# Patient Record
Sex: Female | Born: 1980
Health system: Southern US, Community
[De-identification: ages and names within clinical notes are randomized; demographics above are authoritative.]

## PROBLEM LIST (undated history)

## (undated) DIAGNOSIS — F329 Major depressive disorder, single episode, unspecified: Secondary | ICD-10-CM

## (undated) DIAGNOSIS — K319 Disease of stomach and duodenum, unspecified: Secondary | ICD-10-CM

## (undated) DIAGNOSIS — F32A Depression, unspecified: Secondary | ICD-10-CM

## (undated) DIAGNOSIS — F419 Anxiety disorder, unspecified: Secondary | ICD-10-CM

## (undated) HISTORY — PX: ABDOMINAL HYSTERECTOMY: SHX81

## (undated) HISTORY — DX: Anxiety disorder, unspecified: F41.9

---
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
## 2001-05-13 ENCOUNTER — Other Ambulatory Visit: Admission: RE | Admit: 2001-05-13 | Discharge: 2001-05-13 | Payer: Self-pay | Admitting: Obstetrics and Gynecology

## 2002-05-14 ENCOUNTER — Emergency Department (HOSPITAL_COMMUNITY): Admission: EM | Admit: 2002-05-14 | Discharge: 2002-05-14 | Payer: Self-pay | Admitting: Emergency Medicine

## 2003-07-08 ENCOUNTER — Emergency Department (HOSPITAL_COMMUNITY): Admission: EM | Admit: 2003-07-08 | Discharge: 2003-07-09 | Payer: Self-pay | Admitting: Emergency Medicine

## 2003-07-09 ENCOUNTER — Encounter: Payer: Self-pay | Admitting: Emergency Medicine

## 2005-03-23 ENCOUNTER — Ambulatory Visit: Payer: Self-pay | Admitting: Family Medicine

## 2005-04-09 ENCOUNTER — Ambulatory Visit: Payer: Self-pay | Admitting: Family Medicine

## 2005-04-23 ENCOUNTER — Encounter: Admission: RE | Admit: 2005-04-23 | Discharge: 2005-04-23 | Payer: Self-pay | Admitting: Family Medicine

## 2006-08-27 ENCOUNTER — Ambulatory Visit: Payer: Self-pay | Admitting: Family Medicine

## 2006-09-06 ENCOUNTER — Ambulatory Visit: Payer: Self-pay | Admitting: Family Medicine

## 2007-11-22 DIAGNOSIS — H538 Other visual disturbances: Secondary | ICD-10-CM | POA: Insufficient documentation

## 2007-12-01 ENCOUNTER — Ambulatory Visit: Payer: Self-pay | Admitting: Family Medicine

## 2007-12-04 ENCOUNTER — Encounter: Admission: RE | Admit: 2007-12-04 | Discharge: 2007-12-04 | Payer: Self-pay | Admitting: Family Medicine

## 2007-12-05 ENCOUNTER — Telehealth: Payer: Self-pay | Admitting: Family Medicine

## 2008-06-26 ENCOUNTER — Emergency Department (HOSPITAL_COMMUNITY): Admission: EM | Admit: 2008-06-26 | Discharge: 2008-06-27 | Payer: Self-pay | Admitting: Emergency Medicine

## 2008-07-05 ENCOUNTER — Encounter: Admission: RE | Admit: 2008-07-05 | Discharge: 2008-07-05 | Payer: Self-pay | Admitting: Neurology

## 2008-07-08 ENCOUNTER — Encounter: Admission: RE | Admit: 2008-07-08 | Discharge: 2008-07-08 | Payer: Self-pay | Admitting: Internal Medicine

## 2009-08-12 ENCOUNTER — Inpatient Hospital Stay (HOSPITAL_COMMUNITY): Admission: AD | Admit: 2009-08-12 | Discharge: 2009-08-12 | Payer: Self-pay | Admitting: Obstetrics and Gynecology

## 2009-08-12 ENCOUNTER — Encounter (INDEPENDENT_AMBULATORY_CARE_PROVIDER_SITE_OTHER): Payer: Self-pay | Admitting: Obstetrics and Gynecology

## 2010-06-21 ENCOUNTER — Encounter: Admission: RE | Admit: 2010-06-21 | Discharge: 2010-06-22 | Payer: Self-pay | Admitting: Obstetrics and Gynecology

## 2010-09-07 ENCOUNTER — Inpatient Hospital Stay (HOSPITAL_COMMUNITY): Admission: AD | Admit: 2010-09-07 | Discharge: 2010-09-11 | Payer: Self-pay | Admitting: Obstetrics and Gynecology

## 2011-03-15 LAB — RPR: RPR Ser Ql: NONREACTIVE

## 2011-03-15 LAB — GLUCOSE, CAPILLARY
Glucose-Capillary: 109 mg/dL — ABNORMAL HIGH (ref 70–99)
Glucose-Capillary: 121 mg/dL — ABNORMAL HIGH (ref 70–99)
Glucose-Capillary: 176 mg/dL — ABNORMAL HIGH (ref 70–99)
Glucose-Capillary: 99 mg/dL (ref 70–99)

## 2011-03-15 LAB — CBC
MCV: 95.8 fL (ref 78.0–100.0)
Platelets: 146 10*3/uL — ABNORMAL LOW (ref 150–400)
RBC: 2.85 MIL/uL — ABNORMAL LOW (ref 3.87–5.11)
RBC: 4 MIL/uL (ref 3.87–5.11)
RDW: 13.3 % (ref 11.5–15.5)
RDW: 13.7 % (ref 11.5–15.5)
WBC: 12.7 10*3/uL — ABNORMAL HIGH (ref 4.0–10.5)
WBC: 9.3 10*3/uL (ref 4.0–10.5)

## 2011-03-15 LAB — RH IMMUNE GLOB WKUP(>/=20WKS)(NOT WOMEN'S HOSP): Antibody Screen: NEGATIVE

## 2011-04-08 LAB — RH IMMUNE GLOBULIN WORKUP (NOT WOMEN'S HOSP)
ABO/RH(D): O NEG
Antibody Screen: NEGATIVE

## 2011-04-08 LAB — POCT PREGNANCY, URINE: Preg Test, Ur: NEGATIVE

## 2011-08-22 ENCOUNTER — Ambulatory Visit (INDEPENDENT_AMBULATORY_CARE_PROVIDER_SITE_OTHER): Payer: Self-pay | Admitting: Family Medicine

## 2011-08-22 ENCOUNTER — Other Ambulatory Visit: Payer: Self-pay | Admitting: Family Medicine

## 2011-08-22 ENCOUNTER — Encounter: Payer: Self-pay | Admitting: Family Medicine

## 2011-08-22 ENCOUNTER — Ambulatory Visit
Admission: RE | Admit: 2011-08-22 | Discharge: 2011-08-22 | Disposition: A | Discharge status: Home / Self Care | Payer: PRIVATE HEALTH INSURANCE | Source: Ambulatory Visit | Attending: Family Medicine | Admitting: Family Medicine

## 2011-08-22 DIAGNOSIS — G8929 Other chronic pain: Secondary | ICD-10-CM | POA: Insufficient documentation

## 2011-08-22 DIAGNOSIS — R1011 Right upper quadrant pain: Secondary | ICD-10-CM

## 2011-08-22 LAB — BASIC METABOLIC PANEL
BUN: 18 mg/dL (ref 6–23)
CO2: 28 mEq/L (ref 19–32)
Calcium: 9.6 mg/dL (ref 8.4–10.5)
Creatinine, Ser: 1 mg/dL (ref 0.4–1.2)
GFR: 65.48 mL/min (ref >60.00)
Glucose, Bld: 98 mg/dL (ref 70–99)

## 2011-08-22 LAB — CBC WITH DIFFERENTIAL/PLATELET
Basophils Relative: 0.5 % (ref 0.0–3.0)
Eosinophils Absolute: 0.3 10*3/uL (ref 0.0–0.7)
Eosinophils Relative: 4 % (ref 0.0–5.0)
Hemoglobin: 14.1 g/dL (ref 12.0–15.0)
Lymphocytes Relative: 20.1 % (ref 12.0–46.0)
MCHC: 33.9 g/dL (ref 30.0–36.0)
Neutro Abs: 5 10*3/uL (ref 1.4–7.7)
Neutrophils Relative %: 69.9 % (ref 43.0–77.0)
RBC: 4.49 Mil/uL (ref 3.87–5.11)
WBC: 7.1 10*3/uL (ref 4.5–10.5)

## 2011-08-22 LAB — LIPASE: Lipase: 38 U/L (ref 11.0–59.0)

## 2011-08-22 LAB — HEPATIC FUNCTION PANEL
ALT: 13 U/L (ref 0–35)
AST: 15 U/L (ref 0–37)
Alkaline Phosphatase: 55 U/L (ref 39–117)
Bilirubin, Direct: 0.1 mg/dL (ref 0.0–0.3)
Total Bilirubin: 0.6 mg/dL (ref 0.3–1.2)

## 2011-08-22 NOTE — Progress Notes (Signed)
  Subjective:    Patient ID: Joanne Molina, female    DOB: 02/11/1980, 31 y.o.   MRN: [id number]  Jasmer is a delightful 31 year old, married female, nonsmoker......... Baby is 66 months of age......... Who comes in with a 22-month history of abdominal pain.  Initially, she would get just some bloating, PC over the past couple weeks.  She developed abdominal pain.  She describes the pain is of sudden onset sharp and occurs in the right upper quadrant.  Does not radiate.  She gets nausea and sometimes vomits.  She started a healthy diet postpartum and has lost 50 some, pounds she's down to 143.  She does have white skin light.  Eyes and a positive family history of gallbladder disease.  She can take an OTC Prilosec, with no relief.  No urinary tract symptoms, birth control.  She uses a diaphragm.  Menses normal    Review of Systems    General and GI review of systems otherwise negative Objective:   Physical Exam Well-developed well-nourished, female in no acute distress.  Examination the abdomen is evident.  A flat.  The bowel sounds are normal.  There are scars in the lower abdomen.  Postpartum stretch marks.  There is tenderness right upper quadrant.  No rebound.  No palpable masses       Assessment & Plan:  Right upper quadrant abdominal pain, PCA, most likely, gallbladder disease.  Plan fat-free diet,,,,,,,,,,Evaluation  ASAP

## 2011-08-22 NOTE — Patient Instructions (Signed)
Stay on a complete fat free diet.  Labs today.  We will set you up for an ultrasound of her gallbladder ASAP.  In the meantime, if he develops severe abdominal pain, fever, or anything unusual, come directly to the emergency room

## 2011-08-22 NOTE — Progress Notes (Signed)
Addended by: Bonnye Fava on: 08/22/2011 10:55 AM   Modules accepted: Orders

## 2011-08-23 ENCOUNTER — Telehealth: Payer: Self-pay | Admitting: Family Medicine

## 2011-08-23 NOTE — Telephone Encounter (Signed)
Pt called to inquire about her lab work and ultra sound that was completed yesterday. Please contact when results are available.

## 2011-08-23 NOTE — Telephone Encounter (Signed)
Pt called 8/23. She had labs and abd U/S done yesterday. She would like to know if all the results are back. Please call her.

## 2011-08-24 ENCOUNTER — Encounter (HOSPITAL_COMMUNITY)
Admission: RE | Admit: 2011-08-24 | Discharge: 2011-08-24 | Disposition: A | Discharge status: Home / Self Care | Payer: PRIVATE HEALTH INSURANCE | Source: Ambulatory Visit | Attending: Family Medicine | Admitting: Family Medicine

## 2011-08-24 DIAGNOSIS — R110 Nausea: Secondary | ICD-10-CM | POA: Insufficient documentation

## 2011-08-24 DIAGNOSIS — R1013 Epigastric pain: Secondary | ICD-10-CM | POA: Insufficient documentation

## 2011-08-24 MED ORDER — TECHNETIUM TC 99M MEBROFENIN IV KIT
5.0000 | PACK | Freq: Once | INTRAVENOUS | Status: AC | PRN
  Administered 2011-08-24: 5 via INTRAVENOUS

## 2011-08-27 ENCOUNTER — Other Ambulatory Visit: Payer: Self-pay | Admitting: Family Medicine

## 2011-08-29 ENCOUNTER — Encounter (INDEPENDENT_AMBULATORY_CARE_PROVIDER_SITE_OTHER): Payer: Self-pay | Admitting: General Surgery

## 2011-08-30 ENCOUNTER — Encounter (INDEPENDENT_AMBULATORY_CARE_PROVIDER_SITE_OTHER): Payer: Self-pay | Admitting: General Surgery

## 2011-08-30 ENCOUNTER — Ambulatory Visit (INDEPENDENT_AMBULATORY_CARE_PROVIDER_SITE_OTHER): Payer: PRIVATE HEALTH INSURANCE | Admitting: General Surgery

## 2011-08-30 VITALS — BP 132/88 | HR 76 | Temp 98.0°F | Ht 66.0 in | Wt 138.6 lb

## 2011-08-30 DIAGNOSIS — K8020 Calculus of gallbladder without cholecystitis without obstruction: Secondary | ICD-10-CM

## 2011-08-30 DIAGNOSIS — K8050 Calculus of bile duct without cholangitis or cholecystitis without obstruction: Secondary | ICD-10-CM | POA: Insufficient documentation

## 2011-08-30 NOTE — Progress Notes (Signed)
Subjective:     Patient ID: Joanne Molina, female   DOB: [birth year], 31 y.o.   MRN: [id number]  HPI We are asked to see the patient in consultation by Dr. Alonza Smoker to evaluate her for biliary colic. The patient is a 31 year old white female who has been having progressive epigastric pain for the last 3 months. All foods seem to bring on the pain. She mostly feels that night with worsening of her abdominal pain bloating and lots of belching. She's had a couple episodes of nausea and vomiting. She's had a few episodes of diarrhea as well. As part of her workup she underwent an ultrasound which was negative. She also underwent a HIDA scan which showed a low gallbladder ejection fraction at 16%.Her liver functions amylase and lipase were normal.  Review of Systems  Constitutional: Negative.   HENT: Negative.   Eyes: Negative.   Respiratory: Negative.   Cardiovascular: Negative.   Gastrointestinal: Negative.   Genitourinary: Negative.   Musculoskeletal: Negative.   Skin: Negative.   Neurological: Negative.   Hematological: Negative.   Psychiatric/Behavioral: Negative.    Past Medical History  Diagnosis Date  . Anxiety     panic attack  . Migraine    History reviewed. No pertinent past surgical history.  Current outpatient prescriptions:FLUoxetine (PROZAC) 20 MG tablet, Take 20 mg by mouth daily.  , Disp: , Rfl: ;  loratadine (CLARITIN) 10 MG tablet, Take 10 mg by mouth daily.  , Disp: , Rfl:   Allergies  Allergen Reactions  . Erythromycin     REACTION: Upset GI        Objective:   Physical Exam  Constitutional: [name] oriented to person, place, and time. She appears well-developed and well-nourished.  HENT:  Head: Normocephalic and atraumatic.  Eyes: Conjunctivae and EOM are normal. Pupils are equal, round, and reactive to light.  Neck: Normal range of motion. Neck supple.  Cardiovascular: Normal rate, regular rhythm and normal heart sounds.   Pulmonary/Chest: Effort normal  and breath sounds normal.  Abdominal: Soft. Bowel sounds are normal.  Musculoskeletal: Normal range of motion.  Neurological: [name] alert and oriented to person, place, and time.  Skin: Skin is warm and dry.  Psychiatric: She has a normal mood and affect. Her behavior is normal.       Assessment:     The patient has what sounds like biliary colic because of a low gallbladder ejection fraction. Because of this I think that she may improve with removal of her gallbladder although I cannot guarantee it. I have discussed this with her and her husband in detail. We have talked about the risks and benefits of the operation as well as some of the technical aspects and she understands and would like to proceed.    Plan:     Plan for laparoscopic cholecystectomy with intraoperative cholangiogram

## 2011-08-30 NOTE — Patient Instructions (Signed)
Plan for laparoscopic cholecystectomy with intraoperative cholangiogram 

## 2011-09-17 ENCOUNTER — Telehealth: Payer: Self-pay | Admitting: Family Medicine

## 2011-09-17 MED ORDER — FLUOXETINE HCL 20 MG PO TABS: 20.0000 mg | ORAL_TABLET | Freq: Every day | ORAL | Status: DC

## 2011-09-17 NOTE — Telephone Encounter (Signed)
Refill Fluoxetine 20 mg ----[facility] [place], [place]. Thanks.

## 2011-09-26 ENCOUNTER — Encounter (HOSPITAL_COMMUNITY): Payer: PRIVATE HEALTH INSURANCE

## 2011-09-26 ENCOUNTER — Other Ambulatory Visit (INDEPENDENT_AMBULATORY_CARE_PROVIDER_SITE_OTHER): Payer: Self-pay | Admitting: General Surgery

## 2011-09-26 LAB — CBC
MCH: 30.7 pg (ref 26.0–34.0)
MCV: 89.4 fL (ref 78.0–100.0)
Platelets: 224 10*3/uL (ref 150–400)
RBC: 4.36 MIL/uL (ref 3.87–5.11)

## 2011-10-02 ENCOUNTER — Other Ambulatory Visit (INDEPENDENT_AMBULATORY_CARE_PROVIDER_SITE_OTHER): Payer: Self-pay | Admitting: General Surgery

## 2011-10-02 ENCOUNTER — Ambulatory Visit (HOSPITAL_COMMUNITY)
Admission: RE | Admit: 2011-10-02 | Discharge: 2011-10-02 | Disposition: A | Discharge status: Home / Self Care | Payer: PRIVATE HEALTH INSURANCE | Source: Ambulatory Visit | Attending: General Surgery | Admitting: General Surgery

## 2011-10-02 DIAGNOSIS — K811 Chronic cholecystitis: Secondary | ICD-10-CM | POA: Insufficient documentation

## 2011-10-02 DIAGNOSIS — Z01812 Encounter for preprocedural laboratory examination: Secondary | ICD-10-CM | POA: Insufficient documentation

## 2011-10-02 DIAGNOSIS — Z79899 Other long term (current) drug therapy: Secondary | ICD-10-CM | POA: Insufficient documentation

## 2011-10-02 DIAGNOSIS — K828 Other specified diseases of gallbladder: Secondary | ICD-10-CM

## 2011-10-02 HISTORY — PX: CHOLECYSTECTOMY: SHX55

## 2011-10-03 NOTE — Op Note (Signed)
NAMEVOLANDA, Joanne Molina                 ACCOUNT NO.:  [id number]  MEDICAL RECORD NO.:  [id number]  LOCATION:  DAYL                         FACILITY:  [facility]  PHYSICIAN:  Ollen Gross. Vernell Morgans, M.D. DATE OF BIRTH:  [birth year]  DATE OF PROCEDURE:  10/02/2011 DATE OF DISCHARGE:  10/02/2011                              OPERATIVE REPORT   PREOPERATIVE DIAGNOSIS:  Biliary dyskinesia.  POSTOPERATIVE DIAGNOSIS:  Biliary dyskinesia.  PROCEDURE:  Laparoscopic cholecystectomy.  SURGEON:  Ollen Gross. Vernell Morgans, M.D.  ASSISTANT:  Anselm Pancoast. Zachery Dakins, M.D.  ANESTHESIA:  General endotracheal.  PROCEDURE IN DETAIL:  After informed consent was obtained, the patient was brought to the operating room, placed in the supine position on the operating table.  After adequate induction of general anesthesia, the patient's abdomen was prepped with ChloraPrep, allowed to dry, and draped in the usual sterile manner.  The area below the umbilicus was infiltrated with 0.25% Marcaine.  A small incision was made with 15 blade knife.  This incision was carried down through the subcutaneous tissue bluntly the hemostat and [employer] retractors until linea alba was identified.  Linea alba was incised with a 15-blade knife and each side was grasped with Kocher clamps and elevated anteriorly.  The preperitoneal space was then probed bluntly, hemostat to the peritoneum was opened, access was gained to the abdominal cavity.  A 0 Vicryl purse- string stitch was placed in the fascia around the opening.  A Hasson cannula was placed through the opening, anchored in place the precise 5- 0 Vicryl purse-string stitch.  The abdomen was insufflated with carbon dioxide without difficulty.  The patient was placed in reverse Trendelenburg position and rotated with the right side up.  The laparoscope was inserted through the Hasson cannula and the right upper quadrant was inspected.  Next, the epigastric region was infiltrated with 0.25%  Marcaine.  A small incision was made with a 15 blade knife.  A 10 mm port was then placed bluntly through this incision into the abdominal cavity under direct vision.  Sites were then chosen laterally on the right side of the abdomen for placement of two 5 mm ports.  Each of these areas infiltrated with 0.25% Marcaine.  Small stab incisions were made with a 15 blade knife.  5 mm ports were placed bluntly through these incisions into the abdominal cavity under direct vision.  Blunt grasper was placed the lateral most 5 mm port, used to grasp the dome of gallbladder, and elevated anteriorly and superiorly.  Another blunt grasper was placed through the other 5 mm port and used to retract on the body and neck of the gallbladder.  There were some omental adhesions to the body of the gallbladder, taken down with laparoscopic scissors.  Next, a dissector was placed through the epigastric port and using the electrocautery.  The peritoneal reflection at the gallbladder neck was opened.  Blunt dissection was then carried out in this area until the gallbladder neck cystic duct junction was readily identified and a good window was created.  A single clip was placed on the gallbladder neck. A small ductotomy was made just below the clip with a laparoscopic scissors and  14 gauge Angiocath was placed percutaneously through the anterior abdominal wall under direct vision.  A Reddick cholangiogram catheter was placed through the Angiocath and flushed.  An attempt was made to insert the cholangiogram into the cystic duct, but the duct was very small and would not accept the catheter.  We could see the anatomy very well.  We were well away from the common duct.  We, therefore, decided to abort the cholangiogram since the patient did not have stones.  We placed 3 clips proximally on the cystic duct and the duct was divided between the 2 sets of clips.  Posteriorly, the  cystic artery was identified and  again dissected bluntly in circumferential manner until a good window was created.  Two clips placed proximally and distally on the artery and the artery was divided between the two.  Next, a laparoscopic hook cautery device was used to separate the gallbladder from liver bed prior to completely detaching the gallbladder from liver bed.  The liver bed was inspected and several small bleeding points were coagulated with electrocautery until the area was completely hemostatic.  The gallbladder was then detached rest away from liver bed without difficulty.  A laparoscopic bag was inserted through the epigastric port.  The gallbladder was placed in the bag and bag was sealed.  The abdomen was then irrigated copious amounts of saline to the effluent was clear.  The laparoscope was then moved to the epigastric port.  A gallbladder grasper was placed through the Hasson cannula and used to grasp the open the bag.  The bag with the gallbladder was removed with the Hasson cannula through the infraumbilical port without difficulty.  The fascial defect was closed with the previous placed Vicryl purse-string stitch as well as with another figure-of-eight 0 Vicryl stitch.  The rest of ports removed under direct vision were found to be hemostatic.  The gas was allowed to escape.  The skin incisions were all closed with interrupted 4-0 Monocryl subcuticular stitches. Dermabond dressings were applied.  The patient tolerated the procedure well.  At the end of the case, all needle, sponge, and instrument counts were correct.  The patient was then awakened and taken to the Recovery in stable condition sanitation.     Ollen Gross. Vernell Morgans, M.D.     PST/MEDQ  D:  10/02/2011  T:  10/03/2011  Job:  [id number]  Electronically Signed by Chevis Pretty III M.D. on 10/03/2011 01:00:14 PM

## 2011-10-29 ENCOUNTER — Ambulatory Visit (INDEPENDENT_AMBULATORY_CARE_PROVIDER_SITE_OTHER): Payer: PRIVATE HEALTH INSURANCE | Admitting: General Surgery

## 2011-10-29 ENCOUNTER — Encounter (INDEPENDENT_AMBULATORY_CARE_PROVIDER_SITE_OTHER): Payer: Self-pay | Admitting: General Surgery

## 2011-10-29 VITALS — BP 114/78 | HR 66 | Temp 97.2°F | Resp 14 | Ht 66.0 in | Wt 133.8 lb

## 2011-10-29 DIAGNOSIS — K8020 Calculus of gallbladder without cholecystitis without obstruction: Secondary | ICD-10-CM

## 2011-10-29 DIAGNOSIS — K8050 Calculus of bile duct without cholangitis or cholecystitis without obstruction: Secondary | ICD-10-CM

## 2011-10-29 NOTE — Patient Instructions (Signed)
May return to all normal activities Avoid heavy lifting for a couple more weeks

## 2011-10-29 NOTE — Progress Notes (Signed)
Subjective:     Patient ID: Joanne Molina, female   DOB: 1980/03/16, 31 y.o.   MRN: [id number]  HPI The patient is a 31 year old white female who is now couple weeks out from a laparoscopic cholecystectomy for cholecystitis. She feels much better now than she did before surgery. Her appetite is good her bowels are working normally.  Review of Systems     Objective:   Physical Exam On exam her abdomen is soft and nontender. Her incisions are healing nicely.    Assessment:     2 weeks status post laparoscopic cholecystectomy    Plan:     At this point I believe she can begin returning to her normal activities without any restrictions. I would like her not to lift anything heavy or do sit-ups for at least 2 or 3 more weeks. Otherwise will plan to see her back on a p.r.n. basis

## 2012-06-27 ENCOUNTER — Emergency Department (HOSPITAL_COMMUNITY)
Admission: EM | Admit: 2012-06-27 | Discharge: 2012-06-27 | Disposition: A | Discharge status: Home / Self Care | Payer: PRIVATE HEALTH INSURANCE | Source: Home / Self Care | Attending: Emergency Medicine | Admitting: Emergency Medicine

## 2012-06-27 ENCOUNTER — Emergency Department (INDEPENDENT_AMBULATORY_CARE_PROVIDER_SITE_OTHER): Payer: PRIVATE HEALTH INSURANCE

## 2012-06-27 ENCOUNTER — Encounter (HOSPITAL_COMMUNITY): Payer: Self-pay

## 2012-06-27 DIAGNOSIS — K269 Duodenal ulcer, unspecified as acute or chronic, without hemorrhage or perforation: Secondary | ICD-10-CM

## 2012-06-27 LAB — CBC WITH DIFFERENTIAL/PLATELET
Basophils Absolute: 0 10*3/uL (ref 0.0–0.1)
Eosinophils Relative: 2 % (ref 0–5)
HCT: 42.6 % (ref 36.0–46.0)
Hemoglobin: 15.4 g/dL — ABNORMAL HIGH (ref 12.0–15.0)
Lymphocytes Relative: 18 % (ref 12–46)
Lymphs Abs: 1.7 10*3/uL (ref 0.7–4.0)
MCV: 87.5 fL (ref 78.0–100.0)
Monocytes Absolute: 0.5 10*3/uL (ref 0.1–1.0)
Monocytes Relative: 5 % (ref 3–12)
RDW: 12.1 % (ref 11.5–15.5)
WBC: 9.2 10*3/uL (ref 4.0–10.5)

## 2012-06-27 LAB — COMPREHENSIVE METABOLIC PANEL
ALT: 15 U/L (ref 0–35)
Alkaline Phosphatase: 49 U/L (ref 39–117)
BUN: 15 mg/dL (ref 6–23)
CO2: 26 mEq/L (ref 19–32)
Chloride: 101 mEq/L (ref 96–112)
GFR calc Af Amer: >90 mL/min (ref >90)
GFR calc non Af Amer: 80 mL/min — ABNORMAL LOW (ref >90)
Glucose, Bld: 81 mg/dL (ref 70–99)
Potassium: 4.4 mEq/L (ref 3.5–5.1)
Total Bilirubin: 0.7 mg/dL (ref 0.3–1.2)

## 2012-06-27 MED ORDER — PANTOPRAZOLE SODIUM 40 MG PO TBEC: 40.0000 mg | DELAYED_RELEASE_TABLET | Freq: Two times a day (BID) | ORAL | Status: DC

## 2012-06-27 MED ORDER — SUCRALFATE 1 GM/10ML PO SUSP: 1.0000 g | Freq: Four times a day (QID) | ORAL | Status: DC

## 2012-06-27 NOTE — ED Notes (Addendum)
GB removed 10-2011, and since then, she had that , she states she has lost ~50 LBS; "this is the thinnest I have been since high school" c/o she feels bloated all the time, and has eaten 3 meals in 3 days; pain is epigastric to low back; looks sick; states  She has an appt to see Dr Tawanna Cooler on Monday, but did not want to wait until then; no relief w prilosec or gas-x

## 2012-06-27 NOTE — ED Provider Notes (Signed)
Chief Complaint  Patient presents with  . Abdominal Pain    History of Present Illness:    The patient is a 32 year old female who has had a one-month history of recurring epigastric pain. This initially was intermittent but has become more constant. She describes it as a dull ache it is located in the epigastric pain and left upper quadrant with some radiation to the back. Her abdomen feels bloated. She doesn't have much of an appetite and she has lost 50 pounds since her cholecystectomy last October. The pain is worse at nighttime, when eating any foods, and even with water. It's better for just a few minutes after antacids. She's had nausea but no vomiting. She denies blood in stools, or black stools. She's had no urinary symptoms or GYN complaints. She denies any history of ulcer disease. She had a cholecystectomy last October has had alternating diarrhea and constipation since then. She has tried Prilosec without relief.  Review of Systems:  Other than noted above, the patient denies any of the following symptoms: Constitutional:  No fever, chills, fatigue, weight loss or anorexia. Lungs:  No cough or shortness of breath. Heart:  No chest pain, palpitations, syncope or edema.  No cardiac history. Abdomen:  No nausea, vomiting, hematememesis, melena, diarrhea, or hematochezia. GU:  No dysuria, frequency, urgency, or hematuria. Gyn:  No vaginal discharge, itching, abnormal bleeding, dyspareunia, or pelvic pain. Skin:  No rash or itching.  PMFSH:  Past medical history, family history, social history, meds, and allergies were reviewed along with nurse's notes.  No prior abdominal surgeries, past history of GI problems, STDs or GYN problems.  No history of aspirin or NSAID use.  No excessive alcohol intake.  Physical Exam:   Vital signs:  BP 128/89  Pulse 87  Temp 98.3 F (36.8 C) (Oral)  Resp 12  SpO2 98%  LMP 06/12/2012 Gen:  Alert, oriented, in no distress. Lungs:  Breath sounds clear  and equal bilaterally.  No wheezes, rales or rhonchi. Heart:  Regular rhythm.  No gallops or murmers.   Abdomen:  Abdomen was soft, flat, nondistended. She has mild epigastric tenderness to palpation without guarding or rebound. No organomegaly or mass. Bowel sounds are normally active. Skin:  Clear, warm and dry.  No rash.  Labs:   Results for orders placed during the hospital encounter of 06/27/12  CBC WITH DIFFERENTIAL      Component Value Range   WBC 9.2  4.0 - 10.5 K/uL   RBC 4.87  3.87 - 5.11 MIL/uL   Hemoglobin 15.4 (*) 12.0 - 15.0 g/dL   HCT 16.1  09.6 - 04.5 %   MCV 87.5  78.0 - 100.0 fL   MCH 31.6  26.0 - 34.0 pg   MCHC 36.2 (*) 30.0 - 36.0 g/dL   RDW 40.9  81.1 - 91.4 %   Platelets 210  150 - 400 K/uL   Neutrophils Relative 75  43 - 77 %   Neutro Abs 6.9  1.7 - 7.7 K/uL   Lymphocytes Relative 18  12 - 46 %   Lymphs Abs 1.7  0.7 - 4.0 K/uL   Monocytes Relative 5  3 - 12 %   Monocytes Absolute 0.5  0.1 - 1.0 K/uL   Eosinophils Relative 2  0 - 5 %   Eosinophils Absolute 0.2  0.0 - 0.7 K/uL   Basophils Relative 0  0 - 1 %   Basophils Absolute 0.0  0.0 - 0.1 K/uL  LIPASE,  BLOOD      Component Value Range   Lipase 32  11 - 59 U/L  POCT H PYLORI SCREEN      Component Value Range   H. PYLORI SCREEN, POC NEGATIVE  NEGATIVE  COMPREHENSIVE METABOLIC PANEL      Component Value Range   Sodium 139  135 - 145 mEq/L   Potassium 4.4  3.5 - 5.1 mEq/L   Chloride 101  96 - 112 mEq/L   CO2 26  19 - 32 mEq/L   Glucose, Bld 81  70 - 99 mg/dL   BUN 15  6 - 23 mg/dL   Creatinine, Ser 1.61  0.50 - 1.10 mg/dL   Calcium 09.6  8.4 - 04.5 mg/dL   Total Protein 8.1  6.0 - 8.3 g/dL   Albumin 4.9  3.5 - 5.2 g/dL   AST 14  0 - 37 U/L   ALT 15  0 - 35 U/L   Alkaline Phosphatase 49  39 - 117 U/L   Total Bilirubin 0.7  0.3 - 1.2 mg/dL   GFR calc non Af Amer 80 (*) >90 mL/min   GFR calc Af Amer >90  >90 mL/min     Radiology:  Dg Abd Acute W/chest  06/27/2012  *RADIOLOGY REPORT*  Clinical  Data: Epigastric pain.  Weight loss  ACUTE ABDOMEN SERIES (ABDOMEN 2 VIEW & CHEST 1 VIEW)  Comparison: None.  Findings: Chest:  Heart size and vascularity are normal.  Lungs are clear without infiltrate or effusion.  Abdomen:  Surgical clips in the gallbladder region.  Negative for bowel obstruction.  Negative for free air.  Ingested capsule are present in the bowel.  No renal calculi.  IMPRESSION: No active disease in the chest or abdomen.  Original Report Authenticated By: Camelia Phenes, M.D.    Assessment:  The encounter diagnosis was Duodenal ulcer.  Plan:   1.  The following meds were prescribed:   New Prescriptions   PANTOPRAZOLE (PROTONIX) 40 MG TABLET    Take 1 tablet (40 mg total) by mouth 2 (two) times daily before a meal.   SUCRALFATE (CARAFATE) 1 GM/10ML SUSPENSION    Take 10 mLs (1 g total) by mouth 4 (four) times daily.   2.  The patient was instructed in symptomatic care and handouts were given. 3.  The patient was told to return if becoming worse in any way, if no better in 3 or 4 days, and given some red flag symptoms that would indicate earlier   Follow up:  The patient was told to follow up with Dr. Wandalee Ferdinand in 2 weeks.   Reuben Likes, MD 06/27/12 [phone]

## 2012-06-27 NOTE — Discharge Instructions (Signed)
Ulcer Disease You have an ulcer. This may be in your stomach (gastric ulcer) or in the first part of your small bowel, the duodenum (duodenal ulcer). An ulcer is a break in the lining of the stomach or duodenum. The ulcer causes erosion into the deeper tissue. CAUSES  The stomach has a lining to protect itself from the acid that digests food. The lining can be damaged in two main ways:  The Helico Pylori bacteria (H. Pyolori) can infect the lining of the stomach and cause ulcers.   Nonsteroidal, anti-inflammatory medications (NSAIDS) can cause gastric ulcerations.   Smoking tobacco can increase the acid in the stomach. This can lead to ulcers, and will impair healing of ulcers.  Other factors, such as alcohol use and stress may contribute to ulcer formation. Rarely, a tumor or cancer can cause an ulcer.  SYMPTOMS  The problems (symptoms) of ulcer disease are usually a burning or gnawing of the mid-upper belly (abdomen). This is often worse on an empty stomach and may get better with food. This may be associated with feeling sick to your stomach (nausea), bloating, and vomiting. If the ulcer results in bleeding, it can cause:  Black, tarry stools.   Vomiting of bright red blood.   Vomiting coffee-ground-looking materials.  With severe bleeding, there may be loss of consciousness and shock.  DIAGNOSIS  Learning what is wrong (diagnosis) is usually made based upon your history and an exam. Medications are so effective that further tests may not be necessary. If needed, other tests may include:  Blood tests, x-rays, or heart tests. These are done to be sure that no other conditions are causing your symptoms.   X-rays (Barium studies) such as an upper GI series.   Most commonly, an upper GI endoscopy can confirm your diagnosis. This is when a flexible tube is passed through the mouth (using sedative medication). The tube is used to look at the inside of esophagus, stomach, and small bowel.  Abnormal pieces of tissue may be removed to examine under the microscope (biopsy).  TREATMENT  Bleeding from ulcers can usually be treated via endoscopy. Rarely, surgery is needed for ulcers when bleeding cannot be stopped. Surgery is needed if the ulcer goes through the wall of the stomach or duodenum.  After any bleeding is stopped, medications are the main treatment:  If your ulcer was caused by the bacteria H. Pylori, then you will need antibiotics to kill the infection. Usually, a program involving more than one antibiotic, and a medicine for stomach acid, is prescribed.   Medicine to decrease acid production is used for almost all ulcers. Your caregiver will make a recommendation. They will also tell you how long you must use the medication.   Stop using any medications or substances that may have contributed to your ulcer (alcohol, tobacco, caffeine, for example).   Medications are available that protect the lining of the bowel.  HOME CARE INSTRUCTIONS   Continue regular work and usual activities unless advised otherwise by your caregiver.   Avoid tobacco, alcohol, and caffeine. Tobacco use will decrease and slow the rates of healing.   Avoid foods that seem to aggravate or cause discomfort.   There are many over-the-counter products available to control stomach acid and other symptoms. Discuss these with your caregiver before using them. DO NOT substitute over-the-counter medications for prescription medications without discussing with your caregiver.   Special diets are not usually needed.   Keep any follow-up appointments and blood tests, as   directed.  SEEK MEDICAL CARE IF:   Your pain or other ulcer symptoms do not improve within a few days of starting treatment.   You develop diarrhea. This can be a complication of certain treatments.   You have ongoing indigestion or heartburn, even if your main ulcer symptoms are improved.  SEEK IMMEDIATE MEDICAL CARE IF:   You develop  bright red, rectal bleeding; dark black, tarry stools; or vomit blood.   You become light-headed, weak, have fainting episodes, or become sweaty, cold and clammy.   You experience severe abdominal pain not controlled by medications. DO NOT take pain medications unless ordered by your caregiver.  Document Released: 09/26/2005 Document Revised: 12/06/2011 Document Reviewed: 08/14/2007 [facility] Patient Information 2012 [place], [place].   Diet for GERD or PUD Nutrition therapy can help ease the discomfort of gastroesophageal reflux disease (GERD) and peptic ulcer disease (PUD).  HOME CARE INSTRUCTIONS   Eat your meals slowly, in a relaxed setting.   Eat 5 to 6 small meals per day.   If a food causes distress, stop eating it for a period of time.  FOODS TO AVOID  Coffee, regular or decaffeinated.   Cola beverages, regular or low calorie.   Tea, regular or decaffeinated.   Pepper.   Cocoa.   High fat foods, including meats.   Butter, margarine, hydrogenated oil (trans fats).   Peppermint or spearmint (if you have GERD).   Fruits and vegetables if not tolerated.   Alcohol.   Nicotine (smoking or chewing). This is one of the most potent stimulants to acid production in the gastrointestinal tract.   Any food that seems to aggravate your condition.  If you have questions regarding your diet, ask your caregiver or a registered dietitian. TIPS  Lying flat may make symptoms worse. Keep the head of your bed raised 6 to 9 inches (15 to 23 cm) by using a foam wedge or blocks under the legs of the bed.   Do not lay down until 3 hours after eating a meal.   Daily physical activity may help reduce symptoms.  MAKE SURE YOU:   Understand these instructions.   Will watch your condition.   Will get help right away if you are not doing well or get worse.  Document Released: 12/17/2005 Document Revised: 12/06/2011 Document Reviewed: 11/02/2011 [facility] Patient Information 2012  [place], [place].

## 2012-06-30 ENCOUNTER — Ambulatory Visit: Payer: PRIVATE HEALTH INSURANCE | Admitting: Family

## 2012-06-30 DIAGNOSIS — Z0289 Encounter for other administrative examinations: Secondary | ICD-10-CM

## 2012-09-09 ENCOUNTER — Encounter: Payer: Self-pay | Admitting: Family Medicine

## 2012-09-09 ENCOUNTER — Ambulatory Visit (INDEPENDENT_AMBULATORY_CARE_PROVIDER_SITE_OTHER): Payer: BC Managed Care – PPO | Admitting: Family Medicine

## 2012-09-09 VITALS — BP 120/80 | Temp 98.0°F | Wt 122.0 lb

## 2012-09-09 DIAGNOSIS — R350 Frequency of micturition: Secondary | ICD-10-CM

## 2012-09-09 DIAGNOSIS — R300 Dysuria: Secondary | ICD-10-CM

## 2012-09-09 DIAGNOSIS — Z23 Encounter for immunization: Secondary | ICD-10-CM

## 2012-09-09 LAB — POCT URINALYSIS DIPSTICK
Blood, UA: NEGATIVE
Leukocytes, UA: NEGATIVE
Nitrite, UA: NEGATIVE
Protein, UA: NEGATIVE
pH, UA: 6

## 2012-09-09 NOTE — Progress Notes (Signed)
  Subjective:    Patient ID: Joanne Molina, female    DOB: May 02, 1980, 32 y.o.   MRN: [id number]  HPI Joanne Molina is a 32 year old married female G1 P62 with a 16-year-old daughter who is going to school at [employer]. who comes in today for evaluation of urinary tract symptoms  Ten-day she did ago she developed urinary tract symptoms with pressure mid pubic pain and frequency of urination. She had no fever chills or back pain. She went to the student clinic urinalysis was normal however she was given Cipro 500 twice a day for one week she took the medication her symptoms not improve. LMP 2 half weeks ago normal she typically uses a diaphragm for birth control however at she's not used her diaphragm for over 6 weeks.   Review of Systems General and GI and neurologic review of systems otherwise negative    Objective:   Physical Exam Well-developed well-nourished female no acute distress examination the abdomen was normal except for diffuse tenderness over the bladder  Pelvic examination external genitalia within normal limits vaginal vault was normal urethra appears normal speculum exam normal cervix and vagina bimanual exam normal uterus and ovaries  Skin exam normal  Urinalysis normal       Assessment & Plan:  Urinary tract symptoms without infection question interstitial cystitis plan neurologic consult ASAP

## 2012-09-09 NOTE — Patient Instructions (Signed)
Call the urology Center and asked to see Dr. Jeannett Senior dalsteadt ASAP

## 2012-11-02 ENCOUNTER — Emergency Department (HOSPITAL_COMMUNITY)
Admission: EM | Admit: 2012-11-02 | Discharge: 2012-11-02 | Disposition: A | Discharge status: Home / Self Care | Payer: BC Managed Care – PPO | Attending: Emergency Medicine | Admitting: Emergency Medicine

## 2012-11-02 ENCOUNTER — Emergency Department (HOSPITAL_COMMUNITY): Payer: BC Managed Care – PPO

## 2012-11-02 DIAGNOSIS — S40022A Contusion of left upper arm, initial encounter: Secondary | ICD-10-CM

## 2012-11-02 DIAGNOSIS — S40029A Contusion of unspecified upper arm, initial encounter: Secondary | ICD-10-CM | POA: Diagnosis not present

## 2012-11-02 DIAGNOSIS — Y939 Activity, unspecified: Secondary | ICD-10-CM | POA: Diagnosis not present

## 2012-11-02 DIAGNOSIS — F411 Generalized anxiety disorder: Secondary | ICD-10-CM | POA: Insufficient documentation

## 2012-11-02 DIAGNOSIS — S139XXA Sprain of joints and ligaments of unspecified parts of neck, initial encounter: Secondary | ICD-10-CM | POA: Insufficient documentation

## 2012-11-02 DIAGNOSIS — S161XXA Strain of muscle, fascia and tendon at neck level, initial encounter: Secondary | ICD-10-CM

## 2012-11-02 DIAGNOSIS — Z79899 Other long term (current) drug therapy: Secondary | ICD-10-CM | POA: Insufficient documentation

## 2012-11-02 DIAGNOSIS — S4980XA Other specified injuries of shoulder and upper arm, unspecified arm, initial encounter: Secondary | ICD-10-CM | POA: Diagnosis present

## 2012-11-02 MED ORDER — METHOCARBAMOL 500 MG PO TABS: 500.0000 mg | ORAL_TABLET | Freq: Two times a day (BID) | ORAL | Status: DC

## 2012-11-02 MED ORDER — KETOROLAC TROMETHAMINE 60 MG/2ML IM SOLN
60.0000 mg | Freq: Once | INTRAMUSCULAR | Status: DC
  Filled 2012-11-02: qty 2

## 2012-11-02 MED ORDER — NAPROXEN 500 MG PO TABS: 500.0000 mg | ORAL_TABLET | Freq: Two times a day (BID) | ORAL | Status: DC
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
## 2013-03-17 ENCOUNTER — Encounter: Payer: BC Managed Care – PPO | Admitting: Family Medicine

## 2013-03-17 NOTE — Progress Notes (Signed)
No show or late cancel  This encounter was created in error - please disregard. 

## 2013-03-20 ENCOUNTER — Encounter: Payer: BC Managed Care – PPO | Admitting: Family Medicine

## 2013-03-20 DIAGNOSIS — Z0289 Encounter for other administrative examinations: Secondary | ICD-10-CM

## 2013-03-20 NOTE — Progress Notes (Signed)
No show. Late cancel.  This encounter was created in error - please disregard.

## 2013-06-12 ENCOUNTER — Ambulatory Visit (INDEPENDENT_AMBULATORY_CARE_PROVIDER_SITE_OTHER): Payer: BC Managed Care – PPO | Admitting: Internal Medicine

## 2013-06-12 ENCOUNTER — Encounter: Payer: Self-pay | Admitting: Internal Medicine

## 2013-06-12 VITALS — BP 112/70 | HR 80 | Temp 98.4°F | Resp 18 | Wt 121.0 lb

## 2013-06-12 DIAGNOSIS — R197 Diarrhea, unspecified: Secondary | ICD-10-CM

## 2013-06-12 NOTE — Patient Instructions (Signed)
Mylanta 2 or Maalox plus 2 tablespoons 4 times daily  High fiber diet  Avoids foods high in acid such as tomatoes citrus juices, and spicy foods.  Avoid eating within two hours of lying down or before exercising.  Do not overheat.  Try smaller more frequent meals.    Call or return to clinic prn if these symptoms worsen or fail to improve as anticipated.  Restora  One daily

## 2013-06-12 NOTE — Progress Notes (Signed)
Subjective:    Patient ID: Joanne Molina, female    DOB: 1980-09-26, 33 y.o.   MRN: [id number]  HPI  33 year old patient who presents with a one-month history of abdominal bloating gaseousness mild nausea and loose stools. No increased stool frequency but still has a soft liquidy consistency. No vomiting. Mild abdominal discomfort but no significant pain. [name] status post cholecystectomy about 2 years ago. No recent antibiotic use but was a given a prescription for mention it is all I dermatology yesterday. She has taken only a single dose. She has a history of allergic rhinitis. No weight loss.  She states her symptoms are best when she first awakes in the morning but do to bloating and associated abdominal discomfort has had some sleep disturbance.  Past Medical History  Diagnosis Date  . Anxiety     panic attack  . Migraine     History   Social History  . Marital Status: Married    Spouse Name: N/A    Number of Children: N/A  . Years of Education: N/A   Occupational History  . Not on file.   Social History Main Topics  . Smoking status: Never Smoker   . Smokeless tobacco: Never Used  . Alcohol Use: No  . Drug Use: No  . Sexually Active: Not on file   Other Topics Concern  . Not on file   Social History Narrative  . No narrative on file    Past Surgical History  Procedure Laterality Date  . Cholecystectomy  10/02/11    Family History  Problem Relation Age of Onset  . Diabetes Mother   . Hyperlipidemia Mother   . Heart disease Mother   . Diabetes Father   . Hyperlipidemia Father   . Heart disease Father   . Cancer Maternal Grandmother     colon and breast  . Cancer Paternal Grandmother     colon    Allergies  Allergen Reactions  . Erythromycin     REACTION: Upset GI    Current Outpatient Prescriptions on File Prior to Visit  Medication Sig Dispense Refill  . FLUoxetine (PROZAC) 20 MG tablet Take 20 mg by mouth daily.      . fluticasone (FLONASE) 50  MCG/ACT nasal spray Place 2 sprays into the nose daily.      Marland Kitchen loratadine (CLARITIN) 10 MG tablet Take 10 mg by mouth daily.         No current facility-administered medications on file prior to visit.    BP 112/70  Pulse 80  Temp(Src) 98.4 F (36.9 C) (Oral)  Resp 18  Wt 121 lb (54.885 kg)  BMI 19.54 kg/m2  LMP 06/05/2013       Review of Systems  Constitutional: Negative.   HENT: Negative for hearing loss, congestion, sore throat, rhinorrhea, dental problem, sinus pressure and tinnitus.   Eyes: Negative for pain, discharge and visual disturbance.  Respiratory: Negative for cough and shortness of breath.   Cardiovascular: Negative for chest pain, palpitations and leg swelling.  Gastrointestinal: Positive for nausea, abdominal pain, diarrhea and abdominal distention. Negative for vomiting, constipation and blood in stool.  Genitourinary: Negative for dysuria, urgency, frequency, hematuria, flank pain, vaginal bleeding, vaginal discharge, difficulty urinating, vaginal pain and pelvic pain.  Musculoskeletal: Negative for joint swelling, arthralgias and gait problem.  Skin: Negative for rash.  Neurological: Negative for dizziness, syncope, speech difficulty, weakness, numbness and headaches.  Hematological: Negative for adenopathy.  Psychiatric/Behavioral: Negative for behavioral problems, dysphoric mood  and agitation. The patient is not nervous/anxious.        Objective:   Physical Exam  Constitutional: [name] oriented to person, place, and time. She appears well-developed and well-nourished.  HENT:  Head: Normocephalic.  Right Ear: External ear normal.  Left Ear: External ear normal.  Mouth/Throat: Oropharynx is clear and moist.  Eyes: Conjunctivae and EOM are normal. Pupils are equal, round, and reactive to light.  Neck: Normal range of motion. Neck supple. No thyromegaly present.  Cardiovascular: Normal rate, regular rhythm, normal heart sounds and intact distal pulses.    Pulmonary/Chest: Effort normal and breath sounds normal.  Abdominal: Soft. Bowel sounds are normal. She exhibits no distension and no mass. There is no tenderness. There is no rebound and no guarding.  Musculoskeletal: Normal range of motion.  Lymphadenopathy:    She has no cervical adenopathy.  Neurological: [name] alert and oriented to person, place, and time.  Skin: Skin is warm and dry. No rash noted.  Psychiatric: She has a normal mood and affect. Her behavior is normal.          Assessment & Plan:   Abdominal bloating with excessive gaseousness/diarrhea. Symptoms of one month's duration.  We'll treat with antacids with simethicone high fiber diet and also with a probiotic. Will call if unimproved

## 2013-06-19 ENCOUNTER — Encounter (HOSPITAL_COMMUNITY): Payer: Self-pay | Admitting: *Deleted

## 2013-06-19 ENCOUNTER — Emergency Department (HOSPITAL_COMMUNITY)
Admission: EM | Admit: 2013-06-19 | Discharge: 2013-06-20 | Disposition: A | Discharge status: Home / Self Care | Payer: BC Managed Care – PPO | Attending: Emergency Medicine | Admitting: Emergency Medicine

## 2013-06-19 DIAGNOSIS — R141 Gas pain: Secondary | ICD-10-CM | POA: Insufficient documentation

## 2013-06-19 DIAGNOSIS — R197 Diarrhea, unspecified: Secondary | ICD-10-CM | POA: Insufficient documentation

## 2013-06-19 DIAGNOSIS — Z79899 Other long term (current) drug therapy: Secondary | ICD-10-CM | POA: Insufficient documentation

## 2013-06-19 DIAGNOSIS — R143 Flatulence: Secondary | ICD-10-CM | POA: Insufficient documentation

## 2013-06-19 DIAGNOSIS — Z8679 Personal history of other diseases of the circulatory system: Secondary | ICD-10-CM | POA: Insufficient documentation

## 2013-06-19 DIAGNOSIS — R112 Nausea with vomiting, unspecified: Secondary | ICD-10-CM | POA: Insufficient documentation

## 2013-06-19 DIAGNOSIS — IMO0002 Reserved for concepts with insufficient information to code with codable children: Secondary | ICD-10-CM | POA: Insufficient documentation

## 2013-06-19 DIAGNOSIS — F410 Panic disorder [episodic paroxysmal anxiety] without agoraphobia: Secondary | ICD-10-CM | POA: Insufficient documentation

## 2013-06-19 DIAGNOSIS — R142 Eructation: Secondary | ICD-10-CM | POA: Insufficient documentation

## 2013-06-19 DIAGNOSIS — K2970 Gastritis, unspecified, without bleeding: Secondary | ICD-10-CM | POA: Insufficient documentation

## 2013-06-19 DIAGNOSIS — Z3202 Encounter for pregnancy test, result negative: Secondary | ICD-10-CM | POA: Insufficient documentation

## 2013-06-19 DIAGNOSIS — Z9089 Acquired absence of other organs: Secondary | ICD-10-CM | POA: Insufficient documentation

## 2013-06-19 LAB — COMPREHENSIVE METABOLIC PANEL
Albumin: 4.2 g/dL (ref 3.5–5.2)
BUN: 16 mg/dL (ref 6–23)
Creatinine, Ser: 0.82 mg/dL (ref 0.50–1.10)
GFR calc Af Amer: >90 mL/min (ref >90)
Total Protein: 7.1 g/dL (ref 6.0–8.3)

## 2013-06-19 LAB — URINE MICROSCOPIC-ADD ON

## 2013-06-19 LAB — URINALYSIS, ROUTINE W REFLEX MICROSCOPIC
Ketones, ur: NEGATIVE mg/dL
Nitrite: NEGATIVE
Specific Gravity, Urine: 1.029 (ref 1.005–1.030)
Urobilinogen, UA: 0.2 mg/dL (ref 0.0–1.0)
pH: 5.5 (ref 5.0–8.0)

## 2013-06-19 LAB — CBC WITH DIFFERENTIAL/PLATELET
Basophils Relative: 1 % (ref 0–1)
Eosinophils Absolute: 0.2 10*3/uL (ref 0.0–0.7)
Eosinophils Relative: 2 % (ref 0–5)
HCT: 37.7 % (ref 36.0–46.0)
Hemoglobin: 13.3 g/dL (ref 12.0–15.0)
MCH: 31 pg (ref 26.0–34.0)
MCHC: 35.3 g/dL (ref 30.0–36.0)
MCV: 87.9 fL (ref 78.0–100.0)
Monocytes Absolute: 0.3 10*3/uL (ref 0.1–1.0)
Monocytes Relative: 5 % (ref 3–12)

## 2013-06-19 LAB — LIPASE, BLOOD: Lipase: 39 U/L (ref 11–59)

## 2013-06-19 MED ORDER — SUCRALFATE 1 G PO TABS: 1.0000 g | ORAL_TABLET | Freq: Four times a day (QID) | ORAL | Status: DC

## 2013-06-19 MED ORDER — SUCRALFATE 1 G PO TABS
1.0000 g | ORAL_TABLET | Freq: Once | ORAL | Status: AC
  Administered 2013-06-20: 1 g via ORAL
  Filled 2013-06-19: qty 1

## 2013-06-19 MED ORDER — HYDROCODONE-ACETAMINOPHEN 5-325 MG PO TABS: 1.0000 | ORAL_TABLET | ORAL | Status: DC | PRN

## 2013-06-19 MED ORDER — PANTOPRAZOLE SODIUM 40 MG IV SOLR
40.0000 mg | Freq: Once | INTRAVENOUS | Status: AC
  Administered 2013-06-19: 40 mg via INTRAVENOUS
  Filled 2013-06-19: qty 40

## 2013-06-19 MED ORDER — FAMOTIDINE IN NACL 20-0.9 MG/50ML-% IV SOLN
20.0000 mg | Freq: Once | INTRAVENOUS | Status: AC
  Administered 2013-06-19: 20 mg via INTRAVENOUS
  Filled 2013-06-19: qty 50

## 2013-06-19 MED ORDER — ONDANSETRON HCL 8 MG PO TABS: 8.0000 mg | ORAL_TABLET | Freq: Three times a day (TID) | ORAL | Status: DC | PRN

## 2013-06-19 MED ORDER — GI COCKTAIL ~~LOC~~
30.0000 mL | Freq: Once | ORAL | Status: AC
  Administered 2013-06-19: 30 mL via ORAL
  Filled 2013-06-19: qty 30

## 2013-06-19 NOTE — Progress Notes (Signed)
33 yo woman who is status post cholecystectomy had been having upper abdominal pain and bloating.  [name] on Nexium which has not been effective.  She asked about gastritis due to reflux of bile as opposed to gastritis due to gastric acid.  I advised her that if medications did not treat her illness successfully she would probably need GI evaluation and upper endoscopy.  This could help diagnose illnesses like ulcer disease caused by H. Pylori, or could document bile gastritis.  Advised her to continue to take Nexium and to add Carafate ac and hs.

## 2013-06-19 NOTE — ED Provider Notes (Signed)
History     CSN: [id number]  Arrival date & time 06/19/13  2034   First MD Initiated Contact with Patient 06/19/13 2042      Chief Complaint  Patient presents with  . Abdominal Pain    (Consider location/radiation/quality/duration/timing/severity/associated sxs/prior treatment) HPI History provided by pt.   Pt presents w/ constant, gradually worsening, severe, epigastric burning, with occasional radiation into chest, that is aggravated by eating and associated w/ N/V/D and abd bloating x 1 month.  Denies fever, hematemesis/hematochezia/melena and GU sx.  Has been taking nexium and maalox w/out relief.  Evaluated by GI yesterday, celiac panel and thyroid studies unremarkable and stool culture pending.  H/o cholecystectomy.  Does not drink alcohol, smoke nor take NSAIDs on a regular basis.   Past Medical History  Diagnosis Date  . Anxiety     panic attack  . Migraine     Past Surgical History  Procedure Laterality Date  . Cholecystectomy  10/02/11    Family History  Problem Relation Age of Onset  . Diabetes Mother   . Hyperlipidemia Mother   . Heart disease Mother   . Diabetes Father   . Hyperlipidemia Father   . Heart disease Father   . Cancer Maternal Grandmother     colon and breast  . Cancer Paternal Grandmother     colon    History  Substance Use Topics  . Smoking status: Never Smoker   . Smokeless tobacco: Never Used  . Alcohol Use: No    OB History   Grav Para Term Preterm Abortions TAB SAB Ect Mult Living                  Review of Systems  All other systems reviewed and are negative.    Allergies  Erythromycin  Home Medications   Current Outpatient Rx  Name  Route  Sig  Dispense  Refill  . alum & mag hydroxide-simeth (MAALOX/MYLANTA) 200-200-20 MG/5ML suspension   Oral   Take 15 mLs by mouth every 6 (six) hours as needed for indigestion.         . Alum Hydroxide-Mag Carbonate (GAVISCON EXTRA STRENGTH) 160-105 MG CHEW   Oral   Chew 1  tablet by mouth daily as needed (for upset stomach).         . Dapsone (ACZONE) 5 % topical gel   Topical   Apply 1 application topically 2 (two) times daily.         Marland Kitchen esomeprazole (NEXIUM) 40 MG capsule   Oral   Take 40 mg by mouth every evening.         Marland Kitchen FLUoxetine (PROZAC) 20 MG capsule   Oral   Take 20 mg by mouth daily.         . fluticasone (FLONASE) 50 MCG/ACT nasal spray   Nasal   Place 2 sprays into the nose daily.         Marland Kitchen loratadine (CLARITIN) 10 MG tablet   Oral   Take 10 mg by mouth daily as needed for allergies.          . metroNIDAZOLE (FLAGYL) 250 MG tablet   Oral   Take 250 mg by mouth daily.            BP 143/83  Pulse 74  Temp(Src) 97.7 F (36.5 C) (Oral)  Resp 16  SpO2 100%  LMP 06/05/2013  Physical Exam  Nursing note and vitals reviewed. Constitutional: [name] oriented to person, place, and time.  She appears well-developed and well-nourished.  Uncomfortable appearing  HENT:  Head: Normocephalic and atraumatic.  Eyes:  Normal appearance  Neck: Normal range of motion.  Cardiovascular: Normal rate and regular rhythm.   Pulmonary/Chest: Effort normal and breath sounds normal. No respiratory distress.  Abdominal: Soft. Bowel sounds are normal. She exhibits no distension and no mass. There is no rebound and no guarding.  Tenderness of epigastrium, RUQ and RLQ.    Genitourinary:  No CVA tenderness  Musculoskeletal: Normal range of motion.  Neurological: [name] alert and oriented to person, place, and time.  Skin: Skin is warm and dry. No rash noted.  Psychiatric: She has a normal mood and affect. Her behavior is normal.    ED Course  Procedures (including critical care time)  Labs Reviewed  URINALYSIS, ROUTINE W REFLEX MICROSCOPIC - Abnormal; Notable for the following:    Color, Urine AMBER (*)    APPearance CLOUDY (*)    Leukocytes, UA SMALL (*)    All other components within normal limits  URINE MICROSCOPIC-ADD ON -  Abnormal; Notable for the following:    Squamous Epithelial / LPF MANY (*)    Bacteria, UA FEW (*)    All other components within normal limits  URINE CULTURE  CBC WITH DIFFERENTIAL  COMPREHENSIVE METABOLIC PANEL  LIPASE, BLOOD  POCT PREGNANCY, URINE   No results found.   1. Gastritis       MDM  33yo healthy F w/ h/o cholecystectomy presents with 1 month gradually worsening epigastric pain and N/V/D.  Has been taking a PPI and maalox w/out relief and was evaluated by GI yesterday.  Celiac panel neg and stool cx pending.  On exam, afebrile, uncomfortable appearing, abd soft/non-distended, epigastric and right-sided abd ttp.  Suspect gastritis vs. Pancreatitis.  Appendicitis unlikely based on duration as well as characteristics of pain.  Labs pending.  Will treat w/ GI cocktail and IV protonix/pepcid and then reassess.    Pain much improved.  Labs unremarkable.  On re-examination, continues to have tenderness in epigastrium and RLQ, though milder.  I still have low suspicion for appendicitis.  Discussed w/ pt.  She seems reliable enough to return for worsening sx.  Prescribed carafate and hydrocodone to supplement nexium, as well as zofran.  She has GI f/u.          Otilio Miu, PA-C 06/20/13 0013

## 2013-06-19 NOTE — ED Notes (Signed)
The pt is c/o a burning inside her body for one month.  She saw her gi doctor yesterday  Where she had tests and other studies.  The results of her lab work has not  Resulted.  Nausea some vomiting.  lmp 4 days ago

## 2013-06-20 NOTE — ED Provider Notes (Signed)
Medical screening examination/treatment/procedure(s) were conducted as a shared visit with non-physician practitioner(s) and myself.  I personally evaluated the patient during the encounter 33 yo woman who is status post cholecystectomy had been having upper abdominal pain and bloating. [name] on Nexium which has not been effective. She asked about gastritis due to reflux of bile as opposed to gastritis due to gastric acid. I advised her that if medications did not treat her illness successfully she would probably need GI evaluation and upper endoscopy. This could help diagnose illnesses like ulcer disease caused by H. Pylori, or could document bile gastritis. Advised her to continue to take Nexium and to add Carafate ac and hs.        Carleene Cooper III, MD 06/20/13 [phone]

## 2013-06-21 LAB — URINE CULTURE: Colony Count: 60000

## 2013-10-30 ENCOUNTER — Encounter (HOSPITAL_COMMUNITY): Payer: Self-pay | Admitting: Emergency Medicine

## 2013-10-30 ENCOUNTER — Emergency Department (HOSPITAL_COMMUNITY)
Admission: EM | Admit: 2013-10-30 | Discharge: 2013-10-30 | Disposition: A | Discharge status: Home / Self Care | Payer: BC Managed Care – PPO | Source: Home / Self Care | Attending: Emergency Medicine | Admitting: Emergency Medicine

## 2013-10-30 DIAGNOSIS — N3000 Acute cystitis without hematuria: Secondary | ICD-10-CM

## 2013-10-30 LAB — POCT URINALYSIS DIP (DEVICE)
Bilirubin Urine: NEGATIVE
Leukocytes, UA: NEGATIVE
Nitrite: NEGATIVE
Protein, ur: NEGATIVE mg/dL
Urobilinogen, UA: 0.2 mg/dL (ref 0.0–1.0)
pH: 6 (ref 5.0–8.0)

## 2013-10-30 MED ORDER — CEPHALEXIN 500 MG PO CAPS: 500.0000 mg | ORAL_CAPSULE | Freq: Three times a day (TID) | ORAL | Status: DC

## 2013-10-30 MED ORDER — PHENAZOPYRIDINE HCL 200 MG PO TABS: 200.0000 mg | ORAL_TABLET | Freq: Three times a day (TID) | ORAL | Status: DC | PRN

## 2013-10-30 NOTE — ED Notes (Signed)
C/o lower pelvic and back pain. With difficulty urinating. States "I have to push urine out" pt has increased fluids with no relief in symptoms. Denies fever and n/v

## 2013-10-30 NOTE — ED Provider Notes (Signed)
Chief Complaint:   Chief Complaint  Patient presents with  . Urinary Tract Infection    onset 3 days ago    History of Present Illness:   Joanne Molina is a 33 year old female who had a three-day history of decreased urinary flow such that she sometimes has to push to empty her bladder. She notes just a small amount of urine with frequency, bladder pressure, and lower abdomen feels sore and achy. She also has some dysuria, frequency, urgency, and lower back pain. She denies fever, chills, nausea, vomiting, or GYN complaints. She has had a urinary tract infection but was a long time ago.  Review of Systems:  Other than noted above, the patient denies any of the following symptoms: General:  No fevers, chills, sweats, aches, or fatigue. GI:  No abdominal pain, back pain, nausea, vomiting, diarrhea, or constipation. GU:  No dysuria, frequency, urgency, hematuria, or incontinence. GYN:  No discharge, itching, vulvar pain or lesions, pelvic pain, or abnormal vaginal bleeding.  PMFSH:  Past medical history, family history, social history, meds, and allergies were reviewed.    Physical Exam:   Vital signs:  BP 133/84  Pulse 65  Temp(Src) 98.1 F (36.7 C) (Oral)  Resp 12  SpO2 100%  LMP 10/19/2013 Gen:  Alert, oriented, in no distress. Lungs:  Clear to auscultation, no wheezes, rales or rhonchi. Heart:  Regular rhythm, no gallop or murmer. Abdomen:  Flat and soft. There was slight suprapubic pain to palpation.  No guarding, or rebound.  No hepato-splenomegaly or mass.  Bowel sounds were normally active.  No hernia. Back:  No CVA tenderness.  Skin:  Clear, warm and dry.  Labs:    Results for orders placed during the hospital encounter of 10/30/13  POCT URINALYSIS DIP (DEVICE)      Result Value Range   Glucose, UA NEGATIVE  NEGATIVE mg/dL   Bilirubin Urine NEGATIVE  NEGATIVE   Ketones, ur NEGATIVE  NEGATIVE mg/dL   Specific Gravity, Urine 1.010  1.005 - 1.030   Hgb urine dipstick TRACE  (*) NEGATIVE   pH 6.0  5.0 - 8.0   Protein, ur NEGATIVE  NEGATIVE mg/dL   Urobilinogen, UA 0.2  0.0 - 1.0 mg/dL   Nitrite NEGATIVE  NEGATIVE   Leukocytes, UA NEGATIVE  NEGATIVE     A urine culture was obtained.  Results are pending at this time and we will call about any positive results.  Assessment: The encounter diagnosis was Acute cystitis.   Plan:   1.  Meds:  The following meds were prescribed:   Discharge Medication List as of 10/30/2013 12:07 PM    START taking these medications   Details  cephALEXin (KEFLEX) 500 MG capsule Take 1 capsule (500 mg total) by mouth 3 (three) times daily., Starting 10/30/2013, Until Discontinued, Normal    phenazopyridine (PYRIDIUM) 200 MG tablet Take 1 tablet (200 mg total) by mouth 3 (three) times daily as needed for pain., Starting 10/30/2013, Until Discontinued, Normal        2.  Patient Education/Counseling:  The patient was given appropriate handouts, self care instructions, and instructed in symptomatic relief. The patient was told to avoid intercourse for 10 days, get extra fluids, and return for a follow up with her primary care doctor at the completion of treatment for a repeat UA and culture.    3.  Follow up:  The patient was told to follow up if no better in 3 to 4 days, if becoming worse  in any way, and given some red flag symptoms such as fever, worsening pain, or persistent vomiting which would prompt immediate return.  Follow up here or at the emergency department as needed.     Reuben Likes, MD 10/30/13 [phone]

## 2013-10-31 LAB — URINE CULTURE: Culture: NO GROWTH

## 2013-11-05 ENCOUNTER — Other Ambulatory Visit: Payer: Self-pay

## 2013-12-31 HISTORY — PX: UPPER GI ENDOSCOPY: SHX6162

## 2014-02-15 ENCOUNTER — Other Ambulatory Visit: Payer: Self-pay | Admitting: Family Medicine

## 2014-08-19 ENCOUNTER — Emergency Department (HOSPITAL_COMMUNITY)
Admission: EM | Admit: 2014-08-19 | Discharge: 2014-08-19 | Disposition: A | Discharge status: Home / Self Care | Payer: BC Managed Care – PPO | Source: Home / Self Care | Attending: Family Medicine | Admitting: Family Medicine

## 2014-08-19 ENCOUNTER — Encounter (HOSPITAL_COMMUNITY): Payer: Self-pay | Admitting: Emergency Medicine

## 2014-08-19 ENCOUNTER — Emergency Department (INDEPENDENT_AMBULATORY_CARE_PROVIDER_SITE_OTHER): Payer: BC Managed Care – PPO

## 2014-08-19 DIAGNOSIS — M79609 Pain in unspecified limb: Secondary | ICD-10-CM

## 2014-08-19 DIAGNOSIS — M79661 Pain in right lower leg: Secondary | ICD-10-CM

## 2014-08-19 LAB — CBC WITH DIFFERENTIAL/PLATELET
BASOS ABS: 0 10*3/uL (ref 0.0–0.1)
BASOS PCT: 1 % (ref 0–1)
EOS ABS: 0.2 10*3/uL (ref 0.0–0.7)
Eosinophils Relative: 3 % (ref 0–5)
HCT: 39.4 % (ref 36.0–46.0)
Hemoglobin: 13.3 g/dL (ref 12.0–15.0)
Lymphocytes Relative: 25 % (ref 12–46)
Lymphs Abs: 1.4 10*3/uL (ref 0.7–4.0)
MCH: 29.2 pg (ref 26.0–34.0)
MCHC: 33.8 g/dL (ref 30.0–36.0)
MCV: 86.6 fL (ref 78.0–100.0)
Monocytes Absolute: 0.4 10*3/uL (ref 0.1–1.0)
Monocytes Relative: 7 % (ref 3–12)
NEUTROS PCT: 64 % (ref 43–77)
Neutro Abs: 3.5 10*3/uL (ref 1.7–7.7)
PLATELETS: 227 10*3/uL (ref 150–400)
RBC: 4.55 MIL/uL (ref 3.87–5.11)
RDW: 12.6 % (ref 11.5–15.5)
WBC: 5.4 10*3/uL (ref 4.0–10.5)

## 2014-08-19 LAB — D-DIMER, QUANTITATIVE (NOT AT ARMC)

## 2014-08-19 MED ORDER — MELOXICAM 15 MG PO TABS: 15.0000 mg | ORAL_TABLET | Freq: Every day | ORAL | Status: DC
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
## 2014-08-19 NOTE — ED Notes (Signed)
Pt  Reports  Pain in the  Back of  His  r  Calf              X  5  Days          denys  Any  specefic  Injury            denys  Any  Shortness  Of  Breath or  Any  Chest  Pain        No  Recent air  Travel      Or  Taking any BCP    Sitting  Upright on  Exam table  Speaking in  Comp[lete  sentances  And  Is  In no acute  Distress

## 2014-08-19 NOTE — Discharge Instructions (Signed)

## 2014-08-19 NOTE — ED Provider Notes (Signed)
CSN: [id number]     Arrival date & time 08/19/14  1106 History   First MD Initiated Contact with Patient 08/19/14 1120     Chief Complaint  Patient presents with  . Leg Pain   (Consider location/radiation/quality/duration/timing/severity/associated sxs/prior Treatment) HPI Comments: 34 year old female presents for evaluation of right sided leg pain. For about a month, she has had constant pain in the medial portion of the right calf, just behind the tibia. Is tender to touch as well. She denies any injury or increased level of physical activity and does not know what might be causing this. The pain increases with activity and is relieved by rest. No numbness in the foot. No history of DVT or PE. No recent travel. She has a history of a neurologic condition that caused atrophy of the lateral calf muscle in that leg but she has never had any issues with the medial calf, this was about 18 years ago. No swelling of the leg. No other contributory past medical history or family history.  Patient is a 34 y.o. female presenting with leg pain.  Leg Pain   Past Medical History  Diagnosis Date  . Anxiety     panic attack  . Migraine    Past Surgical History  Procedure Laterality Date  . Cholecystectomy  10/02/11   Family History  Problem Relation Age of Onset  . Diabetes Mother   . Hyperlipidemia Mother   . Heart disease Mother   . Diabetes Father   . Hyperlipidemia Father   . Heart disease Father   . Cancer Maternal Grandmother     colon and breast  . Cancer Paternal Grandmother     colon   History  Substance Use Topics  . Smoking status: Never Smoker   . Smokeless tobacco: Never Used  . Alcohol Use: No   OB History   Grav Para Term Preterm Abortions TAB SAB Ect Mult Living                 Review of Systems  Musculoskeletal:       See history of present illness regarding calf pain  All other systems reviewed and are negative.   Allergies  Erythromycin  Home Medications    Prior to Admission medications   Medication Sig Start Date End Date Taking? Authorizing Provider  alum & mag hydroxide-simeth (MAALOX/MYLANTA) 200-200-20 MG/5ML suspension Take 15 mLs by mouth every 6 (six) hours as needed for indigestion.    Historical Provider, MD  Alum Hydroxide-Mag Carbonate (GAVISCON EXTRA STRENGTH) 160-105 MG CHEW Chew 1 tablet by mouth daily as needed (for upset stomach).    Historical Provider, MD  cephALEXin (KEFLEX) 500 MG capsule Take 1 capsule (500 mg total) by mouth 3 (three) times daily. 10/30/13   Reuben Likesavid C Keller, MD  Dapsone (ACZONE) 5 % topical gel Apply 1 application topically 2 (two) times daily.    Historical Provider, MD  esomeprazole (NEXIUM) 40 MG capsule Take 40 mg by mouth every evening.    Historical Provider, MD  FLUoxetine (PROZAC) 20 MG capsule Take 20 mg by mouth daily.    Historical Provider, MD  FLUoxetine (PROZAC) 20 MG capsule TAKE ONE CAPSULE BY MOUTH EVERY DAY    Roderick PeeJeffrey A Todd, MD  fluticasone (FLONASE) 50 MCG/ACT nasal spray Place 2 sprays into the nose daily.    Historical Provider, MD  HYDROcodone-acetaminophen (NORCO/VICODIN) 5-325 MG per tablet Take 1 tablet by mouth every 4 (four) hours as needed for pain. 06/19/13  Arie Sabina Schinlever, PA-C  loratadine (CLARITIN) 10 MG tablet Take 10 mg by mouth daily as needed for allergies.     Historical Provider, MD  meloxicam (MOBIC) 15 MG tablet Take 1 tablet (15 mg total) by mouth daily. 08/19/14   Graylon Good, PA-C  metroNIDAZOLE (FLAGYL) 250 MG tablet Take 250 mg by mouth daily.     Historical Provider, MD  ondansetron (ZOFRAN) 8 MG tablet Take 1 tablet (8 mg total) by mouth every 8 (eight) hours as needed for nausea. 06/19/13   Arie Sabina Schinlever, PA-C  phenazopyridine (PYRIDIUM) 200 MG tablet Take 1 tablet (200 mg total) by mouth 3 (three) times daily as needed for pain. 10/30/13   Reuben Likes, MD  sucralfate (CARAFATE) 1 G tablet Take 1 tablet (1 g total) by mouth 4 (four) times  daily. 06/19/13   Arie Sabina Schinlever, PA-C   BP 129/91  Pulse 84  Temp(Src) 99.7 F (37.6 C) (Oral)  Resp 14  SpO2 99%  LMP 07/31/2014 Physical Exam  Nursing note and vitals reviewed. Constitutional: [name] oriented to person, place, and time. Vital signs are normal. She appears well-developed and well-nourished. No distress.  HENT:  Head: Normocephalic and atraumatic.  Cardiovascular: Normal rate, regular rhythm, normal heart sounds and intact distal pulses.   Pulses:      Radial pulses are 2+ on the right side.  Pulmonary/Chest: Effort normal and breath sounds normal. No respiratory distress.  Musculoskeletal:       Right lower leg: She exhibits tenderness (tenderness of the right calf, medial, just behind the medial portion of the tibia, without objective abnormalities) and bony tenderness (Medial tibia). She exhibits no swelling, no edema and no deformity.  Neurological: [name] alert and oriented to person, place, and time. She has normal strength. Coordination normal.  Skin: Skin is warm and dry. No rash noted. [name] not diaphoretic.  Psychiatric: She has a normal mood and affect. Judgment normal.    ED Course  Procedures (including critical care time) Labs Review Labs Reviewed  CBC WITH DIFFERENTIAL  D-DIMER, QUANTITATIVE    Imaging Review Dg Tibia/fibula Right  08/19/2014   CLINICAL DATA:  Right lower leg pain  EXAM: RIGHT TIBIA AND FIBULA - 2 VIEW  COMPARISON:  None.  FINDINGS: There is no evidence of fracture or other focal bone lesions. Soft tissues are unremarkable.  IMPRESSION: No acute osseous finding   Electronically Signed   By: Ruel Favors M.D.   On: 08/19/2014 12:23     MDM   1. Pain of right lower leg    Labs and x-ray both normal. We'll treat for supposed musculoskeletal pain with daily meloxicam and followup with orthopedics if no improvement.   Meds ordered this encounter  Medications  . meloxicam (MOBIC) 15 MG tablet    Sig: Take 1 tablet  (15 mg total) by mouth daily.    Dispense:  30 tablet    Refill:  0    Order Specific Question:  Supervising Provider    Answer:  Lorenz Coaster, DAVID C [[id number]]       Graylon Good, PA-C 08/19/14 [phone]

## 2014-08-20 NOTE — ED Provider Notes (Signed)
Medical screening examination/treatment/procedure(s) were performed by a resident physician or non-physician practitioner and as the supervising physician I was immediately available for consultation/collaboration.  David Merrell, MD Family Medicine   David J Merrell, MD 08/20/14 2216 

## 2014-09-02 ENCOUNTER — Other Ambulatory Visit: Payer: Self-pay | Admitting: Family Medicine

## 2014-12-15 ENCOUNTER — Other Ambulatory Visit: Payer: Self-pay | Admitting: Family Medicine

## 2015-03-10 ENCOUNTER — Other Ambulatory Visit: Payer: Self-pay | Admitting: Family Medicine

## 2015-04-10 ENCOUNTER — Encounter (HOSPITAL_COMMUNITY): Payer: Self-pay | Admitting: *Deleted

## 2015-04-10 ENCOUNTER — Emergency Department (HOSPITAL_COMMUNITY)
Admission: EM | Admit: 2015-04-10 | Discharge: 2015-04-10 | Disposition: A | Discharge status: Home / Self Care | Payer: BLUE CROSS/BLUE SHIELD | Source: Home / Self Care | Attending: Family Medicine | Admitting: Family Medicine

## 2015-04-10 DIAGNOSIS — L0100 Impetigo, unspecified: Secondary | ICD-10-CM | POA: Diagnosis not present

## 2015-04-10 DIAGNOSIS — Z91048 Other nonmedicinal substance allergy status: Secondary | ICD-10-CM | POA: Diagnosis not present

## 2015-04-10 DIAGNOSIS — Z9109 Other allergy status, other than to drugs and biological substances: Secondary | ICD-10-CM

## 2015-04-10 DIAGNOSIS — K1120 Sialoadenitis, unspecified: Secondary | ICD-10-CM | POA: Diagnosis not present

## 2015-04-10 HISTORY — DX: Depression, unspecified: F32.A

## 2015-04-10 HISTORY — DX: Major depressive disorder, single episode, unspecified: F32.9

## 2015-04-10 MED ORDER — FLUCONAZOLE 150 MG PO TABS: 150.0000 mg | ORAL_TABLET | Freq: Every day | ORAL | Status: DC

## 2015-04-10 MED ORDER — MUPIROCIN 2 % EX OINT: 1.0000 "application " | TOPICAL_OINTMENT | Freq: Two times a day (BID) | CUTANEOUS | Status: DC

## 2015-04-10 MED ORDER — CLINDAMYCIN HCL 300 MG PO CAPS: 300.0000 mg | ORAL_CAPSULE | Freq: Three times a day (TID) | ORAL | Status: DC

## 2015-04-10 MED ORDER — IPRATROPIUM BROMIDE 0.06 % NA SOLN: 2.0000 | Freq: Four times a day (QID) | NASAL | Status: DC

## 2015-04-10 NOTE — ED Provider Notes (Signed)
CSN: [id number]     Arrival date & time 04/10/15  1003 History   First MD Initiated Contact with Patient 04/10/15 1103     Chief Complaint  Patient presents with  . Mass   (Consider location/radiation/quality/duration/timing/severity/associated sxs/prior Treatment) HPI  3 weeks ago devleoped a lump under the neck. Now w/ several lesions on face Getting bigger. Tender. Denies foul taste, purulence, unintentional weight loss, nausea, vomiting, night sweats, chest pain, shortness of breath, dysphagia. Feels sick in general.  Tylenol w/o improvement.   Skin lesions on chin, 3-4. Constant. Getting worse. Clear discharge with crusting.  Past Medical History  Diagnosis Date  . Anxiety     panic attack  . Migraine   . Depression    Past Surgical History  Procedure Laterality Date  . Cholecystectomy  10/02/11   Family History  Problem Relation Age of Onset  . Diabetes Mother   . Hyperlipidemia Mother   . Heart disease Mother   . Diabetes Father   . Hyperlipidemia Father   . Heart disease Father   . Cancer Maternal Grandmother     colon and breast  . Cancer Paternal Grandmother     colon   History  Substance Use Topics  . Smoking status: Never Smoker   . Smokeless tobacco: Never Used  . Alcohol Use: No   OB History    No data available     Review of Systems Per HPI with all other pertinent systems negative.   Allergies  Erythromycin  Home Medications   Prior to Admission medications   Medication Sig Start Date End Date Taking? Authorizing Provider  FLUoxetine (PROZAC) 20 MG capsule Take 20 mg by mouth daily.   Yes Historical Provider, MD  fluticasone (FLONASE) 50 MCG/ACT nasal spray Place 2 sprays into the nose daily.   Yes Historical Provider, MD  clindamycin (CLEOCIN) 300 MG capsule Take 1 capsule (300 mg total) by mouth 3 (three) times daily. 04/10/15   Ozella Rocks, MD  fluconazole (DIFLUCAN) 150 MG tablet Take 1 tablet (150 mg total) by mouth daily. Repeat  dose in 3 days 04/10/15   Ozella Rocks, MD  ipratropium (ATROVENT) 0.06 % nasal spray Place 2 sprays into both nostrils 4 (four) times daily. 04/10/15   Ozella Rocks, MD  mupirocin ointment (BACTROBAN) 2 % Apply 1 application topically 2 (two) times daily. 04/10/15   Ozella Rocks, MD   BP 128/74 mmHg  Pulse 92  Temp(Src) 98.3 F (36.8 C) (Oral)  Resp 16  SpO2 100%  LMP 04/01/2015 Physical Exam Physical Exam  Constitutional: oriented to person, place, and time. appears well-developed and well-nourished. No distress.  HENT:  Head: Normocephalic and atraumatic.  Left submandibular gland enlargement with minimal tenderness to palpation. Single reactive lymph node, pea-sized and tender on the underside of the mandible on the left side. No distinct masses felt. Eyes: EOMI. PERRL.  Neck: Normal range of motion.  Cardiovascular: RRR, no m/r/g, 2+ distal pulses,  Pulmonary/Chest: Effort normal and breath sounds normal. No respiratory distress.  Abdominal: Soft. Bowel sounds are normal. NonTTP, no distension.  Musculoskeletal: Normal range of motion. Non ttp, no effusion.  Neurological: alert and oriented to person, place, and time.  Skin: 4 weeping lesions across the patient's chin with some honey crusting.  Psychiatric: normal mood and affect. behavior is normal. Judgment and thought content normal.   ED Course  Procedures (including critical care time) Labs Review Labs Reviewed - No data to display  Imaging Review No results found.   MDM   1. Impetigo   2. Submandibular gland inflammation   3. Environmental allergies    Start clindamycin. The piercing ointment. Diflucan if developed Felty's infection. Start Flonase. Start nasal Atrovent, nasal saline, and daily allergy pill such as Zyrtec. Patient to chew lots of hard and sour candies and to start ibuprofen 600 mg every 6 hours.  Shelly Flattenavid Merrell, MD Family Medicine 04/10/2015, 11:20 AM      Ozella Rocksavid J Merrell,  MD 04/10/15 1120

## 2015-04-10 NOTE — ED Notes (Signed)
Knot under L chin x 3 weeks.  On Monday neck started to aching and tender.  Over past 24 hrs., her chin started swelling and it eventually burst through the skin yesterday.  States it was draining clear fluid.  It relieved pressure but then it filled back up again, would break the scab and leak again.  It leaked every 1-2 hrs last night.

## 2015-04-10 NOTE — Discharge Instructions (Signed)
The tender spot under her neck is likely a swollen lymph node which is reacting to an infection. The area of swelling near the angle of your jaw is likely an inflamed submandibular gland. Please eat lots of hard and/or sour candies to promote salivation. Please start an anti-inflammatory medicine such as ibuprofen 600 mg every 6 hours. Please start the antibiotics as prescribed for both the facial infection as well as the gland infection. Please use the antibiotic ointment interface. Please use the Diflucan if you develop a yeast infection. Please stop the Flonase for 1 week. You can use Zyrtec nasal Atrovent and nasal saline in its place.

## 2015-06-08 ENCOUNTER — Ambulatory Visit (INDEPENDENT_AMBULATORY_CARE_PROVIDER_SITE_OTHER): Payer: BLUE CROSS/BLUE SHIELD | Admitting: Adult Health

## 2015-06-08 ENCOUNTER — Encounter: Payer: Self-pay | Admitting: Adult Health

## 2015-06-08 VITALS — BP 98/68 | Temp 98.3°F | Ht 66.0 in | Wt 144.2 lb

## 2015-06-08 DIAGNOSIS — F411 Generalized anxiety disorder: Secondary | ICD-10-CM | POA: Diagnosis not present

## 2015-06-08 MED ORDER — FLUOXETINE HCL 40 MG PO CAPS: 40.0000 mg | ORAL_CAPSULE | Freq: Every day | ORAL | Status: DC

## 2015-06-08 NOTE — Patient Instructions (Signed)
Take one pill daily. Let me know if no improvement in the next two or three weeks.

## 2015-06-08 NOTE — Progress Notes (Signed)
   Subjective:    Patient ID: Joanne Molina, female    DOB: 1980-11-17, 35 y.o.   MRN: [id number]  HPI Patient is here for her Prozac prescription. She endorses being on the medication for many years. Recently, she has been having "break through anxiety." She wakes up in the middle of the night with "heart racing" and feeling anxious. This is has been going on and off for 8 months to a year. [name] able to calm herself down and go back to sleep. These episodes have been happening more frequent.   Denies no changes in her life. In fact most of her stressfull life events ( PhD, school) have ended.   She denies any depression or thoughts of suicide   Review of Systems  Constitutional: Negative.   HENT: Negative.   Eyes: Negative.   Respiratory: Negative.   Cardiovascular: Negative.   Gastrointestinal: Negative.   Endocrine: Negative.   Genitourinary: Negative.   Musculoskeletal: Negative.   Allergic/Immunologic: Negative.   Neurological: Negative.   Psychiatric/Behavioral: Positive for sleep disturbance. Negative for suicidal ideas and self-injury. The patient is nervous/anxious.   All other systems reviewed and are negative.      Objective:   Physical Exam  Constitutional: [name] oriented to person, place, and time. She appears well-developed and well-nourished. No distress.  Cardiovascular: Normal rate, regular rhythm, normal heart sounds and intact distal pulses.  Exam reveals no gallop.   No murmur heard. Pulmonary/Chest: Effort normal and breath sounds normal. No respiratory distress. She has no wheezes. She has no rales. She exhibits no tenderness.  Neurological: [name] alert and oriented to person, place, and time.  Skin: Skin is warm and dry. [name] not diaphoretic.  Psychiatric: She has a normal mood and affect. Her behavior is normal. Judgment and thought content normal.  Nursing note and vitals reviewed.      Assessment & Plan:   1. Generalized anxiety disorder -  FLUoxetine (PROZAC) 40 MG capsule; Take 1 capsule (40 mg total) by mouth daily.  Dispense: 90 capsule; Refill: 3 - Follow up in 2-3 weeks if no improvement.

## 2015-06-08 NOTE — Progress Notes (Signed)
Pre visit review using our clinic review tool, if applicable. No additional management support is needed unless otherwise documented below in the visit note. 

## 2015-06-15 ENCOUNTER — Ambulatory Visit: Payer: BLUE CROSS/BLUE SHIELD | Admitting: Adult Health

## 2015-06-27 ENCOUNTER — Encounter (HOSPITAL_COMMUNITY): Payer: Self-pay | Admitting: Emergency Medicine

## 2015-06-27 ENCOUNTER — Emergency Department (HOSPITAL_COMMUNITY)
Admission: EM | Admit: 2015-06-27 | Discharge: 2015-06-27 | Disposition: A | Discharge status: Nursing Home (Includes ICF) | Payer: BLUE CROSS/BLUE SHIELD | Source: Home / Self Care | Attending: Family Medicine | Admitting: Family Medicine

## 2015-06-27 ENCOUNTER — Emergency Department (HOSPITAL_COMMUNITY)
Admission: EM | Admit: 2015-06-27 | Discharge: 2015-06-28 | Disposition: A | Discharge status: Home / Self Care | Payer: BLUE CROSS/BLUE SHIELD | Attending: Emergency Medicine | Admitting: Emergency Medicine

## 2015-06-27 DIAGNOSIS — Z79899 Other long term (current) drug therapy: Secondary | ICD-10-CM | POA: Insufficient documentation

## 2015-06-27 DIAGNOSIS — N8320 Unspecified ovarian cysts: Secondary | ICD-10-CM | POA: Insufficient documentation

## 2015-06-27 DIAGNOSIS — R1031 Right lower quadrant pain: Secondary | ICD-10-CM | POA: Diagnosis not present

## 2015-06-27 DIAGNOSIS — F329 Major depressive disorder, single episode, unspecified: Secondary | ICD-10-CM | POA: Insufficient documentation

## 2015-06-27 DIAGNOSIS — F410 Panic disorder [episodic paroxysmal anxiety] without agoraphobia: Secondary | ICD-10-CM | POA: Insufficient documentation

## 2015-06-27 DIAGNOSIS — R109 Unspecified abdominal pain: Secondary | ICD-10-CM

## 2015-06-27 DIAGNOSIS — N83201 Unspecified ovarian cyst, right side: Secondary | ICD-10-CM

## 2015-06-27 DIAGNOSIS — Z8669 Personal history of other diseases of the nervous system and sense organs: Secondary | ICD-10-CM | POA: Diagnosis not present

## 2015-06-27 LAB — POCT URINALYSIS DIP (DEVICE)
BILIRUBIN URINE: NEGATIVE
Glucose, UA: NEGATIVE mg/dL
Hgb urine dipstick: NEGATIVE
Ketones, ur: NEGATIVE mg/dL
LEUKOCYTES UA: NEGATIVE
NITRITE: NEGATIVE
PH: 6 (ref 5.0–8.0)
Protein, ur: NEGATIVE mg/dL
Specific Gravity, Urine: 1.02 (ref 1.005–1.030)
Urobilinogen, UA: 0.2 mg/dL (ref 0.0–1.0)

## 2015-06-27 LAB — COMPREHENSIVE METABOLIC PANEL
ALK PHOS: 51 U/L (ref 38–126)
ALT: 27 U/L (ref 14–54)
AST: 29 U/L (ref 15–41)
Albumin: 4.4 g/dL (ref 3.5–5.0)
Anion gap: 8 (ref 5–15)
BUN: 9 mg/dL (ref 6–20)
CO2: 26 mmol/L (ref 22–32)
Calcium: 9.1 mg/dL (ref 8.9–10.3)
Chloride: 102 mmol/L (ref 101–111)
Creatinine, Ser: 0.89 mg/dL (ref 0.44–1.00)
GFR calc Af Amer: >60 mL/min (ref >60)
Glucose, Bld: 93 mg/dL (ref 65–99)
POTASSIUM: 4 mmol/L (ref 3.5–5.1)
SODIUM: 136 mmol/L (ref 135–145)
Total Bilirubin: 0.6 mg/dL (ref 0.3–1.2)
Total Protein: 7.3 g/dL (ref 6.5–8.1)

## 2015-06-27 LAB — CBC WITH DIFFERENTIAL/PLATELET
Basophils Absolute: 0 10*3/uL (ref 0.0–0.1)
Basophils Relative: 0 % (ref 0–1)
EOS ABS: 0.1 10*3/uL (ref 0.0–0.7)
Eosinophils Relative: 1 % (ref 0–5)
HCT: 36.5 % (ref 36.0–46.0)
HEMOGLOBIN: 11.9 g/dL — AB (ref 12.0–15.0)
LYMPHS ABS: 1.4 10*3/uL (ref 0.7–4.0)
Lymphocytes Relative: 22 % (ref 12–46)
MCH: 26.2 pg (ref 26.0–34.0)
MCHC: 32.6 g/dL (ref 30.0–36.0)
MCV: 80.4 fL (ref 78.0–100.0)
Monocytes Absolute: 0.3 10*3/uL (ref 0.1–1.0)
Monocytes Relative: 5 % (ref 3–12)
NEUTROS ABS: 4.5 10*3/uL (ref 1.7–7.7)
NEUTROS PCT: 72 % (ref 43–77)
PLATELETS: 260 10*3/uL (ref 150–400)
RBC: 4.54 MIL/uL (ref 3.87–5.11)
RDW: 13.8 % (ref 11.5–15.5)
WBC: 6.3 10*3/uL (ref 4.0–10.5)

## 2015-06-27 LAB — POCT PREGNANCY, URINE: Preg Test, Ur: NEGATIVE

## 2015-06-27 MED ORDER — SODIUM CHLORIDE 0.9 % IV BOLUS (SEPSIS)
1000.0000 mL | Freq: Once | INTRAVENOUS | Status: AC
  Administered 2015-06-27: 1000 mL via INTRAVENOUS

## 2015-06-27 NOTE — ED Provider Notes (Signed)
Joanne Molina is a 35 y.o. female who presents to Urgent Care today for abdominal pain. Patient notes severe right lower quadrant abdominal pain worsening over the past 3 days. The pain is worse with palpation. She notes that although she denies any urinary frequency urgency or dysuria she feels that she has to lean forward to urinate. She denies any vaginal discharge. She has a history of a cholecystectomy. She has not tried any medications yet.   Past Medical History  Diagnosis Date  . Anxiety     panic attack  . Migraine   . Depression    Past Surgical History  Procedure Laterality Date  . Cholecystectomy  10/02/11   History  Substance Use Topics  . Smoking status: Never Smoker   . Smokeless tobacco: Never Used  . Alcohol Use: No   ROS as above Medications: No current facility-administered medications for this encounter.   Current Outpatient Prescriptions  Medication Sig Dispense Refill  . FLUoxetine (PROZAC) 40 MG capsule Take 1 capsule (40 mg total) by mouth daily. 90 capsule 3  . fluticasone (FLONASE) 50 MCG/ACT nasal spray Place 2 sprays into the nose daily.    Marland Kitchen loratadine (CLARITIN) 10 MG tablet Take 10 mg by mouth daily.     Allergies  Allergen Reactions  . Erythromycin     REACTION: Upset GI     Exam:  BP 128/83 mmHg  Pulse 82  Temp(Src) 99.7 F (37.6 C) (Oral)  Resp 12  SpO2 100%  LMP 06/15/2015 Gen: Well NAD HEENT: EOMI,  MMM Lungs: Normal work of breathing. CTABL Heart: RRR no MRG Abd: NABS, Soft. Nondistended, tender palpation with right lower quadrant with some rebound tenderness and referred pain. Exts: Brisk capillary refill, warm and well perfused.   Results for orders placed or performed during the hospital encounter of 06/27/15 (from the past 24 hour(s))  POCT urinalysis dip (device)     Status: None   Collection Time: 06/27/15  4:04 PM  Result Value Ref Range   Glucose, UA NEGATIVE NEGATIVE mg/dL   Bilirubin Urine NEGATIVE NEGATIVE   Ketones, ur NEGATIVE NEGATIVE mg/dL   Specific Gravity, Urine 1.020 1.005 - 1.030   Hgb urine dipstick NEGATIVE NEGATIVE   pH 6.0 5.0 - 8.0   Protein, ur NEGATIVE NEGATIVE mg/dL   Urobilinogen, UA 0.2 0.0 - 1.0 mg/dL   Nitrite NEGATIVE NEGATIVE   Leukocytes, UA NEGATIVE NEGATIVE  Pregnancy, urine POC     Status: None   Collection Time: 06/27/15  4:07 PM  Result Value Ref Range   Preg Test, Ur NEGATIVE NEGATIVE   No results found.  Assessment and Plan: 35 y.o. female with right lower quadrant abdominal pain. Concerning for appendicitis versus ovarian cyst. Pain is worsening and patient is quite uncomfortable. Transfer to ED for further evaluation and management.  Discussed warning signs or symptoms. Please see discharge instructions. Patient expresses understanding.     Rodolph Bong, MD 06/27/15 [phone]

## 2015-06-27 NOTE — ED Notes (Signed)
C/o constant RLQ pain onset 6/24 Hx of cholecystectomy Also reports feeling nauseas Denies fevers, chills Alert, on signs of acute distress.

## 2015-06-27 NOTE — ED Notes (Signed)
Pt sent from ucc to r/o appendicitis or cyst. Pt c.o mid to R lower abdominal pain x 3 days. 99.7 temp from UC. sts she has to position herself forward to urinate.

## 2015-06-27 NOTE — ED Provider Notes (Signed)
CSN: [id number]     Arrival date & time 06/27/15  1649 History   None    Chief Complaint  Patient presents with  . Abdominal Pain     (Consider location/radiation/quality/duration/timing/severity/associated sxs/prior Treatment) HPI Comments: Patient is a 35 yo F PMHx significant for anxiety, migraines, depression presenting from [facility] for evaluation of three days of worsening right lower quadrant abdominal pain. She endorses some associated nausea without vomiting. No modifying factors identified. She states her pain is worsened with palpation.She notes that although she denies any urinary frequency urgency or dysuria she feels that she has to lean forward to urinate. She denies any vaginal discharge. She has a history of a cholecystectomy. She has not tried any medications yet.  Patient is a 35 y.o. female presenting with abdominal pain.  Abdominal Pain Pain location:  RLQ Pain radiates to:  Does not radiate Onset quality:  Sudden Duration:  3 days Chronicity:  New Context: previous surgery   Relieved by:  None tried Worsened by:  Nothing tried Ineffective treatments:  None tried Associated symptoms: no anorexia, no constipation, no diarrhea, no dysuria, no fever, no hematemesis, no hematuria, no vaginal bleeding, no vaginal discharge and no vomiting     Past Medical History  Diagnosis Date  . Anxiety     panic attack  . Migraine   . Depression    Past Surgical History  Procedure Laterality Date  . Cholecystectomy  10/02/11   Family History  Problem Relation Age of Onset  . Diabetes Mother   . Hyperlipidemia Mother   . Heart disease Mother   . Diabetes Father   . Hyperlipidemia Father   . Heart disease Father   . Cancer Maternal Grandmother     colon and breast  . Cancer Paternal Grandmother     colon   History  Substance Use Topics  . Smoking status: Never Smoker   . Smokeless tobacco: Never Used  . Alcohol Use: No   OB History    No data available      Review of Systems  Constitutional: Negative for fever.  Gastrointestinal: Positive for abdominal pain. Negative for vomiting, diarrhea, constipation, anorexia and hematemesis.  Genitourinary: Negative for dysuria, hematuria, vaginal bleeding and vaginal discharge.  All other systems reviewed and are negative.     Allergies  Erythromycin  Home Medications   Prior to Admission medications   Medication Sig Start Date End Date Taking? Authorizing Provider  FLUoxetine (PROZAC) 40 MG capsule Take 1 capsule (40 mg total) by mouth daily. 06/08/15  Yes Shirline Frees, NP  fluticasone (FLONASE) 50 MCG/ACT nasal spray Place 2 sprays into the nose daily.   Yes Historical Provider, MD  loratadine (CLARITIN) 10 MG tablet Take 10 mg by mouth daily.   Yes Historical Provider, MD  HYDROcodone-acetaminophen (NORCO/VICODIN) 5-325 MG per tablet Take 1-2 tablets by mouth every 6 (six) hours as needed. 06/28/15   Yoni Lobos, PA-C  ondansetron (ZOFRAN ODT) 4 MG disintegrating tablet Take 1 tablet (4 mg total) by mouth every 8 (eight) hours as needed for nausea or vomiting. 06/28/15   Victorino Dike Saria Haran, PA-C   BP 119/91 mmHg  Pulse 70  Temp(Src) 97.6 F (36.4 C) (Oral)  Resp 12  Ht  (1.651 m)  Wt 141 lb 15.6 oz (64.399 kg)  BMI 23.63 kg/m2  SpO2 100%  LMP 06/15/2015 Physical Exam  Constitutional: [name] oriented to person, place, and time. She appears well-developed and well-nourished. No distress.  HENT:  Head:  Normocephalic and atraumatic.  Right Ear: External ear normal.  Left Ear: External ear normal.  Nose: Nose normal.  Mouth/Throat: Oropharynx is clear and moist. No oropharyngeal exudate.  Eyes: Conjunctivae are normal.  Neck: Neck supple.  Cardiovascular: Normal rate, regular rhythm and normal heart sounds.   Pulmonary/Chest: Effort normal and breath sounds normal.  Abdominal: Soft. Bowel sounds are normal. She exhibits no distension. There is tenderness in the right  lower quadrant. There is no rigidity, no rebound and no guarding.  Musculoskeletal: Normal range of motion. She exhibits no edema.  Neurological: [name] alert and oriented to person, place, and time.  Skin: Skin is warm and dry. [name] not diaphoretic.  Nursing note and vitals reviewed.   ED Course  Procedures (including critical care time)  Medications  sodium chloride 0.9 % bolus 1,000 mL (0 mLs Intravenous Stopped 06/27/15 2341)  iohexol (OMNIPAQUE) 300 MG/ML solution 100 mL (100 mLs Intravenous Contrast Given 06/28/15 0005)  iohexol (OMNIPAQUE) 300 MG/ML solution 25 mL (25 mLs Oral Contrast Given 06/28/15 0005)  acetaminophen (TYLENOL) tablet 650 mg (650 mg Oral Given 06/28/15 0227)    Labs Review Labs Reviewed  CBC WITH DIFFERENTIAL/PLATELET - Abnormal; Notable for the following:    Hemoglobin 11.9 (*)    All other components within normal limits  COMPREHENSIVE METABOLIC PANEL    Imaging Review Ct Abdomen Pelvis W Contrast  06/28/2015   CLINICAL DATA:  Right lower quadrant pain for 3 days with nausea.  EXAM: CT ABDOMEN AND PELVIS WITH CONTRAST  TECHNIQUE: Multidetector CT imaging of the abdomen and pelvis was performed using the standard protocol following bolus administration of intravenous contrast.  CONTRAST:  OMNIPAQUE IOHEXOL 300 MG/ML SOLN, 25mL OMNIPAQUE IOHEXOL 300 MG/ML SOLN  COMPARISON:  07/25/2008  FINDINGS: The appendix is normal. Remainder of the bowel is unremarkable. There is a partially collapsed peripherally enhancing right adnexal cyst measuring 2 cm. There is a small volume surrounding free pelvic fluid.  There is prior cholecystectomy. There are unchanged benign hepatic hypo densities. There are no suspicious focal liver lesions. There is no bile duct dilatation.  The spleen, pancreas, adrenals and kidneys are remarkable only for an unchanged benign 7 mm cyst at the medial aspect of the right renal midpole.  The abdominal aorta is normal in caliber. There is no  atherosclerotic calcification. There is no adenopathy in the abdomen or pelvis.  There is a small fat containing umbilical hernia. There is no significant musculoskeletal abnormality. There is no significant abnormality in the lower chest.  IMPRESSION: 1. Partially collapsed peripherally enhancing right ovarian cyst with surrounding free pelvic fluid 2. Normal appendix 3. Small fat containing umbilical hernia.   Electronically Signed   By: Ellery Plunk M.D.   On: 06/28/2015 00:58     EKG Interpretation None      MDM   Final diagnoses:  Abdominal pain  Right ovarian cyst    Filed Vitals:   06/28/15 0218  BP: 119/91  Pulse: 70  Temp: 97.6 F (36.4 C)  Resp: 12   Afebrile, NAD, non-toxic appearing, AAOx4.   I have reviewed nursing notes, vital signs, and all appropriate lab and imaging results if ordered as above.   Patient is nontoxic, nonseptic appearing, in no apparent distress.  Patient's pain and other symptoms adequately managed in emergency department.  Fluid bolus given.  Labs, imaging and vitals reviewed.  Patient does not meet the SIRS or Sepsis criteria.  On repeat exam patient does not  have a surgical abdomen and there are no peritoneal signs.  No indication of appendicitis, bowel obstruction, bowel perforation, cholecystitis, diverticulitis, PID or ectopic pregnancy. Evidence of ruptured ovarian cyst, likely cause of pain. Patient discharged home with symptomatic treatment and given strict instructions for follow-up with their primary care physician.  I have also discussed reasons to return immediately to the ER.  Patient expresses understanding and agrees with plan. Patient is stable at time of discharge        Francee PiccoloJennifer Shelva Hetzer, PA-C 06/29/15 0327  Pricilla LovelessScott Goldston, MD 06/30/15 [phone]

## 2015-06-28 ENCOUNTER — Other Ambulatory Visit (HOSPITAL_COMMUNITY): Payer: BLUE CROSS/BLUE SHIELD

## 2015-06-28 ENCOUNTER — Encounter (HOSPITAL_COMMUNITY): Payer: Self-pay

## 2015-06-28 ENCOUNTER — Emergency Department (HOSPITAL_COMMUNITY): Payer: BLUE CROSS/BLUE SHIELD

## 2015-06-28 MED ORDER — ACETAMINOPHEN 325 MG PO TABS
650.0000 mg | ORAL_TABLET | Freq: Once | ORAL | Status: AC
  Administered 2015-06-28: 650 mg via ORAL
  Filled 2015-06-28: qty 2

## 2015-06-28 MED ORDER — ONDANSETRON 4 MG PO TBDP: 4.0000 mg | ORAL_TABLET | Freq: Three times a day (TID) | ORAL | Status: DC | PRN

## 2015-06-28 MED ORDER — HYDROCODONE-ACETAMINOPHEN 5-325 MG PO TABS: 1.0000 | ORAL_TABLET | Freq: Four times a day (QID) | ORAL | Status: DC | PRN

## 2015-06-28 MED ORDER — IOHEXOL 300 MG/ML  SOLN
25.0000 mL | Freq: Once | INTRAMUSCULAR | Status: AC | PRN
  Administered 2015-06-28: 25 mL via ORAL

## 2015-06-28 MED ORDER — IOHEXOL 300 MG/ML  SOLN
100.0000 mL | Freq: Once | INTRAMUSCULAR | Status: AC | PRN
  Administered 2015-06-28: 100 mL via INTRAVENOUS

## 2015-06-28 NOTE — Discharge Instructions (Signed)
Please follow up with your primary care physician in 1-2 days. If you do not have one please call [facility] number listed above. Please take pain medication and/or muscle relaxants as prescribed and as needed for pain. Please do not drive on narcotic pain medication or on muscle relaxants. Please read all discharge instructions and return precautions.   Ovarian Cyst An ovarian cyst is a fluid-filled sac that forms on an ovary. The ovaries are small organs that produce eggs in women. Various types of cysts can form on the ovaries. Most are not cancerous. Many do not cause problems, and they often go away on their own. Some may cause symptoms and require treatment. Common types of ovarian cysts include:  Functional cysts--These cysts may occur every month during the menstrual cycle. This is normal. The cysts usually go away with the next menstrual cycle if the woman does not get pregnant. Usually, there are no symptoms with a functional cyst.  Endometrioma cysts--These cysts form from the tissue that lines the uterus. They are also called "chocolate cysts" because they become filled with blood that turns brown. This type of cyst can cause pain in the lower abdomen during intercourse and with your menstrual period.  Cystadenoma cysts--This type develops from the cells on the outside of the ovary. These cysts can get very big and cause lower abdomen pain and pain with intercourse. This type of cyst can twist on itself, cut off its blood supply, and cause severe pain. It can also easily rupture and cause a lot of pain.  Dermoid cysts--This type of cyst is sometimes found in both ovaries. These cysts may contain different kinds of body tissue, such as skin, teeth, hair, or cartilage. They usually do not cause symptoms unless they get very big.  Theca lutein cysts--These cysts occur when too much of a certain hormone (human chorionic gonadotropin) is produced and overstimulates the  ovaries to produce an egg. This is most common after procedures used to assist with the conception of a baby (in vitro fertilization). CAUSES   Fertility drugs can cause a condition in which multiple large cysts are formed on the ovaries. This is called ovarian hyperstimulation syndrome.  A condition called polycystic ovary syndrome can cause hormonal imbalances that can lead to nonfunctional ovarian cysts. SIGNS AND SYMPTOMS  Many ovarian cysts do not cause symptoms. If symptoms are present, they may include:  Pelvic pain or pressure.  Pain in the lower abdomen.  Pain during sexual intercourse.  Increasing girth (swelling) of the abdomen.  Abnormal menstrual periods.  Increasing pain with menstrual periods.  Stopping having menstrual periods without being pregnant. DIAGNOSIS  These cysts are commonly found during a routine or annual pelvic exam. Tests may be ordered to find out more about the cyst. These tests may include:  Ultrasound.  X-ray of the pelvis.  CT scan.  MRI.  Blood tests. TREATMENT  Many ovarian cysts go away on their own without treatment. Your health care provider may want to check your cyst regularly for 2-3 months to see if it changes. For women in menopause, it is particularly important to monitor a cyst closely because of the higher rate of ovarian cancer in menopausal women. When treatment is needed, it may include any of the following:  A procedure to drain the cyst (aspiration). This may be done using a long needle and ultrasound. It can also be done through a laparoscopic procedure. This involves using a thin, lighted tube  with a tiny camera on the end (laparoscope) inserted through a small incision.  Surgery to remove the whole cyst. This may be done using laparoscopic surgery or an open surgery involving a larger incision in the lower abdomen.  Hormone treatment or birth control pills. These methods are sometimes used to help dissolve a cyst. HOME  CARE INSTRUCTIONS   Only take over-the-counter or prescription medicines as directed by your health care provider.  Follow up with your health care provider as directed.  Get regular pelvic exams and Pap tests. SEEK MEDICAL CARE IF:   Your periods are late, irregular, or painful, or they stop.  Your pelvic pain or abdominal pain does not go away.  Your abdomen becomes larger or swollen.  You have pressure on your bladder or trouble emptying your bladder completely.  You have pain during sexual intercourse.  You have feelings of fullness, pressure, or discomfort in your stomach.  You lose weight for no apparent reason.  You feel generally ill.  You become constipated.  You lose your appetite.  You develop acne.  You have an increase in body and facial hair.  You are gaining weight, without changing your exercise and eating habits.  You think you are pregnant. SEEK IMMEDIATE MEDICAL CARE IF:   You have increasing abdominal pain.  You feel sick to your stomach (nauseous), and you throw up (vomit).  You develop a fever that comes on suddenly.  You have abdominal pain during a bowel movement.  Your menstrual periods become heavier than usual. MAKE SURE YOU:  Understand these instructions.  Will watch your condition.  Will get help right away if you are not doing well or get worse. Document Released: 12/17/2005 Document Revised: 12/22/2013 Document Reviewed: 08/24/2013 [facility] Patient Information 2015 [place], [place]. This information is not intended to replace advice given to you by your health care provider. Make sure you discuss any questions you have with your health care provider.

## 2015-07-13 ENCOUNTER — Ambulatory Visit: Payer: BLUE CROSS/BLUE SHIELD | Admitting: Adult Health

## 2015-10-17 ENCOUNTER — Ambulatory Visit (INDEPENDENT_AMBULATORY_CARE_PROVIDER_SITE_OTHER): Payer: BLUE CROSS/BLUE SHIELD | Admitting: Physician Assistant

## 2015-10-17 ENCOUNTER — Ambulatory Visit (INDEPENDENT_AMBULATORY_CARE_PROVIDER_SITE_OTHER): Payer: BLUE CROSS/BLUE SHIELD

## 2015-10-17 VITALS — BP 118/80 | HR 94 | Temp 98.4°F | Resp 18 | Ht 66.0 in | Wt 143.4 lb

## 2015-10-17 DIAGNOSIS — R05 Cough: Secondary | ICD-10-CM

## 2015-10-17 DIAGNOSIS — R058 Other specified cough: Secondary | ICD-10-CM

## 2015-10-17 DIAGNOSIS — J209 Acute bronchitis, unspecified: Secondary | ICD-10-CM

## 2015-10-17 LAB — POCT CBC
GRANULOCYTE PERCENT: 59.7 % (ref 37–80)
HEMATOCRIT: 36.3 % — AB (ref 37.7–47.9)
HEMOGLOBIN: 12.1 g/dL — AB (ref 12.2–16.2)
Lymph, poc: 1.8 (ref 0.6–3.4)
MCH: 25.7 pg — AB (ref 27–31.2)
MCHC: 33.4 g/dL (ref 31.8–35.4)
MCV: 76.9 fL — AB (ref 80–97)
MID (cbc): 0.5 (ref 0–0.9)
MPV: 7.1 fL (ref 0–99.8)
POC GRANULOCYTE: 3.5 (ref 2–6.9)
POC LYMPH PERCENT: 31.1 %L (ref 10–50)
POC MID %: 9.2 %M (ref 0–12)
Platelet Count, POC: 218 10*3/uL (ref 142–424)
RBC: 4.71 M/uL (ref 4.04–5.48)
RDW, POC: 16.1 %
WBC: 5.9 10*3/uL (ref 4.6–10.2)

## 2015-10-17 MED ORDER — BENZONATATE 100 MG PO CAPS: 100.0000 mg | ORAL_CAPSULE | Freq: Three times a day (TID) | ORAL | Status: DC | PRN

## 2015-10-17 MED ORDER — HYDROCOD POLST-CPM POLST ER 10-8 MG/5ML PO SUER: 5.0000 mL | Freq: Every evening | ORAL | Status: DC | PRN

## 2015-10-17 MED ORDER — ALBUTEROL SULFATE HFA 108 (90 BASE) MCG/ACT IN AERS: 2.0000 | INHALATION_SPRAY | RESPIRATORY_TRACT | Status: DC | PRN

## 2015-10-17 NOTE — Progress Notes (Signed)
Subjective:    Patient ID: Joanne Molina, female    DOB: Feb 29, 1980, 35 y.o.   MRN: [id number]  HPI Patient presents for productive cough that has been present for the past week, but has gotten worse in that now she is having rattling and difficulty breathing for the past 3 days. Additionally endorses low grade temp of 100 degrees and congestion, post-nasal drip, and rhinorrhea. States overall she just feels terrible. Denies HA/dizziness, otalgia, sore throat, CP, palpitations, or N/V. Has h/o bronchitis yearly as a child, however, this has improved as an adult. Denies h/o asthma or allergies. Daughter recently sick as well, but has resolved. Has been taking Azithromycin for the past 20 days for acne given to her by her dermatologist. Med allergy erythromycin.  Review of Systems As note above.    Objective:   Physical Exam  Constitutional: [name] oriented to person, place, and time. She appears well-developed and well-nourished. No distress.  Blood pressure 118/80, pulse 94, temperature 98.4 F (36.9 C), temperature source Oral, resp. rate 18, height  (1.676 m), weight 143 lb 6.4 oz (65.046 kg), last menstrual period 09/30/2015, SpO2 97 %.  HENT:  Head: Normocephalic and atraumatic.  Right Ear: Tympanic membrane, external ear and ear canal normal.  Left Ear: Tympanic membrane, external ear and ear canal normal.  Nose: Rhinorrhea (with erythema) present. Right sinus exhibits no maxillary sinus tenderness and no frontal sinus tenderness. Left sinus exhibits no maxillary sinus tenderness and no frontal sinus tenderness.  Mouth/Throat: Uvula is midline and mucous membranes are normal. Posterior oropharyngeal erythema (moderate) present. No oropharyngeal exudate or posterior oropharyngeal edema.  Eyes: Conjunctivae are normal. Pupils are equal, round, and reactive to light. Right eye exhibits no discharge. Left eye exhibits no discharge. No scleral icterus.  Neck: Normal range of motion. Neck  supple. No thyromegaly present.  Cardiovascular: Normal rate, regular rhythm and normal heart sounds.  Exam reveals no gallop and no friction rub.   No murmur heard. Pulmonary/Chest: Effort normal. No respiratory distress. She has no decreased breath sounds. She has no wheezes. She has no rhonchi. She has rales in the right upper field, the right middle field, the right lower field, the left upper field and the left middle field.  Abdominal: Soft. Bowel sounds are normal. She exhibits no distension. There is no tenderness. There is no rebound and no guarding.  Lymphadenopathy:    She has no cervical adenopathy.  Neurological: [name] alert and oriented to person, place, and time.  Skin: Skin is warm and dry. No rash noted. [name] not diaphoretic. No erythema.   Results for orders placed or performed in visit on 10/17/15  POCT CBC  Result Value Ref Range   WBC 5.9 4.6 - 10.2 K/uL   Lymph, poc 1.8 0.6 - 3.4   POC LYMPH PERCENT 31.1 10 - 50 %L   MID (cbc) 0.5 0 - 0.9   POC MID % 9.2 0 - 12 %M   POC Granulocyte 3.5 2 - 6.9   Granulocyte percent 59.7 37 - 80 %G   RBC 4.71 4.04 - 5.48 M/uL   Hemoglobin 12.1 (A) 12.2 - 16.2 g/dL   HCT, POC 40.9 (A) 81.1 - 47.9 %   MCV 76.9 (A) 80 - 97 fL   MCH, POC 25.7 (A) 27 - 31.2 pg   MCHC 33.4 31.8 - 35.4 g/dL   RDW, POC 91.4 %   Platelet Count, POC 218 142 - 424 K/uL  MPV 7.1 0 - 99.8 fL   [facility] reading (PRIMARY) by  Dr. Merla Richesoolittle. Question of increased markings.      Assessment & Plan:  1. Productive cough - DG Chest 2 View; Future - POCT CBC  2. Acute bronchitis, unspecified organism Albuterol scheduled for next 72 hours. Explained proper inhaler use.  - albuterol (PROVENTIL HFA;VENTOLIN HFA) 108 (90 BASE) MCG/ACT inhaler; Inhale 2 puffs into the lungs every 4 (four) hours as needed for wheezing or shortness of breath (cough, shortness of breath or wheezing.).  Dispense: 1 Inhaler; Refill: 1 - benzonatate (TESSALON) 100 MG capsule; Take 1-2  capsules (100-200 mg total) by mouth 3 (three) times daily as needed for cough.  Dispense: 40 capsule; Refill: 0 - chlorpheniramine-HYDROcodone (TUSSIONEX PENNKINETIC ER) 10-8 MG/5ML SUER; Take 5 mLs by mouth at bedtime as needed for cough.  Dispense: 75 mL; Refill: 0   Jacelyn Cuen PA-C  [facility] 10/17/2015 9:22 PM

## 2015-10-17 NOTE — Patient Instructions (Signed)

## 2015-11-29 ENCOUNTER — Other Ambulatory Visit: Payer: Self-pay | Admitting: Family Medicine

## 2015-11-29 DIAGNOSIS — H9202 Otalgia, left ear: Secondary | ICD-10-CM

## 2015-12-02 ENCOUNTER — Other Ambulatory Visit: Payer: BLUE CROSS/BLUE SHIELD

## 2015-12-06 ENCOUNTER — Ambulatory Visit
Admission: RE | Admit: 2015-12-06 | Discharge: 2015-12-06 | Disposition: A | Discharge status: Home / Self Care | Payer: BLUE CROSS/BLUE SHIELD | Source: Ambulatory Visit | Attending: Family Medicine | Admitting: Family Medicine

## 2015-12-06 DIAGNOSIS — H9202 Otalgia, left ear: Secondary | ICD-10-CM

## 2016-01-01 NOTE — L&D Delivery Note (Signed)
Delivery Note Pt reached complete dilation and pushed great for about 10 minutes.  At 3:14 PM a viable female was delivered via Vaginal, Spontaneous Delivery (Presentation: Left Occiput Anterior). Fluid at delivery was noted to be very bloody, had not been prior.  Possible partial abruption.   APGAR: 4, 9; weight pending . Baby had initial good cry, then got floppy on mother's chest.  Responded to stimulation and suctioning and O2 sat stable--returned to mother's chest and pink.  Placenta status: Intact, Spontaneous.  Cord: 3 vessels with the following complications: Nuchal x 1.  Anesthesia: Epidural  Episiotomy: None Lacerations: None Suture Repair: n/a Est. Blood Loss (mL): 100ml  Mom to postpartum.  Baby to Couplet care / Skin to Skin. D/w pt circumcision and they desire to proceed.  Oliver PilaRICHARDSON,Anuoluwapo Mefferd W 07/12/2016, 3:34 PM

## 2016-01-05 LAB — OB RESULTS CONSOLE RUBELLA ANTIBODY, IGM: RUBELLA: IMMUNE

## 2016-01-05 LAB — OB RESULTS CONSOLE RPR: RPR: NONREACTIVE

## 2016-01-05 LAB — OB RESULTS CONSOLE HEPATITIS B SURFACE ANTIGEN: HEP B S AG: NEGATIVE

## 2016-01-05 LAB — OB RESULTS CONSOLE GC/CHLAMYDIA
CHLAMYDIA, DNA PROBE: NEGATIVE
Gonorrhea: NEGATIVE

## 2016-01-05 LAB — OB RESULTS CONSOLE HIV ANTIBODY (ROUTINE TESTING): HIV: NONREACTIVE

## 2016-04-20 DIAGNOSIS — Z3A25 25 weeks gestation of pregnancy: Secondary | ICD-10-CM | POA: Diagnosis not present

## 2016-04-20 DIAGNOSIS — O09522 Supervision of elderly multigravida, second trimester: Secondary | ICD-10-CM | POA: Diagnosis not present

## 2016-05-04 DIAGNOSIS — Z36 Encounter for antenatal screening of mother: Secondary | ICD-10-CM | POA: Diagnosis not present

## 2016-05-04 DIAGNOSIS — Z6731 Type AB blood, Rh negative: Secondary | ICD-10-CM | POA: Diagnosis not present

## 2016-05-04 DIAGNOSIS — Z23 Encounter for immunization: Secondary | ICD-10-CM | POA: Diagnosis not present

## 2016-05-04 DIAGNOSIS — O360120 Maternal care for anti-D [Rh] antibodies, second trimester, not applicable or unspecified: Secondary | ICD-10-CM | POA: Diagnosis not present

## 2016-05-04 DIAGNOSIS — O09522 Supervision of elderly multigravida, second trimester: Secondary | ICD-10-CM | POA: Diagnosis not present

## 2016-05-04 DIAGNOSIS — L299 Pruritus, unspecified: Secondary | ICD-10-CM | POA: Diagnosis not present

## 2016-05-04 DIAGNOSIS — Z3A27 27 weeks gestation of pregnancy: Secondary | ICD-10-CM | POA: Diagnosis not present

## 2016-07-04 DIAGNOSIS — Z36 Encounter for antenatal screening of mother: Secondary | ICD-10-CM | POA: Diagnosis not present

## 2016-07-04 DIAGNOSIS — N898 Other specified noninflammatory disorders of vagina: Secondary | ICD-10-CM | POA: Diagnosis not present

## 2016-07-04 DIAGNOSIS — O139 Gestational [pregnancy-induced] hypertension without significant proteinuria, unspecified trimester: Secondary | ICD-10-CM | POA: Diagnosis not present

## 2016-07-04 LAB — OB RESULTS CONSOLE GBS: GBS: NEGATIVE

## 2016-07-05 DIAGNOSIS — O139 Gestational [pregnancy-induced] hypertension without significant proteinuria, unspecified trimester: Secondary | ICD-10-CM | POA: Diagnosis not present

## 2016-07-11 ENCOUNTER — Encounter (HOSPITAL_COMMUNITY): Payer: Self-pay

## 2016-07-11 ENCOUNTER — Inpatient Hospital Stay (HOSPITAL_COMMUNITY)
Admission: AD | Admit: 2016-07-11 | Discharge: 2016-07-14 | DRG: 774 | Disposition: A | Discharge status: Home / Self Care | Payer: BLUE CROSS/BLUE SHIELD | Source: Ambulatory Visit | Attending: Obstetrics and Gynecology | Admitting: Obstetrics and Gynecology

## 2016-07-11 DIAGNOSIS — Z833 Family history of diabetes mellitus: Secondary | ICD-10-CM

## 2016-07-11 DIAGNOSIS — Z3A37 37 weeks gestation of pregnancy: Secondary | ICD-10-CM | POA: Diagnosis not present

## 2016-07-11 DIAGNOSIS — K219 Gastro-esophageal reflux disease without esophagitis: Secondary | ICD-10-CM | POA: Diagnosis present

## 2016-07-11 DIAGNOSIS — Z8249 Family history of ischemic heart disease and other diseases of the circulatory system: Secondary | ICD-10-CM

## 2016-07-11 DIAGNOSIS — O134 Gestational [pregnancy-induced] hypertension without significant proteinuria, complicating childbirth: Secondary | ICD-10-CM | POA: Diagnosis not present

## 2016-07-11 DIAGNOSIS — O9962 Diseases of the digestive system complicating childbirth: Secondary | ICD-10-CM | POA: Diagnosis present

## 2016-07-11 DIAGNOSIS — O133 Gestational [pregnancy-induced] hypertension without significant proteinuria, third trimester: Secondary | ICD-10-CM | POA: Diagnosis present

## 2016-07-11 DIAGNOSIS — O4593 Premature separation of placenta, unspecified, third trimester: Secondary | ICD-10-CM | POA: Diagnosis present

## 2016-07-11 DIAGNOSIS — Z2882 Immunization not carried out because of caregiver refusal: Secondary | ICD-10-CM | POA: Diagnosis not present

## 2016-07-11 DIAGNOSIS — O139 Gestational [pregnancy-induced] hypertension without significant proteinuria, unspecified trimester: Secondary | ICD-10-CM | POA: Diagnosis present

## 2016-07-11 LAB — LACTATE DEHYDROGENASE: LDH: 170 U/L (ref 98–192)

## 2016-07-11 LAB — CBC
HCT: 34.9 % — ABNORMAL LOW (ref 36.0–46.0)
Hemoglobin: 12 g/dL (ref 12.0–15.0)
MCH: 31.5 pg (ref 26.0–34.0)
MCHC: 34.4 g/dL (ref 30.0–36.0)
MCV: 91.6 fL (ref 78.0–100.0)
PLATELETS: 133 10*3/uL — AB (ref 150–400)
RBC: 3.81 MIL/uL — ABNORMAL LOW (ref 3.87–5.11)
RDW: 15.7 % — AB (ref 11.5–15.5)
WBC: 10.2 10*3/uL (ref 4.0–10.5)

## 2016-07-11 LAB — URIC ACID: URIC ACID, SERUM: 5.6 mg/dL (ref 2.3–6.6)

## 2016-07-11 LAB — PROTEIN / CREATININE RATIO, URINE
CREATININE, URINE: 165 mg/dL
PROTEIN CREATININE RATIO: 0.23 mg/mg{creat} — AB (ref 0.00–0.15)
Total Protein, Urine: 38 mg/dL

## 2016-07-11 NOTE — MAU Note (Signed)
Urine sent to lab 

## 2016-07-11 NOTE — H&P (Addendum)
Colin BentonMary K Mccurley is a 36 y.o. female at 37+ weeks (EDD 07/30/16 by 10 week US as LMP unsure) presenting for scheduled iol due to gestational hypertension. Pt's blood pressure has been increasing gradually over last few weeks. Last week had PReE labs done which were negative ( 24 hr protein was 145). At appt in office today pt with generalized malaise, swelling in hands and face and noted 5 lb weight gain in past week along with DBP of 92.  She begun her prenatal care  In first trimester, dating based on 10 week ultrasound.  At [redacted] weeks gestation she had significant pruritis so lfts and bile acids checked; had one abnl value that later resolved to normal range. Treated with ursodiol and later vistaril - itching resolved on own after failed medical treatment. She also needed medication for reflux. No other complications R/B of iol reveiwed with patient in office today Had GDM with last pregnancy. SVD at 40weeks  Maternal Medical History:  Reason for admission: Nausea. Gestational hypertension  Contractions: Frequency: rare.   Perceived severity is mild.    Fetal activity: Perceived fetal activity is normal.   Last perceived fetal movement was within the past hour.    Prenatal complications: PIH.   Prenatal Complications - Diabetes: none.    OB History    No data available     Past Medical History  Diagnosis Date  . Anxiety     panic attack  . Migraine   . Depression    Past Surgical History  Procedure Laterality Date  . Cholecystectomy  10/02/11   Family History: family history includes Cancer in her maternal grandmother and paternal grandmother; Diabetes in her father and mother; Heart disease in her father and mother; Hyperlipidemia in her father and mother. Social History:  reports that she has never smoked. She has never used smokeless tobacco. She reports that she does not drink alcohol or use illicit drugs.   Prenatal Transfer Tool  Maternal Diabetes: No Genetic Screening:  Normal Maternal Ultrasounds/Referrals: Normal Fetal Ultrasounds or other Referrals:  None Maternal Substance Abuse:  No Significant Maternal Medications:  None Significant Maternal Lab Results:  Lab values include: Group B Strep negative Other Comments:  None  Review of Systems  Constitutional: Positive for malaise/fatigue. Negative for fever, chills and weight loss.  Eyes: Negative for blurred vision and double vision.  Respiratory: Negative for shortness of breath.   Cardiovascular: Positive for leg swelling. Negative for chest pain and palpitations.  Gastrointestinal: Negative for heartburn, nausea, vomiting and abdominal pain.  Genitourinary: Negative for dysuria.  Musculoskeletal: Negative for back pain.  Skin: Negative for itching and rash.  Neurological: Negative for dizziness, focal weakness and headaches.  Psychiatric/Behavioral: Negative for depression, suicidal ideas, hallucinations and substance abuse. The patient is not nervous/anxious.       There were no vitals taken for this visit. Maternal Exam:  Uterine Assessment: Contraction strength is mild.  Contraction frequency is rare.   Abdomen: Patient reports no abdominal tenderness. Fundal height is 38cm.   Fetal presentation: vertex  Introitus: Normal vulva. Normal vagina.  Pelvis: adequate for delivery.   Cervix: Cervix evaluated by digital exam.     Physical Exam  Constitutional: She appears well-developed.  HENT:  Head: Normocephalic.  Neck: Normal range of motion.  Cardiovascular: Normal rate.   Respiratory: Effort normal.  Genitourinary: Vagina normal and uterus normal.  Musculoskeletal: Normal range of motion. She exhibits edema (face, hands).  Neurological: [name] alert.  Skin: Skin  is warm.  Psychiatric: She has a normal mood and affect. Her behavior is normal. Judgment and thought content normal.    Prenatal labs: ABO, Rh:  O negative Antibody:   negative  Rubella:  Immune RPR:   NR HBsAg:    Neg HIV:  NR  GBS:   Negative Panorama low risk HgbAA One hour GCT 126   Assessment/Plan: G3P1011 at 81 2/[redacted]wks gestation with gestational hypertension for iol  Plan to ripen with cytotec; pitocin if indicated in active labor; AROM when able PreE labs Monitor BP and if indicated MgSO4 Pain control prn Anticate svd   [employer] 07/11/2016, 3:13 PM

## 2016-07-11 NOTE — MAU Note (Signed)
Pt presents for pe-eclampsia work up. States her blood pressure has been trending upward in the appointments. Denies HA. Denies visual changes. Reports good fetal movement.

## 2016-07-12 ENCOUNTER — Inpatient Hospital Stay (HOSPITAL_COMMUNITY): Payer: BLUE CROSS/BLUE SHIELD | Admitting: Anesthesiology

## 2016-07-12 ENCOUNTER — Encounter (HOSPITAL_COMMUNITY): Payer: Self-pay | Admitting: Anesthesiology

## 2016-07-12 DIAGNOSIS — Z8249 Family history of ischemic heart disease and other diseases of the circulatory system: Secondary | ICD-10-CM | POA: Diagnosis not present

## 2016-07-12 DIAGNOSIS — O139 Gestational [pregnancy-induced] hypertension without significant proteinuria, unspecified trimester: Secondary | ICD-10-CM | POA: Diagnosis present

## 2016-07-12 DIAGNOSIS — O133 Gestational [pregnancy-induced] hypertension without significant proteinuria, third trimester: Secondary | ICD-10-CM | POA: Diagnosis present

## 2016-07-12 DIAGNOSIS — Z833 Family history of diabetes mellitus: Secondary | ICD-10-CM | POA: Diagnosis not present

## 2016-07-12 DIAGNOSIS — K219 Gastro-esophageal reflux disease without esophagitis: Secondary | ICD-10-CM | POA: Diagnosis present

## 2016-07-12 DIAGNOSIS — O9962 Diseases of the digestive system complicating childbirth: Secondary | ICD-10-CM | POA: Diagnosis present

## 2016-07-12 DIAGNOSIS — O134 Gestational [pregnancy-induced] hypertension without significant proteinuria, complicating childbirth: Secondary | ICD-10-CM | POA: Diagnosis present

## 2016-07-12 DIAGNOSIS — Z3A37 37 weeks gestation of pregnancy: Secondary | ICD-10-CM | POA: Diagnosis not present

## 2016-07-12 DIAGNOSIS — O4593 Premature separation of placenta, unspecified, third trimester: Secondary | ICD-10-CM | POA: Diagnosis present

## 2016-07-12 LAB — HEPATIC FUNCTION PANEL
ALBUMIN: 3.1 g/dL — AB (ref 3.5–5.0)
ALBUMIN: 2.7 g/dL — AB (ref 3.5–5.0)
ALK PHOS: 122 U/L (ref 38–126)
ALT: 15 U/L (ref 14–54)
ALT: 16 U/L (ref 14–54)
AST: 21 U/L (ref 15–41)
AST: 20 U/L (ref 15–41)
Alkaline Phosphatase: 117 U/L (ref 38–126)
Bilirubin, Direct: <0.1 mg/dL — ABNORMAL LOW (ref 0.1–0.5)
Bilirubin, Direct: <0.1 mg/dL — ABNORMAL LOW (ref 0.1–0.5)
TOTAL PROTEIN: 6.1 g/dL — AB (ref 6.5–8.1)
TOTAL PROTEIN: 5.6 g/dL — AB (ref 6.5–8.1)
Total Bilirubin: 0.5 mg/dL (ref 0.3–1.2)
Total Bilirubin: 0.4 mg/dL (ref 0.3–1.2)

## 2016-07-12 LAB — PLATELET COUNT
PLATELETS: 121 10*3/uL — AB (ref 150–400)
Platelets: 118 10*3/uL — ABNORMAL LOW (ref 150–400)

## 2016-07-12 LAB — PROTEIN / CREATININE RATIO, URINE
Creatinine, Urine: 209 mg/dL
PROTEIN CREATININE RATIO: 0.15 mg/mg{creat} (ref 0.00–0.15)
TOTAL PROTEIN, URINE: 31 mg/dL

## 2016-07-12 LAB — URIC ACID: URIC ACID, SERUM: 5.4 mg/dL (ref 2.3–6.6)

## 2016-07-12 LAB — LACTATE DEHYDROGENASE: LDH: 155 U/L (ref 98–192)

## 2016-07-12 LAB — RPR: RPR: NONREACTIVE

## 2016-07-12 MED ORDER — EPHEDRINE 5 MG/ML INJ
10.0000 mg | INTRAVENOUS | Status: DC | PRN
  Filled 2016-07-12: qty 2

## 2016-07-12 MED ORDER — DIPHENHYDRAMINE HCL 25 MG PO CAPS: 25.0000 mg | ORAL_CAPSULE | Freq: Four times a day (QID) | ORAL | Status: DC | PRN

## 2016-07-12 MED ORDER — TERBUTALINE SULFATE 1 MG/ML IJ SOLN
0.2500 mg | Freq: Once | INTRAMUSCULAR | Status: DC | PRN
  Filled 2016-07-12: qty 1

## 2016-07-12 MED ORDER — DIPHENHYDRAMINE HCL 50 MG/ML IJ SOLN: 12.5000 mg | INTRAMUSCULAR | Status: DC | PRN

## 2016-07-12 MED ORDER — TETANUS-DIPHTH-ACELL PERTUSSIS 5-2.5-18.5 LF-MCG/0.5 IM SUSP: 0.5000 mL | Freq: Once | INTRAMUSCULAR | Status: DC

## 2016-07-12 MED ORDER — LACTATED RINGERS IV SOLN: 500.0000 mL | INTRAVENOUS | Status: DC | PRN

## 2016-07-12 MED ORDER — PHENYLEPHRINE 40 MCG/ML (10ML) SYRINGE FOR IV PUSH (FOR BLOOD PRESSURE SUPPORT)
PREFILLED_SYRINGE | INTRAVENOUS | Status: AC
  Filled 2016-07-12: qty 10

## 2016-07-12 MED ORDER — FENTANYL CITRATE (PF) 100 MCG/2ML IJ SOLN
100.0000 ug | INTRAMUSCULAR | Status: DC | PRN
  Administered 2016-07-12: 100 ug via INTRAVENOUS

## 2016-07-12 MED ORDER — ONDANSETRON HCL 4 MG/2ML IJ SOLN
4.0000 mg | Freq: Four times a day (QID) | INTRAMUSCULAR | Status: DC | PRN
Start: 2016-07-12 — End: 2016-07-12

## 2016-07-12 MED ORDER — FENTANYL 2.5 MCG/ML BUPIVACAINE 1/10 % EPIDURAL INFUSION (WH - ANES)
14.0000 mL/h | INTRAMUSCULAR | Status: DC | PRN
  Administered 2016-07-12: 14 mL/h via EPIDURAL

## 2016-07-12 MED ORDER — ONDANSETRON HCL 4 MG/2ML IJ SOLN: 4.0000 mg | INTRAMUSCULAR | Status: DC | PRN

## 2016-07-12 MED ORDER — OXYCODONE-ACETAMINOPHEN 5-325 MG PO TABS: 2.0000 | ORAL_TABLET | ORAL | Status: DC | PRN

## 2016-07-12 MED ORDER — OXYTOCIN 40 UNITS IN LACTATED RINGERS INFUSION - SIMPLE MED
1.0000 m[IU]/min | INTRAVENOUS | Status: DC
  Filled 2016-07-12: qty 1000

## 2016-07-12 MED ORDER — PHENYLEPHRINE 40 MCG/ML (10ML) SYRINGE FOR IV PUSH (FOR BLOOD PRESSURE SUPPORT)
80.0000 ug | PREFILLED_SYRINGE | INTRAVENOUS | Status: DC | PRN
  Filled 2016-07-12: qty 5

## 2016-07-12 MED ORDER — OXYTOCIN 40 UNITS IN LACTATED RINGERS INFUSION - SIMPLE MED: 2.5000 [IU]/h | INTRAVENOUS | Status: DC

## 2016-07-12 MED ORDER — ZOLPIDEM TARTRATE 5 MG PO TABS: 5.0000 mg | ORAL_TABLET | Freq: Every evening | ORAL | Status: DC | PRN

## 2016-07-12 MED ORDER — FENTANYL 2.5 MCG/ML BUPIVACAINE 1/10 % EPIDURAL INFUSION (WH - ANES)
INTRAMUSCULAR | Status: AC
  Filled 2016-07-12: qty 125

## 2016-07-12 MED ORDER — ACETAMINOPHEN 325 MG PO TABS
650.0000 mg | ORAL_TABLET | ORAL | Status: DC | PRN
Start: 2016-07-12 — End: 2016-07-14

## 2016-07-12 MED ORDER — OXYCODONE HCL 5 MG PO TABS: 10.0000 mg | ORAL_TABLET | ORAL | Status: DC | PRN

## 2016-07-12 MED ORDER — LACTATED RINGERS IV SOLN: 500.0000 mL | Freq: Once | INTRAVENOUS | Status: DC

## 2016-07-12 MED ORDER — LIDOCAINE HCL (PF) 1 % IJ SOLN
30.0000 mL | INTRAMUSCULAR | Status: DC | PRN
  Filled 2016-07-12: qty 30

## 2016-07-12 MED ORDER — OXYCODONE-ACETAMINOPHEN 5-325 MG PO TABS
1.0000 | ORAL_TABLET | ORAL | Status: DC | PRN
Start: 2016-07-12 — End: 2016-07-12

## 2016-07-12 MED ORDER — LIDOCAINE HCL (PF) 1 % IJ SOLN
INTRAMUSCULAR | Status: DC | PRN
  Administered 2016-07-12 (×2): 7 mL via EPIDURAL

## 2016-07-12 MED ORDER — COCONUT OIL OIL: 1.0000 "application " | TOPICAL_OIL | Status: DC | PRN

## 2016-07-12 MED ORDER — ONDANSETRON HCL 4 MG PO TABS: 4.0000 mg | ORAL_TABLET | ORAL | Status: DC | PRN

## 2016-07-12 MED ORDER — LACTATED RINGERS IV SOLN
INTRAVENOUS | Status: DC
  Administered 2016-07-12: 01:00:00 via INTRAVENOUS

## 2016-07-12 MED ORDER — SIMETHICONE 80 MG PO CHEW: 80.0000 mg | CHEWABLE_TABLET | ORAL | Status: DC | PRN

## 2016-07-12 MED ORDER — SENNOSIDES-DOCUSATE SODIUM 8.6-50 MG PO TABS
2.0000 | ORAL_TABLET | ORAL | Status: DC
  Administered 2016-07-12 – 2016-07-13 (×2): 2 via ORAL
  Filled 2016-07-12 (×2): qty 2

## 2016-07-12 MED ORDER — IBUPROFEN 600 MG PO TABS
600.0000 mg | ORAL_TABLET | Freq: Four times a day (QID) | ORAL | Status: DC
  Administered 2016-07-12 – 2016-07-14 (×7): 600 mg via ORAL
  Filled 2016-07-12 (×7): qty 1

## 2016-07-12 MED ORDER — SOD CITRATE-CITRIC ACID 500-334 MG/5ML PO SOLN: 30.0000 mL | ORAL | Status: DC | PRN

## 2016-07-12 MED ORDER — BENZOCAINE-MENTHOL 20-0.5 % EX AERO
1.0000 "application " | INHALATION_SPRAY | CUTANEOUS | Status: DC | PRN
  Administered 2016-07-12: 1 via TOPICAL
  Filled 2016-07-12: qty 56

## 2016-07-12 MED ORDER — DIBUCAINE 1 % RE OINT
1.0000 "application " | TOPICAL_OINTMENT | RECTAL | Status: DC | PRN
  Administered 2016-07-13: 1 via RECTAL
  Filled 2016-07-12: qty 28

## 2016-07-12 MED ORDER — OXYTOCIN 40 UNITS IN LACTATED RINGERS INFUSION - SIMPLE MED
1.0000 m[IU]/min | INTRAVENOUS | Status: DC
  Administered 2016-07-12: 2 m[IU]/min via INTRAVENOUS

## 2016-07-12 MED ORDER — OXYTOCIN BOLUS FROM INFUSION: 500.0000 mL | INTRAVENOUS | Status: DC

## 2016-07-12 MED ORDER — WITCH HAZEL-GLYCERIN EX PADS
1.0000 "application " | MEDICATED_PAD | CUTANEOUS | Status: DC | PRN
  Administered 2016-07-13 (×2): 1 via TOPICAL

## 2016-07-12 MED ORDER — ALBUTEROL SULFATE (2.5 MG/3ML) 0.083% IN NEBU: 3.0000 mL | INHALATION_SOLUTION | RESPIRATORY_TRACT | Status: DC | PRN

## 2016-07-12 MED ORDER — ACETAMINOPHEN 325 MG PO TABS: 650.0000 mg | ORAL_TABLET | ORAL | Status: DC | PRN

## 2016-07-12 MED ORDER — MISOPROSTOL 25 MCG QUARTER TABLET
25.0000 ug | ORAL_TABLET | ORAL | Status: DC | PRN
  Administered 2016-07-12 (×2): 25 ug via VAGINAL
  Filled 2016-07-12: qty 0.25
  Filled 2016-07-12: qty 1
  Filled 2016-07-12: qty 0.25

## 2016-07-12 MED ORDER — PRENATAL MULTIVITAMIN CH
1.0000 | ORAL_TABLET | Freq: Every day | ORAL | Status: DC
  Administered 2016-07-13: 1 via ORAL
  Filled 2016-07-12: qty 1

## 2016-07-12 MED ORDER — FENTANYL CITRATE (PF) 100 MCG/2ML IJ SOLN
INTRAMUSCULAR | Status: AC
  Filled 2016-07-12: qty 2

## 2016-07-12 MED ORDER — OXYCODONE HCL 5 MG PO TABS: 5.0000 mg | ORAL_TABLET | ORAL | Status: DC | PRN

## 2016-07-12 NOTE — Anesthesia Procedure Notes (Signed)
Epidural Patient location during procedure: OB Start time: 07/12/2016 10:52 AM End time: 07/12/2016 10:56 AM  Staffing Anesthesiologist: Leilani AbleHATCHETT, Minervia Osso Performed by: anesthesiologist   Preanesthetic Checklist Completed: patient identified, surgical consent, pre-op evaluation, timeout performed, IV checked, risks and benefits discussed and monitors and equipment checked  Epidural Patient position: sitting Prep: site prepped and draped and DuraPrep Patient monitoring: continuous pulse ox and blood pressure Approach: midline Location: L3-L4 Injection technique: LOR air  Needle:  Needle type: Tuohy  Needle gauge: 17 G Needle length: 9 cm and 9 Needle insertion depth: 5 cm cm Catheter type: closed end flexible Catheter size: 19 Gauge Catheter at skin depth: 10 cm Test dose: negative and Other  Assessment Sensory level: T9 Events: blood not aspirated, injection not painful, no injection resistance, negative IV test and no paresthesia  Additional Notes Reason for block:procedure for pain

## 2016-07-12 NOTE — Progress Notes (Signed)
Joanne BentonMary K Molina is a 36 y.o. G2P1001 at 2383w3d by ultrasound admitted for induction of labor due to Hypertension.  Subjective:   Objective: BP 106/60 mmHg  Pulse 88  Temp(Src) 98.1 F (36.7 C) (Oral)  Resp 18  Ht 5\' 6"  (1.676 m)  Wt 184 lb (83.462 kg)  BMI 29.71 kg/m2  LMP 09/30/2015      FHT:  FHR: 145 bpm, variability: moderate,  accelerations:  Present,  decelerations:  Absent UC:   regular, every 4 minutes SVE:   Dilation: 1 Effacement (%): 50 Station: -3 Exam by:: Dr. Mindi SlickerBanga  Labs: Lab Results  Component Value Date   WBC 10.2 07/11/2016   HGB 12.0 07/11/2016   HCT 34.9* 07/11/2016   MCV 91.6 07/11/2016   PLT 133* 07/11/2016    Assessment / Plan: Induction of labor due to gestational hypertension,  progressing well on pitocin  Labor: s/p cytetec x 1; new dose just placed Preeclampsia:  will obtain serial labs; MgSO4 if indicated; bp stable at this time Fetal Wellbeing:  Category I Pain Control:  Labor support without medications ; rates contractios at 3/10 at this time I/D:  n/a Anticipated MOD:  NSVD  Joanne Molina 07/12/2016, 5:20 AM

## 2016-07-12 NOTE — Progress Notes (Signed)
Patient ID: Joanne BentonMary K Molina, female   DOB: [birth year], 36 y.o.   MRN: [id number] Pt comfortable with epidural afeb vss FHR Category 1  Cervix 60/3-4/-2  IUPC placed to adjust pitocin BP stable, labs stable Follow progress.

## 2016-07-12 NOTE — Progress Notes (Signed)
Transfer to birthing suites

## 2016-07-12 NOTE — Anesthesia Pain Management Evaluation Note (Signed)
  CRNA Pain Management Visit Note  Patient: Joanne BentonMary K Molina, 36 y.o., female  "Hello I am a member of the anesthesia team at [facility]. We have an anesthesia team available at all times to provide care throughout the hospital, including epidural management and anesthesia for C-section. I don't know your plan for the delivery whether it a natural birth, water birth, IV sedation, nitrous supplementation, doula or epidural, but we want to meet your pain goals."   1.Was your pain managed to your expectations on prior hospitalizations?   Yes   2.What is your expectation for pain management during this hospitalization?     Epidural and IV pain meds  3.How can we help you reach that goal?          i  Record the patient's initial score and the patient's pain goal.   Pain: 1  Pain Goal: 5 [facility] wants you to be able to say your pain was always managed very well.  Payden Bonus 07/12/2016

## 2016-07-12 NOTE — Progress Notes (Signed)
Patient ID: Joanne BentonMary K Burford, female   DOB: Feb 04, 1980, 36 y.o.   MRN: [id number] Pt states contractions a bit more intense, but not unbearable  afeb  BP stable  123/85 Labs repeated this AM and stable platelets 120K  FHR Category 1  Cervix 50/2/-2 AROM clear  Will begin pitocin Epidural prn PIH but BP stable and labs WNL

## 2016-07-12 NOTE — Anesthesia Preprocedure Evaluation (Signed)
Anesthesia Evaluation  Patient identified by MRN, date of birth, ID band Patient awake    Reviewed: Allergy & Precautions, H&P , NPO status , Patient's Chart, lab work & pertinent test results  Airway Mallampati: I  TM Distance: >3 FB Neck ROM: full    Dental no notable dental hx.    Pulmonary neg pulmonary ROS,    Pulmonary exam normal        Cardiovascular Normal cardiovascular exam     Neuro/Psych    GI/Hepatic negative GI ROS, Neg liver ROS,   Endo/Other  negative endocrine ROS  Renal/GU negative Renal ROS     Musculoskeletal   Abdominal Normal abdominal exam  (+)   Peds  Hematology negative hematology ROS (+)   Anesthesia Other Findings   Reproductive/Obstetrics (+) Pregnancy                             Anesthesia Physical Anesthesia Plan  ASA: II  Anesthesia Plan: Epidural   Post-op Pain Management:    Induction:   Airway Management Planned:   Additional Equipment:   Intra-op Plan:   Post-operative Plan:   Informed Consent: I have reviewed the patients History and Physical, chart, labs and discussed the procedure including the risks, benefits and alternatives for the proposed anesthesia with the patient or authorized representative who has indicated his/her understanding and acceptance.     Plan Discussed with:   Anesthesia Plan Comments:         Anesthesia Quick Evaluation

## 2016-07-13 LAB — CBC
HEMATOCRIT: 29 % — AB (ref 36.0–46.0)
HEMOGLOBIN: 10.1 g/dL — AB (ref 12.0–15.0)
MCH: 31.8 pg (ref 26.0–34.0)
MCHC: 34.8 g/dL (ref 30.0–36.0)
MCV: 91.2 fL (ref 78.0–100.0)
Platelets: 127 10*3/uL — ABNORMAL LOW (ref 150–400)
RBC: 3.18 MIL/uL — ABNORMAL LOW (ref 3.87–5.11)
RDW: 15.7 % — ABNORMAL HIGH (ref 11.5–15.5)
WBC: 10.5 10*3/uL (ref 4.0–10.5)

## 2016-07-13 LAB — CCBB MATERNAL DONOR DRAW

## 2016-07-13 MED ORDER — RHO D IMMUNE GLOBULIN 1500 UNIT/2ML IJ SOSY
300.0000 ug | PREFILLED_SYRINGE | Freq: Once | INTRAMUSCULAR | Status: AC
  Administered 2016-07-13: 300 ug via INTRAVENOUS
  Filled 2016-07-13: qty 2

## 2016-07-13 NOTE — Progress Notes (Addendum)
Post Partum Day 1 Subjective: no complaints, up ad lib, voiding, tolerating PO and nl lochia, pain controlled  Objective: Blood pressure 113/74, pulse 96, temperature 98 F (36.7 C), temperature source Oral, resp. rate 18, height 5\' 6"  (1.676 m), weight 83.462 kg (184 lb), last menstrual period 09/30/2015, SpO2 100 %, unknown if currently breastfeeding.  Physical Exam:  General: alert and no distress Lochia: appropriate Uterine Fundus: firm   Recent Labs  07/12/16 0648 07/13/16 0540  HGB 11.4* 10.1*  HCT 33.1* 29.0*    Assessment/Plan: Plan for discharge tomorrow.  Routine care.  D/c tomorrow.     LOS: 1 day   Bovard-Stuckert, Nasiir Monts 07/13/2016, 8:11 AM

## 2016-07-13 NOTE — Anesthesia Postprocedure Evaluation (Signed)
Anesthesia Post Note  Patient: Joanne BentonMary K Molina  Procedure(s) Performed: * No procedures listed *  Patient location during evaluation: Mother Baby Anesthesia Type: Epidural Level of consciousness: awake and alert Pain management: pain level controlled Vital Signs Assessment: post-procedure vital signs reviewed and stable Respiratory status: spontaneous breathing, nonlabored ventilation and respiratory function stable Cardiovascular status: stable Postop Assessment: no headache, no backache and epidural receding Anesthetic complications: no     Last Vitals:  Filed Vitals:   07/12/16 2100 07/13/16 0544  BP: 110/69 113/74  Pulse: 97 96  Temp: 37.1 C 36.7 C  Resp: 20 18    Last Pain:  Filed Vitals:   07/13/16 0545  PainSc: 0-No pain   Pain Goal:                 Junious SilkGILBERT,Emalene Welte

## 2016-07-13 NOTE — Progress Notes (Signed)
[name] was referred for history of depression/anxiety.  Per chart review and notes: Anxiety DX as a teen with medication intervention for panic attacks. No anxiety symptoms noted during pregnancy or prenatal record. Patient plans and reports she does well managing anxiety when on medication, will resume with outpatient provider.   Referral is screened out by Clinical Social Worker because none of the following criteria appear to apply: -History of anxiety/depression during this pregnancy, or of post-partum depression. - Diagnosis of anxiety and/or depression within last 3 years - History of depression due to pregnancy loss/loss of child or -[name]'s symptoms are currently being treated with medication and/or therapy.  Please contact the Clinical Social Worker if needs arise or upon [name] request.    Lenzie Montesano LCSW, MSW Clinical Social Work: System Wide Float Coverage for [facility] N[facility] Clinical social worker [phone] 

## 2016-07-14 LAB — RH IG WORKUP (INCLUDES ABO/RH)
ABO/RH(D): O NEG
FETAL SCREEN: NEGATIVE
Gestational Age(Wks): 37.3
UNIT DIVISION: 0

## 2016-07-14 MED ORDER — PRENATAL MULTIVITAMIN CH: 1.0000 | ORAL_TABLET | Freq: Every day | ORAL | Status: DC

## 2016-07-14 MED ORDER — IBUPROFEN 800 MG PO TABS: 800.0000 mg | ORAL_TABLET | Freq: Three times a day (TID) | ORAL | Status: DC | PRN

## 2016-07-14 NOTE — Progress Notes (Addendum)
Post Partum Day 2 Subjective: no complaints, up ad lib, voiding, tolerating PO and nl lochia, pain controlled  Objective: Blood pressure 115/73, pulse 91, temperature 98.4 F (36.9 C), temperature source Oral, resp. rate 18, height 5\' 6"  (1.676 m), weight 83.462 kg (184 lb), last menstrual period 09/30/2015, SpO2 100 %, unknown if currently breastfeeding.  Physical Exam:  General: alert and no distress Lochia: appropriate Uterine Fundus: firm   Recent Labs  07/12/16 0648 07/13/16 0540  HGB 11.4* 10.1*  HCT 33.1* 29.0*    Assessment/Plan: Discharge home, Breastfeeding and Lactation consult.  D/c with Motrin and PNV.  F/u 6 weeks.  BP check in 2 weeks   LOS: 2 days   Bovard-Stuckert, Joanne Molina 07/14/2016, 8:03 AM

## 2016-07-14 NOTE — Discharge Summary (Signed)
OB Discharge Summary     Patient Name: Joanne Molina DOB: Jul 29, 1980 MRN: [id number]  Date of admission: 07/11/2016 Delivering MD: Huel Cote   Date of discharge: 07/14/2016  Admitting diagnosis: 37.5w hbp, pressure, swelling, work up per dr Intrauterine pregnancy: [redacted]w[redacted]d     Secondary diagnosis:  Active Problems:   Gestational hypertension   Gestational hypertension w/o significant proteinuria in 3rd trimester  Additional problems: N/A     Discharge diagnosis: Term Pregnancy Delivered                                                                                                Post partum procedures:N/A  Augmentation: AROM, Pitocin and Cytotec  Complications: None  Hospital course:  Induction of Labor With Vaginal Delivery   36 y.o. yo G2P2002 at [redacted]w[redacted]d was admitted to the hospital 07/11/2016 for induction of labor.  Indication for induction: Gestational hypertension.  Patient had an uncomplicated labor course as follows: Membrane Rupture Time/Date: 8:37 AM ,07/12/2016   Intrapartum Procedures: Episiotomy: None [1]                                         Lacerations:  None [1]  Patient had delivery of a Viable infant.  Information for the patient's newborn:  Eddye, Broxterman [[id number]]  Delivery Method: Vaginal, Spontaneous Delivery (Filed from Delivery Summary)   07/12/2016  Details of delivery can be found in separate delivery note.  Patient had a routine postpartum course. Patient is discharged home 07/14/2016.   Physical exam  Filed Vitals:   07/13/16 0544 07/13/16 1842 07/13/16 1914 07/14/16 0554  BP: 113/74 128/75  115/73  Pulse: 96 117 94 91  Temp: 98 F (36.7 C) 99 F (37.2 C)  98.4 F (36.9 C)  TempSrc: Oral Oral  Oral  Resp: Height:      Weight:      SpO2:       General: alert and no distress Lochia: appropriate Uterine Fundus: firm  Labs: Lab Results  Component Value Date   WBC 10.5 07/13/2016   HGB 10.1* 07/13/2016   HCT  29.0* 07/13/2016   MCV 91.2 07/13/2016   PLT 127* 07/13/2016   CMP Latest Ref Rng 07/12/2016  Glucose 65 - 99 mg/dL -  BUN 6 - 20 mg/dL -  Creatinine 7.82 - 9.56 mg/dL -  Sodium 213 - 086 mmol/L -  Potassium 3.5 - 5.1 mmol/L -  Chloride 101 - 111 mmol/L -  CO2 22 - 32 mmol/L -  Calcium 8.9 - 10.3 mg/dL -  Total Protein 6.5 - 8.1 g/dL 5.7(Q)  Total Bilirubin 0.3 - 1.2 mg/dL 0.4  Alkaline Phos 38 - 126 U/L 117  AST 15 - 41 U/L 20  ALT 14 - 54 U/L 16    Discharge instruction: per After Visit Summary and "Baby and Me Booklet".  After visit meds:    Medication List    TAKE these medications  albuterol 108 (90 Base) MCG/ACT inhaler  Commonly known as:  PROVENTIL HFA;VENTOLIN HFA  Inhale 2 puffs into the lungs every 4 (four) hours as needed for wheezing or shortness of breath (cough, shortness of breath or wheezing.).     docusate sodium 100 MG capsule  Commonly known as:  COLACE  Take 100 mg by mouth at bedtime.     ferrous sulfate 325 (65 FE) MG tablet  Take 325 mg by mouth at bedtime.     ibuprofen 800 MG tablet  Commonly known as:  ADVIL,MOTRIN  Take 1 tablet (800 mg total) by mouth every 8 (eight) hours as needed for moderate pain.     pantoprazole 40 MG tablet  Commonly known as:  PROTONIX  Take 40 mg by mouth daily.     prenatal multivitamin Tabs tablet  Take 1 tablet by mouth at bedtime.        Diet: routine diet  Activity: Advance as tolerated. Pelvic rest for 6 weeks.   Outpatient follow up:2 and 6 weeks Follow up Appt:No future appointments. Follow up Visit:No Follow-up on file.  Postpartum contraception: Not Discussed  Newborn Data: Live born female  Birth Weight: 6 lb 8.9 oz (2975 g) APGAR: 4, 9  Baby Feeding: Bottle Disposition:home with mother   07/14/2016 Sherian ReinBovard-Stuckert, Rashi Granier, MD

## 2016-07-15 LAB — TYPE AND SCREEN
ABO/RH(D): O NEG
ANTIBODY SCREEN: POSITIVE
DAT, IgG: NEGATIVE
UNIT DIVISION: 0
UNIT DIVISION: 0

## 2016-07-16 ENCOUNTER — Inpatient Hospital Stay (HOSPITAL_COMMUNITY)
Admission: AD | Admit: 2016-07-16 | Discharge: 2016-07-17 | Disposition: A | Discharge status: Home / Self Care | Payer: BLUE CROSS/BLUE SHIELD | Source: Ambulatory Visit | Attending: Obstetrics and Gynecology | Admitting: Obstetrics and Gynecology

## 2016-07-16 ENCOUNTER — Encounter (HOSPITAL_COMMUNITY): Payer: Self-pay | Admitting: *Deleted

## 2016-07-16 DIAGNOSIS — F329 Major depressive disorder, single episode, unspecified: Secondary | ICD-10-CM | POA: Insufficient documentation

## 2016-07-16 DIAGNOSIS — G44209 Tension-type headache, unspecified, not intractable: Secondary | ICD-10-CM | POA: Insufficient documentation

## 2016-07-16 DIAGNOSIS — G44219 Episodic tension-type headache, not intractable: Secondary | ICD-10-CM | POA: Diagnosis not present

## 2016-07-16 DIAGNOSIS — G43909 Migraine, unspecified, not intractable, without status migrainosus: Secondary | ICD-10-CM | POA: Diagnosis not present

## 2016-07-16 DIAGNOSIS — R519 Headache, unspecified: Secondary | ICD-10-CM

## 2016-07-16 DIAGNOSIS — F419 Anxiety disorder, unspecified: Secondary | ICD-10-CM | POA: Insufficient documentation

## 2016-07-16 DIAGNOSIS — O9089 Other complications of the puerperium, not elsewhere classified: Secondary | ICD-10-CM

## 2016-07-16 DIAGNOSIS — R51 Headache: Secondary | ICD-10-CM

## 2016-07-16 LAB — COMPREHENSIVE METABOLIC PANEL
ALBUMIN: 3.1 g/dL — AB (ref 3.5–5.0)
ALT: 44 U/L (ref 14–54)
AST: 38 U/L (ref 15–41)
Alkaline Phosphatase: 97 U/L (ref 38–126)
Anion gap: 6 (ref 5–15)
BUN: 14 mg/dL (ref 6–20)
CALCIUM: 8.8 mg/dL — AB (ref 8.9–10.3)
CHLORIDE: 105 mmol/L (ref 101–111)
CO2: 26 mmol/L (ref 22–32)
Creatinine, Ser: 0.89 mg/dL (ref 0.44–1.00)
GFR calc Af Amer: >60 mL/min (ref >60)
GFR calc non Af Amer: >60 mL/min (ref >60)
Glucose, Bld: 97 mg/dL (ref 65–99)
POTASSIUM: 3.8 mmol/L (ref 3.5–5.1)
Sodium: 137 mmol/L (ref 135–145)
TOTAL PROTEIN: 6 g/dL — AB (ref 6.5–8.1)
Total Bilirubin: 0.4 mg/dL (ref 0.3–1.2)

## 2016-07-16 LAB — CBC
HCT: 29.3 % — ABNORMAL LOW (ref 36.0–46.0)
Hemoglobin: 10 g/dL — ABNORMAL LOW (ref 12.0–15.0)
MCH: 31.4 pg (ref 26.0–34.0)
MCHC: 34.1 g/dL (ref 30.0–36.0)
MCV: 92.1 fL (ref 78.0–100.0)
PLATELETS: 190 10*3/uL (ref 150–400)
RBC: 3.18 MIL/uL — AB (ref 3.87–5.11)
RDW: 15.3 % (ref 11.5–15.5)
WBC: 10.6 10*3/uL — AB (ref 4.0–10.5)

## 2016-07-16 MED ORDER — BUTALBITAL-APAP-CAFFEINE 50-325-40 MG PO TABS
2.0000 | ORAL_TABLET | Freq: Once | ORAL | Status: AC
  Administered 2016-07-17: 2 via ORAL
  Filled 2016-07-16: qty 2

## 2016-07-16 MED ORDER — DIPHENHYDRAMINE HCL 25 MG PO CAPS
25.0000 mg | ORAL_CAPSULE | Freq: Once | ORAL | Status: AC
  Administered 2016-07-17: 25 mg via ORAL
  Filled 2016-07-16: qty 1

## 2016-07-16 NOTE — MAU Note (Signed)
Pt is s/p vaginal delivery on 07/13. States today she has had a headache and her vision has been blurred. States her bleeding increased and she started passing clots today and felt like she was going to pass out several times. Denies pain other than a headache at this time.

## 2016-07-16 NOTE — MAU Provider Note (Signed)
History     CSN: [id number]  Arrival date and time: 07/16/16 2303   First Provider Initiated Contact with Patient 07/16/16 2340      Chief Complaint  Patient presents with  . Headache   HPI Comments: Joanne Molina is a 36 y.o. G2P2002 who is S/P NSVD on 07/12/16. She reports that she started to develop a headache this morning. She took ibuprofen this morning, and the headache was manageable until this evening. She did not take any ibuprofen this evening. When the headache became worse she started to experience blurry vision. She checked her blood pressure at home and it was 161/93. When EMS first checked her blood pressure it ws 150/90, and reports that her blood pressure was "normal" during the ride over.   Headache  This is a new problem. The current episode started today. The problem occurs constantly. The problem has been unchanged. The pain is located in the vertex region. The pain does not radiate. The quality of the pain is described as throbbing. The pain is at a severity of 6/10. Associated symptoms include blurred vision, photophobia and a visual change. Pertinent negatives include no abdominal pain, fever, nausea, phonophobia or vomiting. She has tried NSAIDs (took this morning, but has not had any since early am. ) for the symptoms. The treatment provided no relief.   Past Medical History  Diagnosis Date  . Anxiety     panic attack  . Migraine   . Depression     Past Surgical History  Procedure Laterality Date  . Cholecystectomy  10/02/11    Family History  Problem Relation Age of Onset  . Diabetes Mother   . Hyperlipidemia Mother   . Heart disease Mother   . Diabetes Father   . Hyperlipidemia Father   . Heart disease Father   . Cancer Maternal Grandmother     colon and breast  . Cancer Paternal Grandmother     colon    Social History  Substance Use Topics  . Smoking status: Never Smoker   . Smokeless tobacco: Never Used  . Alcohol Use: No    Allergies:   Allergies  Allergen Reactions  . Erythromycin Nausea Only and Other (See Comments)    REACTION: Upset GI    Prescriptions prior to admission  Medication Sig Dispense Refill Last Dose  . docusate sodium (COLACE) 100 MG capsule Take 100 mg by mouth at bedtime.   07/16/2016 at Unknown time  . ferrous sulfate 325 (65 FE) MG tablet Take 325 mg by mouth at bedtime.   07/15/2016 at Unknown time  . ibuprofen (ADVIL,MOTRIN) 800 MG tablet Take 1 tablet (800 mg total) by mouth every 8 (eight) hours as needed for moderate pain. 40 tablet 1 07/16/2016 at Unknown time  . pantoprazole (PROTONIX) 40 MG tablet Take 40 mg by mouth daily.   1 07/15/2016 at Unknown time  . Prenatal Vit-Fe Fumarate-FA (PRENATAL MULTIVITAMIN) TABS tablet Take 1 tablet by mouth at bedtime. 30 tablet 3 07/16/2016 at Unknown time  . albuterol (PROVENTIL HFA;VENTOLIN HFA) 108 (90 BASE) MCG/ACT inhaler Inhale 2 puffs into the lungs every 4 (four) hours as needed for wheezing or shortness of breath (cough, shortness of breath or wheezing.). 1 Inhaler 1 More than a month at Unknown time    Review of Systems  Constitutional: Negative for fever and chills.  Eyes: Positive for blurred vision and photophobia.  Gastrointestinal: Negative for nausea, vomiting and abdominal pain.  Neurological: Positive for headaches.  Physical Exam   Blood pressure 129/67, pulse 89, temperature 98.9 F (37.2 C), temperature source Oral, resp. rate 16, last menstrual period 09/30/2015, SpO2 100 %, unknown if currently breastfeeding.  Physical Exam  Nursing note and vitals reviewed. Constitutional: [name] oriented to person, place, and time. She appears well-developed and well-nourished. No distress.  HENT:  Head: Normocephalic.  Cardiovascular: Normal rate.   Respiratory: Effort normal.  GI: Soft. There is no tenderness. There is no rebound.  Neurological: [name] alert and oriented to person, place, and time.  Skin: Skin is warm and dry.   Psychiatric: She has a normal mood and affect.    Results for orders placed or performed during the hospital encounter of 07/16/16 (from the past 24 hour(s))  CBC     Status: Abnormal   Collection Time: 07/16/16 11:20 PM  Result Value Ref Range   WBC 10.6 (H) 4.0 - 10.5 K/uL   RBC 3.18 (L) 3.87 - 5.11 MIL/uL   Hemoglobin 10.0 (L) 12.0 - 15.0 g/dL   HCT 16.1 (L) 09.6 - 04.5 %   MCV 92.1 78.0 - 100.0 fL   MCH 31.4 26.0 - 34.0 pg   MCHC 34.1 30.0 - 36.0 g/dL   RDW 40.9 81.1 - 91.4 %   Platelets 190 150 - 400 K/uL  Comprehensive metabolic panel     Status: Abnormal   Collection Time: 07/16/16 11:20 PM  Result Value Ref Range   Sodium 137 135 - 145 mmol/L   Potassium 3.8 3.5 - 5.1 mmol/L   Chloride 105 101 - 111 mmol/L   CO2 26 22 - 32 mmol/L   Glucose, Bld 97 65 - 99 mg/dL   BUN 14 6 - 20 mg/dL   Creatinine, Ser 7.82 0.44 - 1.00 mg/dL   Calcium 8.8 (L) 8.9 - 10.3 mg/dL   Total Protein 6.0 (L) 6.5 - 8.1 g/dL   Albumin 3.1 (L) 3.5 - 5.0 g/dL   AST 38 15 - 41 U/L   ALT 44 14 - 54 U/L   Alkaline Phosphatase 97 38 - 126 U/L   Total Bilirubin 0.4 0.3 - 1.2 mg/dL   GFR calc non Af Amer >60 >60 mL/min   GFR calc Af Amer >60 >60 mL/min   Anion gap 6 5 - 15  Urinalysis, Routine w reflex microscopic (not at [facility])     Status: Abnormal   Collection Time: 07/16/16 11:55 PM  Result Value Ref Range   Color, Urine YELLOW YELLOW   APPearance CLEAR CLEAR   Specific Gravity, Urine 1.010 1.005 - 1.030   pH 7.0 5.0 - 8.0   Glucose, UA NEGATIVE NEGATIVE mg/dL   Hgb urine dipstick TRACE (A) NEGATIVE   Bilirubin Urine NEGATIVE NEGATIVE   Ketones, ur NEGATIVE NEGATIVE mg/dL   Protein, ur NEGATIVE NEGATIVE mg/dL   Nitrite NEGATIVE NEGATIVE   Leukocytes, UA NEGATIVE NEGATIVE  Protein / creatinine ratio, urine     Status: None   Collection Time: 07/16/16 11:55 PM  Result Value Ref Range   Creatinine, Urine 34.00 mg/dL   Total Protein, Urine <6 mg/dL   Protein Creatinine Ratio        0.00 -  0.15 mg/mg[Cre]  Urine microscopic-add on     Status: Abnormal   Collection Time: 07/16/16 11:55 PM  Result Value Ref Range   Squamous Epithelial / LPF 0-5 (A) NONE SEEN   WBC, UA 0-5 0 - 5 WBC/hpf   RBC / HPF 0-5 0 - 5 RBC/hpf  Bacteria, UA RARE (A) NONE SEEN     MAU Course  Procedures  MDM 6295: Patient reports that her headache has improved with fioricet and benadryl. She rates her pain 3/10 at this time.  0109: D/W Dr. Sherren Mocha for DC home. Will send home with fioricet for headaches.   Assessment and Plan   1. Episodic tension-type headache, not intractable   2. Postpartum headache    DC home Comfort measures reviewed  RX: fioricet PRN #[id number]  Return to MAU as needed FU with OB as planned  Follow-up Information    Follow up with Zenaida Niece, MD.   Specialty:  Obstetrics and Gynecology   Why:  As scheduled   Contact information:   [address], [address] [place] [address] [phone]         Tawnya Crook 07/17/2016, 1:12 AM

## 2016-07-17 DIAGNOSIS — G44219 Episodic tension-type headache, not intractable: Secondary | ICD-10-CM

## 2016-07-17 DIAGNOSIS — R1030 Lower abdominal pain, unspecified: Secondary | ICD-10-CM | POA: Diagnosis not present

## 2016-07-17 DIAGNOSIS — G4489 Other headache syndrome: Secondary | ICD-10-CM | POA: Diagnosis not present

## 2016-07-17 LAB — URINE MICROSCOPIC-ADD ON

## 2016-07-17 LAB — URINALYSIS, ROUTINE W REFLEX MICROSCOPIC
Bilirubin Urine: NEGATIVE
Glucose, UA: NEGATIVE mg/dL
KETONES UR: NEGATIVE mg/dL
LEUKOCYTES UA: NEGATIVE
NITRITE: NEGATIVE
PH: 7 (ref 5.0–8.0)
PROTEIN: NEGATIVE mg/dL
Specific Gravity, Urine: 1.01 (ref 1.005–1.030)

## 2016-07-17 LAB — PROTEIN / CREATININE RATIO, URINE: Creatinine, Urine: 34 mg/dL

## 2016-07-17 MED ORDER — BUTALBITAL-APAP-CAFFEINE 50-325-40 MG PO TABS: 1.0000 | ORAL_TABLET | Freq: Four times a day (QID) | ORAL | Status: DC | PRN

## 2016-07-17 NOTE — Discharge Instructions (Signed)

## 2016-07-19 ENCOUNTER — Telehealth: Payer: Self-pay | Admitting: Family Medicine

## 2016-07-19 NOTE — Telephone Encounter (Signed)
 [facility] - Client TELEPHONE ADVICE RECORD [facility] Patient Name: Joanne Molina DOB: Sep 10, 1980 Initial Comment Ardeen GarlandWanda Albright states Joanne Molina she just had a baby last Thursday. She has done nothing but cry since having the baby. [name] concerned about post partum depression. Nurse Assessment Nurse: Debera Latalston, RN, Tinnie GensJeffrey Date/Time Lamount Cohen(Eastern Time): 07/19/2016 2:51:21 PM Confirm and document reason for call. If symptomatic, describe symptoms. You must click the next button to save text entered. ---Ardeen GarlandWanda Albright states Joanne Molina she just had a baby last Thursday. She has done nothing but cry since having the baby. [name] concerned about post partum depression. Previous history of depression and on Prozac. Has the patient traveled out of the country within the last 30 days? ---No Does the patient have any new or worsening symptoms? ---Yes Will a triage be completed? ---Yes Related visit to physician within the last 2 weeks? ---Yes Does the PT have any chronic conditions? (i.e. diabetes, asthma, etc.) ---Yes List chronic conditions. ---Depression and anxiety Is the patient pregnant or possibly pregnant? (Ask all females between the ages of 4412-55) ---No Is this a behavioral health or substance abuse call? ---No Guidelines Guideline Title Affirmed Question Affirmed Notes Postpartum - Depression Symptoms interfere with work, school, or care of baby Final Disposition User See Physician within 24 Hours KivalinaRalston, RN, Abbott LaboratoriesJeffrey Referrals REFERRED TO PCP OFFICE REFERRED TO PCP OFFICE Disagree/Comply: Comply

## 2016-07-19 NOTE — Telephone Encounter (Signed)
Pt need new Rx for prozac

## 2016-07-20 ENCOUNTER — Encounter: Payer: Self-pay | Admitting: Adult Health

## 2016-07-20 ENCOUNTER — Ambulatory Visit (INDEPENDENT_AMBULATORY_CARE_PROVIDER_SITE_OTHER): Payer: BLUE CROSS/BLUE SHIELD | Admitting: Adult Health

## 2016-07-20 VITALS — BP 108/90 | Temp 99.1°F | Wt 163.1 lb

## 2016-07-20 DIAGNOSIS — F411 Generalized anxiety disorder: Secondary | ICD-10-CM | POA: Diagnosis not present

## 2016-07-20 MED ORDER — FLUOXETINE HCL 40 MG PO CAPS: 40.0000 mg | ORAL_CAPSULE | Freq: Every day | ORAL | Status: DC

## 2016-07-20 NOTE — Telephone Encounter (Signed)
Rx refilled during visit today 07/20/16

## 2016-07-20 NOTE — Patient Instructions (Signed)
It was great seeing you again.   I am sorry you are going through this. I have sent in a prescription for Prozac 40 mg. Take this daily.   Follow up with me in 4 weeks or sooner if you need anything  Of course if you have any suicidal thoughts, stop the medication and go to the ER.

## 2016-07-20 NOTE — Progress Notes (Addendum)
Subjective:    Patient ID: Joanne Molina, female    DOB: 1980/12/24, 37 y.o.   MRN: [id number]  HPI   36 year old female who is 6 days postpartum. She reports that she is feeling anxious and scared " I feel terrified of everything" She has been waking up in the middle of the night feeling scared and short of breath. [name] crying throughout the day. And she feels like she has become more " ill tempered" towards her 72 year old child.   [name] bonding with her newborn   [name] not breast feeding and does not plan to do so.   She has taken prozac for " 16 years" but once she found out she was pregnant she stopped cold [place] and has not taken it since.  Reports good results with Prozac  She feels as though this is more anxiety than anything else, does not feel depressed.   She denies any SI or harm towards her children.   Her OB/GYN is Pryor Ochoa, DO  Review of Systems  Constitutional: Positive for activity change.  Respiratory: Negative.   Cardiovascular: Negative.   Psychiatric/Behavioral: Positive for behavioral problems, decreased concentration and agitation. Negative for suicidal ideas and self-injury. The patient is nervous/anxious.   All other systems reviewed and are negative.  Past Medical History  Diagnosis Date  . Anxiety     panic attack  . Migraine   . Depression     Social History   Social History  . Marital Status: Married    Spouse Name: N/A  . Number of Children: N/A  . Years of Education: N/A   Occupational History  . Not on file.   Social History Main Topics  . Smoking status: Never Smoker   . Smokeless tobacco: Never Used  . Alcohol Use: No  . Drug Use: No  . Sexual Activity: Not Currently    Birth Control/ Protection: Diaphragm   Other Topics Concern  . Not on file   Social History Narrative    Past Surgical History  Procedure Laterality Date  . Cholecystectomy  10/02/11    Family History  Problem Relation Age of Onset  .  Diabetes Mother   . Hyperlipidemia Mother   . Heart disease Mother   . Diabetes Father   . Hyperlipidemia Father   . Heart disease Father   . Cancer Maternal Grandmother     colon and breast  . Cancer Paternal Grandmother     colon    Allergies  Allergen Reactions  . Erythromycin Nausea Only and Other (See Comments)    REACTION: Upset GI    Current Outpatient Prescriptions on File Prior to Visit  Medication Sig Dispense Refill  . albuterol (PROVENTIL HFA;VENTOLIN HFA) 108 (90 BASE) MCG/ACT inhaler Inhale 2 puffs into the lungs every 4 (four) hours as needed for wheezing or shortness of breath (cough, shortness of breath or wheezing.). 1 Inhaler 1  . butalbital-acetaminophen-caffeine (FIORICET) 50-325-40 MG tablet Take 1-2 tablets by mouth every 6 (six) hours as needed for headache. 20 tablet 0  . docusate sodium (COLACE) 100 MG capsule Take 100 mg by mouth at bedtime.    Marland Kitchen ibuprofen (ADVIL,MOTRIN) 800 MG tablet Take 1 tablet (800 mg total) by mouth every 8 (eight) hours as needed for moderate pain. 40 tablet 1  . pantoprazole (PROTONIX) 40 MG tablet Take 40 mg by mouth daily.   1  . Prenatal Vit-Fe Fumarate-FA (PRENATAL MULTIVITAMIN) TABS tablet Take  1 tablet by mouth at bedtime. 30 tablet 3   No current facility-administered medications on file prior to visit.    BP 108/90 mmHg  Temp(Src) 99.1 F (37.3 C) (Oral)  Wt 163 lb 1.6 oz (73.982 kg)  LMP 09/30/2015        Objective:   Physical Exam  Constitutional: [name] oriented to person, place, and time. She appears well-developed and well-nourished. No distress.  Cardiovascular: Normal rate, regular rhythm, normal heart sounds and intact distal pulses.  Exam reveals no gallop and no friction rub.   No murmur heard. Pulmonary/Chest: Effort normal and breath sounds normal. No respiratory distress. She has no wheezes. She has no rales. She exhibits no tenderness.  Neurological: [name] alert and oriented to person, place, and  time.  Skin: Skin is warm and dry. No rash noted. [name] not diaphoretic. No erythema. No pallor.  Psychiatric: She has a normal mood and affect. Her behavior is normal. Judgment and thought content normal.  Nursing note and vitals reviewed.     Assessment & Plan:  1. Generalized anxiety disorder - Appears as more of an anxiety related issue then postpartum depression.  - Will place her back on Prozac.  - FLUoxetine (PROZAC) 40 MG capsule; Take 1 capsule (40 mg total) by mouth daily.  Dispense: 90 capsule; Refill: 1 - She will consider psych referral.  - Follow up in 4 weeks or sooner if needed - Advised to go to the ER with any thoughts of SI or harm to baby  Joanne Freesory Aviana Shevlin, NP

## 2016-08-02 LAB — CBC WITH DIFFERENTIAL/PLATELET
BASOS ABS: 0 10*3/uL (ref 0.0–0.1)
BASOS PCT: 0 %
EOS PCT: 1 %
Eosinophils Absolute: 0.1 10*3/uL (ref 0.0–0.7)
HEMATOCRIT: 33.1 % — AB (ref 36.0–46.0)
HEMOGLOBIN: 11.4 g/dL — AB (ref 12.0–15.0)
LYMPHS ABS: 1.5 10*3/uL (ref 0.7–4.0)
Lymphocytes Relative: 17 %
MCH: 31.5 pg (ref 26.0–34.0)
MCHC: 34.4 g/dL (ref 30.0–36.0)
MCV: 91.4 fL (ref 78.0–100.0)
MONO ABS: 0.4 10*3/uL (ref 0.1–1.0)
Monocytes Relative: 4 %
NEUTROS ABS: 7 10*3/uL (ref 1.7–7.7)
NEUTROS PCT: 78 %
Platelets: 120 10*3/uL — ABNORMAL LOW (ref 150–400)
RBC: 3.62 MIL/uL — ABNORMAL LOW (ref 3.87–5.11)
RDW: 15.8 % — AB (ref 11.5–15.5)
WBC: 9 10*3/uL (ref 4.0–10.5)

## 2016-11-26 ENCOUNTER — Encounter (HOSPITAL_COMMUNITY): Payer: Self-pay | Admitting: Emergency Medicine

## 2016-11-26 ENCOUNTER — Ambulatory Visit (HOSPITAL_COMMUNITY)
Admission: EM | Admit: 2016-11-26 | Discharge: 2016-11-26 | Disposition: A | Discharge status: Home / Self Care | Payer: BLUE CROSS/BLUE SHIELD | Attending: Emergency Medicine | Admitting: Emergency Medicine

## 2016-11-26 DIAGNOSIS — O878 Other venous complications in the puerperium: Secondary | ICD-10-CM | POA: Diagnosis not present

## 2016-11-26 DIAGNOSIS — O874 Varicose veins of lower extremity in the puerperium: Secondary | ICD-10-CM

## 2016-11-26 NOTE — ED Triage Notes (Signed)
PT reports for the past four months she has had unexplained bruising over right lower leg and pain that comes and goes. PT delivevered a baby four months ago and had an emergency delivery due to HTN and severe edema.

## 2016-11-26 NOTE — Discharge Instructions (Signed)
I suspect this is a small varicose vein. If you can tolerate it, wearing compression socks can help. Otherwise, Tylenol or ibuprofen for the achiness. Warm compresses can also help. Follow-up with your PCP to see about a referral to a vein specialist.

## 2016-11-26 NOTE — ED Provider Notes (Signed)
[facility]    CSN: [id number] Arrival date & time: 11/26/16  1056     History   Chief Complaint Chief Complaint  Patient presents with  . Leg Pain    HPI Joanne Molina is a 36 y.o. female.   HPI  [name] a 36 year old woman here for evaluation of right leg problem. She had a baby about 4 months ago. She ended up having a baby early due to hypertension and severe swelling. She states it took several weeks for the swelling to go back down. Since the delivery, she has had intermittent "bruising" on her right medial lower leg. Associated with a mild aching sensation. Wearing socks over it makes the pain worse. It is also worse with prolonged standing. She has not tried anything.  Past Medical History:  Diagnosis Date  . Anxiety    panic attack  . Depression   . Migraine     Patient Active Problem List   Diagnosis Date Noted  . Gestational hypertension 07/12/2016  . Gestational hypertension w/o significant proteinuria in 3rd trimester 07/12/2016  . Urinary frequency 09/09/2012  . Biliary colic 08/30/2011  . Abdominal pain, chronic, right upper quadrant 08/22/2011  . BLURRED VISION 11/22/2007    Past Surgical History:  Procedure Laterality Date  . CHOLECYSTECTOMY  10/02/11    OB History    Gravida Para Term Preterm AB Living   2 2 2     2    SAB TAB Ectopic Multiple Live Births         0 2       Home Medications    Prior to Admission medications   Medication Sig Start Date End Date Taking? Authorizing Provider  FLUoxetine (PROZAC) 40 MG capsule Take 1 capsule (40 mg total) by mouth daily. 07/20/16  Yes Shirline Freesory Nafziger, NP  albuterol (PROVENTIL HFA;VENTOLIN HFA) 108 (90 BASE) MCG/ACT inhaler Inhale 2 puffs into the lungs every 4 (four) hours as needed for wheezing or shortness of breath (cough, shortness of breath or wheezing.). 10/17/15   Tishira R Brewington, PA-C  ibuprofen (ADVIL,MOTRIN) 800 MG tablet Take 1 tablet (800 mg total) by mouth every 8  (eight) hours as needed for moderate pain. 07/14/16   Sherian ReinJody Bovard-Stuckert, MD    Family History Family History  Problem Relation Age of Onset  . Diabetes Mother   . Hyperlipidemia Mother   . Heart disease Mother   . Diabetes Father   . Hyperlipidemia Father   . Heart disease Father   . Cancer Maternal Grandmother     colon and breast  . Cancer Paternal Grandmother     colon    Social History Social History  Substance Use Topics  . Smoking status: Never Smoker  . Smokeless tobacco: Never Used  . Alcohol use No     Allergies   Erythromycin   Review of Systems Review of Systems As in history of present illness  Physical Exam Triage Vital Signs ED Triage Vitals  Enc Vitals Group     BP 11/26/16 1208 136/93     Pulse Rate 11/26/16 1208 73     Resp 11/26/16 1208 16     Temp 11/26/16 1208 98.2 F (36.8 C)     Temp Source 11/26/16 1208 Oral     SpO2 11/26/16 1208 100 %     Weight 11/26/16 1210 145 lb (65.8 kg)     Height 11/26/16 1210 5\' 6"  (1.676 m)     Head Circumference --  Peak Flow --      Pain Score 11/26/16 1212 2     Pain Loc --      Pain Edu? --      Excl. in GC? --    No data found.   Updated Vital Signs BP 136/93   Pulse 73   Temp 98.2 F (36.8 C) (Oral)   Resp 16   Ht 5\' 6"  (1.676 m)   Wt 145 lb (65.8 kg)   LMP 11/05/2016   SpO2 100%   BMI 23.40 kg/m   Visual Acuity Right Eye Distance:   Left Eye Distance:   Bilateral Distance:    Right Eye Near:   Left Eye Near:    Bilateral Near:     Physical Exam  Constitutional: [name] oriented to person, place, and time. She appears well-developed and well-nourished. No distress.  Cardiovascular: Normal rate.   Pulmonary/Chest: Effort normal.  Musculoskeletal:       Legs: Neurological: [name] alert and oriented to person, place, and time.     UC Treatments / Results  Labs (all labs ordered are listed, but only abnormal results are displayed) Labs Reviewed - No data to  display  EKG  EKG Interpretation None       Radiology No results found.  Procedures Procedures (including critical care time)  Medications Ordered in UC Medications - No data to display   Initial Impression / Assessment and Plan / UC Course  I have reviewed the triage vital signs and the nursing notes.  Pertinent labs & imaging results that were available during my care of the patient were reviewed by me and considered in my medical decision making (see chart for details).  Clinical Course     I suspect this is a very small varicose vein. Symptomatic treatment with warm compresses, Tylenol, or ibuprofen. Follow-up with PCP for possible referral to pain specialist if continues to be bothersome.  Final Clinical Impressions(s) / UC Diagnoses   Final diagnoses:  Varicose vein of leg, postpartum    New Prescriptions Discharge Medication List as of 11/26/2016 12:23 PM       Joanne RingsErin J Karsynn Deweese, MD 11/26/16 1243

## 2016-12-30 DIAGNOSIS — J209 Acute bronchitis, unspecified: Secondary | ICD-10-CM | POA: Diagnosis not present

## 2016-12-30 DIAGNOSIS — R062 Wheezing: Secondary | ICD-10-CM | POA: Diagnosis not present

## 2016-12-30 DIAGNOSIS — R0602 Shortness of breath: Secondary | ICD-10-CM | POA: Diagnosis not present

## 2017-01-04 ENCOUNTER — Ambulatory Visit (HOSPITAL_COMMUNITY)
Admission: EM | Admit: 2017-01-04 | Discharge: 2017-01-04 | Disposition: A | Discharge status: Home / Self Care | Payer: BLUE CROSS/BLUE SHIELD | Attending: Emergency Medicine | Admitting: Emergency Medicine

## 2017-01-04 ENCOUNTER — Emergency Department (HOSPITAL_COMMUNITY)
Admission: EM | Admit: 2017-01-04 | Discharge: 2017-01-05 | Disposition: A | Discharge status: Home / Self Care | Payer: BLUE CROSS/BLUE SHIELD | Attending: Emergency Medicine | Admitting: Emergency Medicine

## 2017-01-04 ENCOUNTER — Encounter (HOSPITAL_COMMUNITY): Payer: Self-pay | Admitting: *Deleted

## 2017-01-04 ENCOUNTER — Ambulatory Visit (INDEPENDENT_AMBULATORY_CARE_PROVIDER_SITE_OTHER): Payer: BLUE CROSS/BLUE SHIELD

## 2017-01-04 DIAGNOSIS — S0180XA Unspecified open wound of other part of head, initial encounter: Secondary | ICD-10-CM | POA: Insufficient documentation

## 2017-01-04 DIAGNOSIS — J209 Acute bronchitis, unspecified: Secondary | ICD-10-CM

## 2017-01-04 DIAGNOSIS — Y929 Unspecified place or not applicable: Secondary | ICD-10-CM | POA: Insufficient documentation

## 2017-01-04 DIAGNOSIS — Y939 Activity, unspecified: Secondary | ICD-10-CM | POA: Insufficient documentation

## 2017-01-04 DIAGNOSIS — Y999 Unspecified external cause status: Secondary | ICD-10-CM | POA: Insufficient documentation

## 2017-01-04 DIAGNOSIS — X58XXXA Exposure to other specified factors, initial encounter: Secondary | ICD-10-CM | POA: Insufficient documentation

## 2017-01-04 DIAGNOSIS — Z79899 Other long term (current) drug therapy: Secondary | ICD-10-CM | POA: Insufficient documentation

## 2017-01-04 MED ORDER — LEVOFLOXACIN 750 MG PO TABS: 750.0000 mg | ORAL_TABLET | Freq: Every day | ORAL | 0 refills | Status: DC

## 2017-01-04 MED ORDER — PREDNISONE 50 MG PO TABS: 50.0000 mg | ORAL_TABLET | Freq: Every day | ORAL | 0 refills | Status: DC

## 2017-01-04 MED ORDER — PREDNISONE 20 MG PO TABS
ORAL_TABLET | ORAL | Status: AC
  Filled 2017-01-04: qty 1

## 2017-01-04 MED ORDER — IPRATROPIUM-ALBUTEROL 0.5-2.5 (3) MG/3ML IN SOLN
RESPIRATORY_TRACT | Status: AC
  Filled 2017-01-04: qty 3

## 2017-01-04 MED ORDER — PREDNISONE 20 MG PO TABS
20.0000 mg | ORAL_TABLET | Freq: Once | ORAL | Status: AC
  Administered 2017-01-04: 20 mg via ORAL

## 2017-01-04 MED ORDER — IPRATROPIUM-ALBUTEROL 0.5-2.5 (3) MG/3ML IN SOLN
3.0000 mL | Freq: Once | RESPIRATORY_TRACT | Status: AC
  Administered 2017-01-04: 3 mL via RESPIRATORY_TRACT

## 2017-01-04 MED ORDER — AEROCHAMBER PLUS MISC: 2 refills | Status: DC

## 2017-01-04 NOTE — ED Provider Notes (Signed)
HPI  SUBJECTIVE:  Joanne Molina is a 37 y.o. female who presents with cough productive of clear white phlegm for the past 3 weeks. She reports chest congestion, wheezing, chest tightness or night. She states that she feels that she can't fully exhale. She also reports fatigue, nasal congestion, postnasal drip. She has multiple family members with URI-like symptoms. She was seen in urgent care on Sunday, given a DuoNeb, Kenalog shot and a Z-Pak, cough medicine and albuterol inhaler. She states that she felt significantly better after the DuoNeb and the Kenalog. She has also been using her albuterol every 4 hours and some cough medicines without much improvement. She reports fevers Tmax 104-105 on Monday, which resolved on Wednesday. She states that she feels that she is "not getting better". She denies sinus pain or pressure, chest pain. No antipyretic in past 6-8 hours. She has a past medical history sinusitis, gestational diabetes, gestational hypertension and bronchitis. States this feels similar to previous episodes of bronchitis. No history of asthma, emphysema, COPD. [name] not a smoker. LMP: December. She denies possibility of being pregnant. WGN:FAOZ,HYQMVHQPMD:TODD,JEFFREY Freida BusmanALLEN, MD    Past Medical History:  Diagnosis Date  . Anxiety    panic attack  . Depression   . Migraine     Past Surgical History:  Procedure Laterality Date  . CHOLECYSTECTOMY  10/02/11    Family History  Problem Relation Age of Onset  . Diabetes Mother   . Hyperlipidemia Mother   . Heart disease Mother   . Diabetes Father   . Hyperlipidemia Father   . Heart disease Father   . Cancer Maternal Grandmother     colon and breast  . Cancer Paternal Grandmother     colon    Social History  Substance Use Topics  . Smoking status: Never Smoker  . Smokeless tobacco: Never Used  . Alcohol use No    No current facility-administered medications for this encounter.   Current Outpatient Prescriptions:  .  albuterol  (PROVENTIL HFA;VENTOLIN HFA) 108 (90 BASE) MCG/ACT inhaler, Inhale 2 puffs into the lungs every 4 (four) hours as needed for wheezing or shortness of breath (cough, shortness of breath or wheezing.)., Disp: 1 Inhaler, Rfl: 1 .  FLUoxetine (PROZAC) 40 MG capsule, Take 1 capsule (40 mg total) by mouth daily., Disp: 90 capsule, Rfl: 1 .  ibuprofen (ADVIL,MOTRIN) 800 MG tablet, Take 1 tablet (800 mg total) by mouth every 8 (eight) hours as needed for moderate pain., Disp: 40 tablet, Rfl: 1 .  levofloxacin (LEVAQUIN) 750 MG tablet, Take 1 tablet (750 mg total) by mouth daily. X 5 days, Disp: 5 tablet, Rfl: 0 .  predniSONE (DELTASONE) 50 MG tablet, Take 1 tablet (50 mg total) by mouth daily with breakfast., Disp: 5 tablet, Rfl: 0 .  Spacer/Aero-Holding Chambers (AEROCHAMBER PLUS) inhaler, Use as instructed, Disp: 1 each, Rfl: 2  Allergies  Allergen Reactions  . Erythromycin Nausea Only and Other (See Comments)    REACTION: Upset GI     ROS  As noted in HPI.   Physical Exam  BP 129/83 (BP Location: Left Arm)   Pulse 109   Temp 98.6 F (37 C) (Oral)   Resp 16   LMP 12/14/2016 (Approximate)   SpO2 99%   Constitutional: Well developed, well nourished, no acute distress Eyes:  EOMI, conjunctiva normal bilaterally HENT: Normocephalic, atraumatic,mucus membranes moist. No Nasal congestion. Normal turbinates. No appreciable postnasal drip or cobblestoning. Respiratory: Normal inspiratory effort. diffuse loud wheezing and rhonchi throughout  all lung fields. Positive chest wall tenderness. Cardiovascular: Regular tachycardia, no murmurs, rubs, gallops. GI: nondistended skin: No rash, skin intact Musculoskeletal: no deformities Neurologic: Alert & oriented x 3, no focal neuro deficits Psychiatric: Speech and behavior appropriate   ED Course   Medications  ipratropium-albuterol (DUONEB) 0.5-2.5 (3) MG/3ML nebulizer solution 3 mL (3 mLs Nebulization Given 01/04/17 1214)  predniSONE  (DELTASONE) tablet 20 mg (20 mg Oral Given 01/04/17 1213)    Orders Placed This Encounter  Procedures  . DG Chest 2 View    Standing Status:   Standing    Number of Occurrences:   1    Order Specific Question:   Reason for Exam (SYMPTOM  OR DIAGNOSIS REQUIRED)    Answer:   cough r/o PNA    No results found for this or any previous visit (from the past 24 hour(s)). Dg Chest 2 View  Result Date: 01/04/2017 CLINICAL DATA:  Cough and sickness for 3 weeks.  Fevers EXAM: CHEST  2 VIEW COMPARISON:  10/17/2015 FINDINGS: The heart size and mediastinal contours are within normal limits. Both lungs are clear. The visualized skeletal structures are unremarkable. IMPRESSION: No active cardiopulmonary disease. Electronically Signed   By: Signa Kell M.D.   On: 01/04/2017 11:12    ED Clinical Impression  Acute bronchitis, unspecified organism   ED Assessment/Plan  Reviewed imaging independently. Normal chest x-ray. See radiology report for full details.  Mild tachycardia, but vitals otherwise normal. This is most likely from the home albuterol. Satting well room air. This appears to be a bronchitis. Giving an DuoNeb here and initial dose of prednisone she states that this helped her significantly last time. Plan to send home with a prednisone burst, spacer for her albuterol for her to take on a regular basis and a wait-and-see prescription of Levaquin 750 for 5 days. She will follow-up with her primary care physician in several days. To the ER if gets worse.  Discussed maging, MDM, plan and followup with patient. Patient agrees with plan.   Meds ordered this encounter  Medications  . ipratropium-albuterol (DUONEB) 0.5-2.5 (3) MG/3ML nebulizer solution 3 mL  . predniSONE (DELTASONE) tablet 20 mg  . levofloxacin (LEVAQUIN) 750 MG tablet    Sig: Take 1 tablet (750 mg total) by mouth daily. X 5 days    Dispense:  5 tablet    Refill:  0  . Spacer/Aero-Holding Chambers (AEROCHAMBER PLUS) inhaler     Sig: Use as instructed    Dispense:  1 each    Refill:  2  . predniSONE (DELTASONE) 50 MG tablet    Sig: Take 1 tablet (50 mg total) by mouth daily with breakfast.    Dispense:  5 tablet    Refill:  0    *This clinic note was created using Scientist, clinical (histocompatibility and immunogenetics). Therefore, there may be occasional mistakes despite careful proofreading.  ?   Domenick Gong, MD 01/04/17 2217

## 2017-01-04 NOTE — Discharge Instructions (Signed)
2 puffs from your albuterol inhaler every 4-6 hours. Make sure you use a spacer. This will help deliver the medicine anterior lungs more effectively. Finish the prednisone. You may start it tomorrow as you have already taken today's dose. Fill the Levaquin if you're not getting better in several days. Do take it easy, no weight lifting or heavy physical activity for about 3 weeks after finishing the Levaquin as it has been shown to cause tendon rupture.

## 2017-01-04 NOTE — ED Triage Notes (Signed)
The pt is concerned  About a lesion to the lt side of her mouth for over one month and she has a similar lesion to the lt chin  She reports that it wiill not heal and it bleeds often.  lmp dec 18

## 2017-01-04 NOTE — ED Triage Notes (Signed)
Pt   reports   Has  Had  A  Cough  X  3  Weeks        Just  Finshed        z  Pack            Continues              Symptoms     Of  Fever   As   Well    Feels   Weak      Taking albuterol  As   well

## 2017-01-05 LAB — CBG MONITORING, ED: Glucose-Capillary: 107 mg/dL — ABNORMAL HIGH (ref 65–99)

## 2017-01-05 MED ORDER — CEPHALEXIN 500 MG PO CAPS: 500.0000 mg | ORAL_CAPSULE | Freq: Four times a day (QID) | ORAL | 0 refills | Status: DC

## 2017-01-05 NOTE — ED Notes (Signed)
ED Provider at bedside. 

## 2017-01-05 NOTE — Discharge Instructions (Signed)
Medications: Keflex  Treatment: Take Keflex 4 times daily for 5 days. You can also continue antibiotic ointment 1-2 times daily.  Follow-up: Please follow-up with dermatologist as soon as possible. You can also attempt to see your primary care provider, as some doctors are comfortable with biopsy. Please return to emergency department if you develop any new or worsening symptoms.

## 2017-01-05 NOTE — ED Provider Notes (Signed)
[facility]PT Provider Note   CSN: [id number] Arrival date & time: 01/04/17  2146     History   Chief Complaint Chief Complaint  Patient presents with  . Facial Pain    HPI Joanne Molina is a 37 y.o. female with history of acne who presents with a three-week history of ulceration to her left cheek. Patient reports the area began as a small bump that felt like a pimple. The area began with a small hole in draining clear fluid. Next, the patient reports the whole became larger and patient was able to pull out around a 2 inch hair-like substance. Since then, patient has been seeing streaky tissue, clear drainage, and blood from the area. Patient has been using antibiotic cream, including mupirocin without relief. The ulceration continues to grow and has not healed. Patient has had some discomfort and tenderness to the area. She denies extension into her mouth. Patient denies any tobacco use. She also denies any related fevers, night sweats, chest pain, shortness of breath, abdominal pain, nausea, vomiting.  HPI  Past Medical History:  Diagnosis Date  . Anxiety    panic attack  . Depression   . Migraine     Patient Active Problem List   Diagnosis Date Noted  . Gestational hypertension 07/12/2016  . Gestational hypertension w/o significant proteinuria in 3rd trimester 07/12/2016  . Urinary frequency 09/09/2012  . Biliary colic 08/30/2011  . Abdominal pain, chronic, right upper quadrant 08/22/2011  . BLURRED VISION 11/22/2007    Past Surgical History:  Procedure Laterality Date  . CHOLECYSTECTOMY  10/02/11    OB History    Gravida Para Term Preterm AB Living   2 2 2     2    SAB TAB Ectopic Multiple Live Births         0 2       Home Medications    Prior to Admission medications   Medication Sig Start Date End Date Taking? Authorizing Provider  albuterol (PROVENTIL HFA;VENTOLIN HFA) 108 (90 BASE) MCG/ACT inhaler Inhale 2 puffs into the lungs every 4 (four) hours  as needed for wheezing or shortness of breath (cough, shortness of breath or wheezing.). 10/17/15   Tishira R Brewington, PA-C  cephALEXin (KEFLEX) 500 MG capsule Take 1 capsule (500 mg total) by mouth 4 (four) times daily. 01/05/17   Emi Holes, PA-C  FLUoxetine (PROZAC) 40 MG capsule Take 1 capsule (40 mg total) by mouth daily. 07/20/16   Shirline Frees, NP  ibuprofen (ADVIL,MOTRIN) 800 MG tablet Take 1 tablet (800 mg total) by mouth every 8 (eight) hours as needed for moderate pain. 07/14/16   Sherian Rein, MD  levofloxacin (LEVAQUIN) 750 MG tablet Take 1 tablet (750 mg total) by mouth daily. X 5 days 01/04/17   Domenick Gong, MD  predniSONE (DELTASONE) 50 MG tablet Take 1 tablet (50 mg total) by mouth daily with breakfast. 01/04/17   Domenick Gong, MD  Spacer/Aero-Holding Chambers (AEROCHAMBER PLUS) inhaler Use as instructed 01/04/17   Domenick Gong, MD    Family History Family History  Problem Relation Age of Onset  . Diabetes Mother   . Hyperlipidemia Mother   . Heart disease Mother   . Diabetes Father   . Hyperlipidemia Father   . Heart disease Father   . Cancer Maternal Grandmother     colon and breast  . Cancer Paternal Grandmother     colon    Social History Social History  Substance Use Topics  . Smoking  status: Never Smoker  . Smokeless tobacco: Never Used  . Alcohol use No     Allergies   Erythromycin   Review of Systems Review of Systems  Constitutional: Negative for chills and fever.  HENT: Negative for facial swelling and sore throat.   Respiratory: Negative for shortness of breath.   Cardiovascular: Negative for chest pain.  Gastrointestinal: Negative for abdominal pain, nausea and vomiting.  Skin: Positive for wound. Negative for rash.  Psychiatric/Behavioral: The patient is not nervous/anxious.      Physical Exam Updated Vital Signs BP 115/76 (BP Location: Right Arm)   Pulse 88   Temp 98.5 F (36.9 C) (Oral)   Resp 22   Ht 5\' 6"   (1.676 m)   Wt 69.1 kg   LMP 12/14/2016 (Approximate)   SpO2 99%   BMI 24.58 kg/m   Physical Exam  Constitutional: She appears well-developed and well-nourished. No distress.  HENT:  Head: Normocephalic and atraumatic.  Mouth/Throat: Oropharynx is clear and moist. No oropharyngeal exudate.  Eyes: Conjunctivae are normal. Pupils are equal, round, and reactive to light. Right eye exhibits no discharge. Left eye exhibits no discharge. No scleral icterus.  Neck: Normal range of motion. Neck supple. No thyromegaly present.  Cardiovascular: Normal rate, regular rhythm, normal heart sounds and intact distal pulses.  Exam reveals no gallop and no friction rub.   No murmur heard. Pulmonary/Chest: Effort normal and breath sounds normal. No stridor. No respiratory distress. She has no wheezes. She has no rales.  Abdominal: Soft. Bowel sounds are normal. She exhibits no distension. There is no tenderness. There is no rebound and no guarding.  Musculoskeletal: She exhibits no edema.  Lymphadenopathy:    She has no cervical adenopathy.  Neurological: [name] alert. Coordination normal.  Skin: Skin is warm and dry. No rash noted. [name] not diaphoretic. No pallor.  1 cm ulceration to left face, 7 mm deep with probe, no discharge or fluctuance noted, mild to moderate tenderness to area tracking up towards maxillary bone  Psychiatric: She has a normal mood and affect.  Nursing note and vitals reviewed.    ED Treatments / Results  Labs (all labs ordered are listed, but only abnormal results are displayed) Labs Reviewed  CBG MONITORING, ED - Abnormal; Notable for the following:       Result Value   Glucose-Capillary 107 (*)    All other components within normal limits    EKG  EKG Interpretation None       Radiology Dg Chest 2 View  Result Date: 01/04/2017 CLINICAL DATA:  Cough and sickness for 3 weeks.  Fevers EXAM: CHEST  2 VIEW COMPARISON:  10/17/2015 FINDINGS: The heart size and  mediastinal contours are within normal limits. Both lungs are clear. The visualized skeletal structures are unremarkable. IMPRESSION: No active cardiopulmonary disease. Electronically Signed   By: Signa Kell M.D.   On: 01/04/2017 11:12    Procedures Procedures (including critical care time)  Medications Ordered in ED Medications - No data to display   Initial Impression / Assessment and Plan / ED Course  I have reviewed the triage vital signs and the nursing notes.  Pertinent labs & imaging results that were available during my care of the patient were reviewed by me and considered in my medical decision making (see chart for details).  Clinical Course     Patient with persistent facial ulceration. No obvious signs of infection, however will treat with Keflex. CBG 107. Patient needs follow-up with  dermatology for biopsy. I discussed this with patient. Patient also advised to follow-up with primary care provider. She can continue using topical antibiotic ointment as well. Return precautions discussed. Patient understands and agrees with plan. Patient vitals stable throughout ED course and discharged in satisfactory condition. I discussed patient case with Dr. Karma GanjaLinker who guided the patient's management and agrees with plan.  Final Clinical Impressions(s) / ED Diagnoses   Final diagnoses:  Open wound of face, initial encounter    New Prescriptions New Prescriptions   CEPHALEXIN (KEFLEX) 500 MG CAPSULE    Take 1 capsule (500 mg total) by mouth 4 (four) times daily.     Emi Holeslexandra M Amillion [place], PA-C 01/05/17 0211    Jerelyn ScottMartha Linker, MD 01/05/17 [phone]

## 2017-01-09 DIAGNOSIS — L723 Sebaceous cyst: Secondary | ICD-10-CM | POA: Diagnosis not present

## 2017-02-12 ENCOUNTER — Ambulatory Visit (INDEPENDENT_AMBULATORY_CARE_PROVIDER_SITE_OTHER): Payer: BLUE CROSS/BLUE SHIELD | Admitting: Adult Health

## 2017-02-12 ENCOUNTER — Encounter: Payer: Self-pay | Admitting: Adult Health

## 2017-02-12 DIAGNOSIS — F419 Anxiety disorder, unspecified: Secondary | ICD-10-CM | POA: Insufficient documentation

## 2017-02-12 MED ORDER — CLONAZEPAM 0.5 MG PO TABS: 0.5000 mg | ORAL_TABLET | Freq: Every day | ORAL | 0 refills | Status: DC

## 2017-02-12 NOTE — Patient Instructions (Addendum)
Dr. Evelene CroonKaur   Dr. Jacqulynn Cadetarey G. Bernardo Heaterottle Jr, MD  [facility]   [facility]

## 2017-02-12 NOTE — Progress Notes (Signed)
Subjective:    Patient ID: Joanne Molina, female    DOB: 30-Jul-1980, 37 y.o.   MRN: [id number]  HPI   37 year old female who presents to the office today for worsening increased anxiety attacks. She has had issues with anxiety since her 26's. When I last saw her in July, we decided to place her back on Prozac 40 mg. She reports doing well since then, except for the last two weeks. Where she was awoken From a sound sleep in "full blown panic attack". She reports that it was so bad that her husband called 911. She was not taken to the hospital and was able to calm herself down but it took her what she reports is a full 24 hours before she could feel like she wasn't anxious any longer. Week later she had a another episode of being awoken from a deep sleep into a panic attack. She did take one of her mother's Klonopin which helped relieve the anxiety she has not had any issues with panic attacks waking her up in the middle the night any longer.  She continues to take Prozac 40 mg  Review of Systems  Constitutional: Negative.   Respiratory: Negative.   Cardiovascular: Negative.   Gastrointestinal: Negative.   Skin: Negative.   Psychiatric/Behavioral: Positive for sleep disturbance. The patient is nervous/anxious.   All other systems reviewed and are negative.  Past Medical History:  Diagnosis Date  . Anxiety    panic attack  . Depression   . Migraine     Social History   Social History  . Marital status: Married    Spouse name: N/A  . Number of children: N/A  . Years of education: N/A   Occupational History  . Not on file.   Social History Main Topics  . Smoking status: Never Smoker  . Smokeless tobacco: Never Used  . Alcohol use No  . Drug use: No  . Sexual activity: Not Currently    Birth control/ protection: Diaphragm   Other Topics Concern  . Not on file   Social History Narrative  . No narrative on file    Past Surgical History:  Procedure Laterality Date  .  CHOLECYSTECTOMY  10/02/11    Family History  Problem Relation Age of Onset  . Diabetes Mother   . Hyperlipidemia Mother   . Heart disease Mother   . Diabetes Father   . Hyperlipidemia Father   . Heart disease Father   . Cancer Maternal Grandmother     colon and breast  . Cancer Paternal Grandmother     colon    Allergies  Allergen Reactions  . Erythromycin Nausea Only and Other (See Comments)    REACTION: Upset GI    Current Outpatient Prescriptions on File Prior to Visit  Medication Sig Dispense Refill  . albuterol (PROVENTIL HFA;VENTOLIN HFA) 108 (90 BASE) MCG/ACT inhaler Inhale 2 puffs into the lungs every 4 (four) hours as needed for wheezing or shortness of breath (cough, shortness of breath or wheezing.). 1 Inhaler 1  . FLUoxetine (PROZAC) 40 MG capsule Take 1 capsule (40 mg total) by mouth daily. 90 capsule 1  . ibuprofen (ADVIL,MOTRIN) 800 MG tablet Take 1 tablet (800 mg total) by mouth every 8 (eight) hours as needed for moderate pain. 40 tablet 1   No current facility-administered medications on file prior to visit.     BP 124/72   Temp 98.5 F (36.9 C) (Oral)   Ht 5\' 6"  (  1.676 m)   Wt 150 lb (68 kg)   BMI 24.21 kg/m       Objective:   Physical Exam  Constitutional: [name] oriented to person, place, and time. She appears well-developed and well-nourished. No distress.  Cardiovascular: Normal rate, regular rhythm, normal heart sounds and intact distal pulses.  Exam reveals no gallop and no friction rub.   No murmur heard. Pulmonary/Chest: Effort normal and breath sounds normal. No respiratory distress. She has no wheezes. She has no rales. She exhibits no tenderness.  Neurological: [name] alert and oriented to person, place, and time.  Skin: Skin is warm and dry. No rash noted. [name] not diaphoretic. No erythema. No pallor.  Psychiatric: She has a normal mood and affect. Her behavior is normal. Judgment and thought content normal.  Nursing note and vitals  reviewed.     Assessment & Plan:  1. Anxiety - clonazePAM (KLONOPIN) 0.5 MG tablet; Take 1 tablet (0.5 mg total) by mouth at bedtime.  Dispense: 20 tablet; Refill: 0 - Advised that she should follow-up with a psychiatrist for further evaluation of her anxiety. List of local psychaitrists given to patient. She will call make an appointment. In the meantime, will give short duration of Klonopin that she can use as needed to help control anxiety attacks.  Shirline Freesory Shivam Mestas, NP

## 2017-02-14 DIAGNOSIS — R002 Palpitations: Secondary | ICD-10-CM | POA: Diagnosis not present

## 2017-02-14 DIAGNOSIS — R079 Chest pain, unspecified: Secondary | ICD-10-CM | POA: Diagnosis not present

## 2017-02-14 DIAGNOSIS — R55 Syncope and collapse: Secondary | ICD-10-CM | POA: Diagnosis not present

## 2017-02-14 DIAGNOSIS — F419 Anxiety disorder, unspecified: Secondary | ICD-10-CM | POA: Diagnosis not present

## 2017-02-14 DIAGNOSIS — R404 Transient alteration of awareness: Secondary | ICD-10-CM | POA: Diagnosis not present

## 2017-02-14 DIAGNOSIS — F410 Panic disorder [episodic paroxysmal anxiety] without agoraphobia: Secondary | ICD-10-CM | POA: Diagnosis not present

## 2017-03-14 ENCOUNTER — Telehealth: Payer: Self-pay | Admitting: Family Medicine

## 2017-03-14 ENCOUNTER — Other Ambulatory Visit: Payer: Self-pay

## 2017-03-14 NOTE — Telephone Encounter (Signed)
Pt calling to check the status of the Rx and what pharmacy it was sent to.

## 2017-03-14 NOTE — Telephone Encounter (Signed)
Patient notified. Rx has been called in as directed.

## 2017-03-14 NOTE — Telephone Encounter (Signed)
See below and advise

## 2017-03-14 NOTE — Telephone Encounter (Signed)
She can have an additional refill. Psychiatry needs to take over this medication

## 2017-03-14 NOTE — Telephone Encounter (Signed)
Pt saw cory on 02-12-17. Pt needs a refill on klonopin [facility] [address]. Pt has an appt to see a psychiatry  On 03-21-17

## 2017-03-14 NOTE — Telephone Encounter (Signed)
I left message for patient via voicemail stating that Rx has been called into the pharmacy -  [facility] [address] [facility] [place]. Thanks!

## 2017-03-21 DIAGNOSIS — F411 Generalized anxiety disorder: Secondary | ICD-10-CM | POA: Diagnosis not present

## 2017-03-22 DIAGNOSIS — N938 Other specified abnormal uterine and vaginal bleeding: Secondary | ICD-10-CM | POA: Diagnosis not present

## 2017-03-22 DIAGNOSIS — N921 Excessive and frequent menstruation with irregular cycle: Secondary | ICD-10-CM | POA: Diagnosis not present

## 2017-03-22 DIAGNOSIS — N8302 Follicular cyst of left ovary: Secondary | ICD-10-CM | POA: Diagnosis not present

## 2017-03-22 DIAGNOSIS — N83202 Unspecified ovarian cyst, left side: Secondary | ICD-10-CM | POA: Diagnosis not present

## 2017-03-26 ENCOUNTER — Encounter: Payer: Self-pay | Admitting: Cardiovascular Disease

## 2017-03-26 ENCOUNTER — Telehealth: Payer: Self-pay | Admitting: Family Medicine

## 2017-03-26 ENCOUNTER — Encounter (HOSPITAL_COMMUNITY): Payer: Self-pay | Admitting: Emergency Medicine

## 2017-03-26 ENCOUNTER — Other Ambulatory Visit: Payer: Self-pay | Admitting: Family Medicine

## 2017-03-26 ENCOUNTER — Emergency Department (HOSPITAL_COMMUNITY): Payer: BLUE CROSS/BLUE SHIELD

## 2017-03-26 DIAGNOSIS — R002 Palpitations: Secondary | ICD-10-CM | POA: Diagnosis not present

## 2017-03-26 LAB — BASIC METABOLIC PANEL
Anion gap: 8 (ref 5–15)
BUN: 9 mg/dL (ref 6–20)
CHLORIDE: 105 mmol/L (ref 101–111)
CO2: 26 mmol/L (ref 22–32)
CREATININE: 0.96 mg/dL (ref 0.44–1.00)
Calcium: 9.1 mg/dL (ref 8.9–10.3)
GFR calc Af Amer: >60 mL/min (ref >60)
GFR calc non Af Amer: >60 mL/min (ref >60)
GLUCOSE: 100 mg/dL — AB (ref 65–99)
Potassium: 3.9 mmol/L (ref 3.5–5.1)
Sodium: 139 mmol/L (ref 135–145)

## 2017-03-26 LAB — I-STAT TROPONIN, ED: Troponin i, poc: 0 ng/mL (ref 0.00–0.08)

## 2017-03-26 LAB — CBC
HCT: 43.8 % (ref 36.0–46.0)
Hemoglobin: 14.9 g/dL (ref 12.0–15.0)
MCH: 30.9 pg (ref 26.0–34.0)
MCHC: 34 g/dL (ref 30.0–36.0)
MCV: 90.9 fL (ref 78.0–100.0)
PLATELETS: 234 10*3/uL (ref 150–400)
RBC: 4.82 MIL/uL (ref 3.87–5.11)
RDW: 12.8 % (ref 11.5–15.5)
WBC: 6.8 10*3/uL (ref 4.0–10.5)

## 2017-03-26 NOTE — ED Triage Notes (Signed)
Pt presents with palpitations that awakened her from sleep last night and progressively worsening; pt denies CP, sob, lightheadedness, dizziness

## 2017-03-26 NOTE — Telephone Encounter (Signed)
It is ok to refer her to cardiology per psychiatry recommendations

## 2017-03-26 NOTE — Telephone Encounter (Signed)
Patient notified that referral has been placed and verbalized understanding. °

## 2017-03-26 NOTE — Telephone Encounter (Signed)
Pt was cory in feb 2018 and was referred to psychiatry for panic attacks. Pt said psychiatry would like her to see cardiologist for heart palps.

## 2017-03-26 NOTE — Telephone Encounter (Signed)
Does patient need to be evaluated here in the office for heart palpitations, or can she be referred by psychiatry's recommendation?

## 2017-03-27 ENCOUNTER — Emergency Department (HOSPITAL_COMMUNITY)
Admission: EM | Admit: 2017-03-27 | Discharge: 2017-03-27 | Disposition: A | Discharge status: Home / Self Care | Payer: BLUE CROSS/BLUE SHIELD | Attending: Emergency Medicine | Admitting: Emergency Medicine

## 2017-03-27 DIAGNOSIS — R002 Palpitations: Secondary | ICD-10-CM

## 2017-03-27 MED ORDER — ACETAMINOPHEN 500 MG PO TABS
1000.0000 mg | ORAL_TABLET | Freq: Once | ORAL | Status: AC
  Administered 2017-03-27: 1000 mg via ORAL
  Filled 2017-03-27: qty 2

## 2017-03-27 NOTE — ED Provider Notes (Signed)
[facility] Provider Note   CSN: [id number] Arrival date & time: 03/26/17  2022    History   Chief Complaint Chief Complaint  Patient presents with  . Palpitations    HPI Joanne Molina is a 37 y.o. female.  37 year old female with a history of anxiety, depression, and migraine headaches presents to the emergency department for palpitations. Patient states that symptoms awoke her from sleep last night. They have been waxing and waning throughout the day. Patient denies any known modifying factors of her symptoms. She characterizes her palpitations as her "heart skipping a beat". She has also felt a pressure-like sensation on her chest. She denies shortness of breath, lightheadedness, syncope or near-syncope, dizziness, nausea, vomiting, lower extremity swelling, or recent fevers. She does report recently starting BuSpar. [name] in the process of tapering up this medication. She does not believe that her symptoms feel similar to her typical anxiety.      Past Medical History:  Diagnosis Date  . Anxiety    panic attack  . Depression   . Migraine     Patient Active Problem List   Diagnosis Date Noted  . Anxiety 02/12/2017  . Gestational hypertension 07/12/2016  . Gestational hypertension w/o significant proteinuria in 3rd trimester 07/12/2016  . Urinary frequency 09/09/2012  . Biliary colic 08/30/2011  . Abdominal pain, chronic, right upper quadrant 08/22/2011  . BLURRED VISION 11/22/2007    Past Surgical History:  Procedure Laterality Date  . CHOLECYSTECTOMY  10/02/11    OB History    Gravida Para Term Preterm AB Living   2 2 2     2    SAB TAB Ectopic Multiple Live Births         0 2       Home Medications    Prior to Admission medications   Medication Sig Start Date End Date Taking? Authorizing Provider  busPIRone (BUSPAR) 15 MG tablet Take 5 mg by mouth 2 (two) times daily. 03/22/17  Yes Historical Provider, MD  FLUoxetine (PROZAC) 40 MG capsule Take  1 capsule (40 mg total) by mouth daily. 07/20/16  Yes Shirline Freesory Nafziger, NP    Family History Family History  Problem Relation Age of Onset  . Diabetes Mother   . Hyperlipidemia Mother   . Heart disease Mother   . Diabetes Father   . Hyperlipidemia Father   . Heart disease Father   . Cancer Maternal Grandmother     colon and breast  . Cancer Paternal Grandmother     colon    Social History Social History  Substance Use Topics  . Smoking status: Never Smoker  . Smokeless tobacco: Never Used  . Alcohol use No     Allergies   Erythromycin   Review of Systems Review of Systems Ten systems reviewed and are negative for acute change, except as noted in the HPI.    Physical Exam Updated Vital Signs BP (!) 135/93   Pulse 85   Temp 97.5 F (36.4 C) (Oral)   Resp 17   LMP 01/26/2017   SpO2 99%   Physical Exam  Constitutional: [name] oriented to person, place, and time. She appears well-developed and well-nourished. No distress.  Nontoxic and in NAD  HENT:  Head: Normocephalic and atraumatic.  Eyes: Conjunctivae and EOM are normal. No scleral icterus.  Neck: Normal range of motion.  Cardiovascular: Normal rate, regular rhythm and intact distal pulses.   Pulmonary/Chest: Effort normal. No respiratory distress. She has no wheezes. She  has no rales.  Respirations even and unlabored. Lungs CTAB.  Musculoskeletal: Normal range of motion.  No BLE edema.  Neurological: [name] alert and oriented to person, place, and time. She exhibits normal muscle tone. Coordination normal.  GCS 15. Patient moving all extremities.  Skin: Skin is warm and dry. No rash noted. [name] not diaphoretic. No erythema. No pallor.  Psychiatric: She has a normal mood and affect. Her behavior is normal.  Nursing note and vitals reviewed.    ED Treatments / Results  Labs (all labs ordered are listed, but only abnormal results are displayed) Labs Reviewed  BASIC METABOLIC PANEL - Abnormal; Notable  for the following:       Result Value   Glucose, Bld 100 (*)    All other components within normal limits  CBC  I-STAT TROPOININ, ED    EKG  EKG Interpretation  Date/Time:  Tuesday March 26 2017 20:27:12 EDT Ventricular Rate:  89 PR Interval:  132 QRS Duration: 84 QT Interval:  342 QTC Calculation: 416 R Axis:   53 Text Interpretation:  Normal sinus rhythm Cannot rule out Anterior infarct , age undetermined Abnormal ECG No old tracing to compare Confirmed by GOLDSTON MD, SCOTT [id number]) on 03/27/2017 2:00:49 AM       Radiology Dg Chest 2 View  Result Date: 03/26/2017 CLINICAL DATA:  Heart palpitations EXAM: CHEST  2 VIEW COMPARISON:  01/04/2017 FINDINGS: The heart size and mediastinal contours are within normal limits. Both lungs are clear. The visualized skeletal structures are unremarkable. IMPRESSION: No active cardiopulmonary disease. Electronically Signed   By: Tollie Eth M.D.   On: 03/26/2017 21:37    Procedures Procedures (including critical care time)  Medications Ordered in ED Medications  acetaminophen (TYLENOL) tablet 1,000 mg (1,000 mg Oral Given 03/27/17 0345)     Initial Impression / Assessment and Plan / ED Course  I have reviewed the triage vital signs and the nursing notes.  Pertinent labs & imaging results that were available during my care of the patient were reviewed by me and considered in my medical decision making (see chart for details).     37 year old female presents to the emergency department for evaluation of palpitations x 1 day. She does have a history of anxiety and was recently started on Buspar. Suspect that symptoms may be secondary to use of new medication.   Patient has a reassuring cardiac workup today. Troponin is 0. EKG is nonischemic and without evidence of dysrhythmia. Chest x-ray without any acute cardiopulmonary abnormalities. Chemistry panel shows normal electrolytes.  I have encouraged the patient to follow-up with her  primary care doctor or a cardiologist for further evaluation of her symptoms. She may benefit from outpatient Holter monitoring. I also recommended that she discuss changes to her Buspar medication given that this may be contributing to her palpitations. Return precautions discussed and provided. Patient discharged in stable condition with no unaddressed concerns.   Final Clinical Impressions(s) / ED Diagnoses   Final diagnoses:  Palpitations    New Prescriptions Discharge Medication List as of 03/27/2017  3:11 AM       Antony Madura, PA-C 03/27/17 0530    Pricilla Loveless, MD 03/30/17 1549

## 2017-03-27 NOTE — ED Notes (Signed)
EDP at bedside  

## 2017-03-27 NOTE — ED Notes (Signed)
Pt reports chest discomfort that she "has had this whole time" but did not mention at triage.

## 2017-03-27 NOTE — Discharge Instructions (Signed)
Your workup today was reassuring. It is possible that your symptoms may be due to recently starting Buspar. We advise follow-up with your primary care doctor to discuss changes to this medication. You may also benefit from having a Holter monitor completed. This can be performed by a cardiologist outside the hospital. Your primary care doctor may also be able to coordinate this. You may return to the emergency department, as needed, for new or concerning symptoms.

## 2017-04-05 ENCOUNTER — Encounter: Payer: Self-pay | Admitting: *Deleted

## 2017-04-18 DIAGNOSIS — F411 Generalized anxiety disorder: Secondary | ICD-10-CM | POA: Diagnosis not present

## 2017-04-21 NOTE — Progress Notes (Signed)
Cardiology Office Note   Date:  04/24/2017   ID:  Joanne Molina, DOB 06-03-1980, MRN [id number]  PCP:  Evette Georges, MD  Cardiologist:   Charlton Haws, MD   Chief Complaint  Patient presents with  . Establish Care      History of Present Illness: Joanne MONCEAUX is a 37 y.o. female who presents for consultation/evaluation of palpitations. Referred by Joanne Frees PA.  Also reviewed notes from ER visit 03/27/17  History of anxiety, depression and migraines. Heart skipping woke her from sleep. Intermittent for hours Pressure in chest No dyspnea. Started on BuSpar and dose has been escalating. R/O CXR NAD telemetry and ECG normal. Labs ok but thyroid not checked.   Has had panic attacks since age 33 . On Prozac and clonopin in past Seeing a psychiatrist   Symptoms improved with changing anti depressant. [name] concerned about her family history of MI in grandfather. And HTN in parents   Past Medical History:  Diagnosis Date  . Anxiety    panic attack  . Depression   . Migraine     Past Surgical History:  Procedure Laterality Date  . CHOLECYSTECTOMY  10/02/11     Current Outpatient Prescriptions  Medication Sig Dispense Refill  . ALPRAZolam (XANAX) 0.5 MG tablet Take 0.5 mg by mouth 2 (two) times daily.  1  . FLUoxetine (PROZAC) 20 MG capsule Take 20 mg by mouth 3 (three) times daily.  0  . pantoprazole (PROTONIX) 40 MG tablet Take 40 mg by mouth daily.  1  . propranolol (INDERAL) 10 MG tablet Take 1 tablet (10 mg total) by mouth as needed (palpitations). 90 tablet 3   No current facility-administered medications for this visit.     Allergies:   Erythromycin    Social History:  The patient  reports that she has never smoked. She has never used smokeless tobacco. She reports that she does not drink alcohol or use drugs.   Family History:  The patient's family history includes Cancer in her maternal grandmother and paternal grandmother; Diabetes in her father and  mother; Heart disease in her father and mother; Hyperlipidemia in her father and mother.    ROS:  Please see the history of present illness.   Otherwise, review of systems are positive for none.   All other systems are reviewed and negative.    PHYSICAL EXAM: VS:  BP 114/80   Pulse 89   Ht  (1.702 m)   Wt 154 lb 12.8 oz (70.2 kg)   SpO2 98%   BMI 24.25 kg/m  , BMI Body mass index is 24.25 kg/m. Affect appropriate Healthy:  appears stated age HEENT: normal Neck supple with no adenopathy JVP normal no bruits no thyromegaly Lungs clear with no wheezing and good diaphragmatic motion Heart:  S1/S2 no murmur, no rub, gallop or click PMI normal Abdomen: benighn, BS positve, no tenderness, no AAA post cholycystectomy no bruit.  No HSM or HJR Distal pulses intact with no bruits No edema Neuro non-focal Skin warm and dry No muscular weakness    EKG:  SR rate 89 poor R wave progression 03/27/17   Recent Labs: 07/16/2016: ALT 44 03/26/2017: BUN 9; Creatinine, Ser 0.96; Hemoglobin 14.9; Platelets 234; Potassium 3.9; Sodium 139    Lipid Panel No results found for: CHOL, TRIG, HDL, CHOLHDL, VLDL, LDLCALC, LDLDIRECT    Wt Readings from Last 3 Encounters:  04/24/17 154 lb 12.8 oz (70.2 kg)  02/12/17 150 lb (  68 kg)  01/04/17 152 lb 5 oz (69.1 kg)      Other studies Reviewed: Additional studies/ records that were reviewed today include: Notes ER including labs, ECG and CXR notes from primary .    ASSESSMENT AND PLAN:  1.  Palpitations benign improved with d/c Buspar PRN inderal called in if worsen will call to arrange event monitor 2. Anxiety / Depression better with bid xanax and prozac f/u psychiatry 3. Migraines improved PRN Imitrex f/u neurology avoid ETOH 4. Chest Pain related to panic attack f/u ETT also suggested calcium score given family history    Current medicines are reviewed at length with the patient today.  The patient does not have concerns regarding  medicines.  The following changes have been made:  no change  Labs/ tests ordered today include: TSH/T4  Event monitor   Orders Placed This Encounter  Procedures  . CT CARDIAC SCORING  . EXERCISE TOLERANCE TEST     Disposition:   FU with cardiology PRN      Signed, Charlton Haws, MD  04/24/2017 8:59 AM    [facility] [address] [place], [place], [place]  [address] Phone: [phone]; Fax: [phone]

## 2017-04-24 ENCOUNTER — Ambulatory Visit (INDEPENDENT_AMBULATORY_CARE_PROVIDER_SITE_OTHER)
Admission: RE | Admit: 2017-04-24 | Discharge: 2017-04-24 | Disposition: A | Discharge status: Home / Self Care | Payer: Self-pay | Source: Ambulatory Visit | Attending: Cardiovascular Disease | Admitting: Cardiovascular Disease

## 2017-04-24 ENCOUNTER — Encounter: Payer: Self-pay | Admitting: Cardiovascular Disease

## 2017-04-24 ENCOUNTER — Ambulatory Visit (INDEPENDENT_AMBULATORY_CARE_PROVIDER_SITE_OTHER): Payer: BLUE CROSS/BLUE SHIELD | Admitting: Cardiovascular Disease

## 2017-04-24 VITALS — BP 114/80 | HR 89 | Ht 67.0 in | Wt 154.8 lb

## 2017-04-24 DIAGNOSIS — R079 Chest pain, unspecified: Secondary | ICD-10-CM

## 2017-04-24 DIAGNOSIS — Z7689 Persons encountering health services in other specified circumstances: Secondary | ICD-10-CM | POA: Diagnosis not present

## 2017-04-24 MED ORDER — PROPRANOLOL HCL 10 MG PO TABS: 10.0000 mg | ORAL_TABLET | ORAL | 3 refills | Status: DC | PRN

## 2017-04-24 NOTE — Patient Instructions (Addendum)
Medication Instructions:  Your physician has recommended you make the following change in your medication:  1-START Inderal 10 mg by mouth as needed for palpitations  Labwork: NONE  Testing/Procedures: Your physician has requested that you have an exercise tolerance test. For further information please visit [email or web address] Please also follow instruction sheet, as given.  Cardiac CT  For Calcium score scanning, (CAT scanning), is a noninvasive, special x-ray that produces cross-sectional images of the body using x-rays and a computer. CT scans help physicians diagnose and treat medical conditions. For some CT exams, a contrast material is used to enhance visibility in the area of the body being studied. CT scans provide greater clarity and reveal more details than regular x-ray exams.  Follow-Up: Your physician wants you to follow-up as needed with Dr. Eden Emms.   If you need a refill on your cardiac medications before your next appointment, please call your pharmacy.

## 2017-04-25 ENCOUNTER — Telehealth: Payer: Self-pay

## 2017-04-25 NOTE — Telephone Encounter (Signed)
-----   Message from Wendall Stade, MD sent at 04/24/2017  2:06 PM EDT ----- Calcium score 0 great see note regarding asymmetric breast tissue f/u OB/ primary ? Mammogram

## 2017-04-25 NOTE — Telephone Encounter (Signed)
Called patient about her Calcium Score and Dr. Fabio Bering recommendations. Patient verbalized understanding. Will forward test to PCP and Dr. Mindi Slicker her OB/GYN.

## 2017-05-10 ENCOUNTER — Emergency Department (HOSPITAL_COMMUNITY): Payer: BLUE CROSS/BLUE SHIELD

## 2017-05-10 ENCOUNTER — Encounter (HOSPITAL_COMMUNITY): Payer: Self-pay

## 2017-05-10 ENCOUNTER — Emergency Department (HOSPITAL_COMMUNITY)
Admission: EM | Admit: 2017-05-10 | Discharge: 2017-05-11 | Disposition: A | Discharge status: Home / Self Care | Payer: BLUE CROSS/BLUE SHIELD | Attending: Emergency Medicine | Admitting: Emergency Medicine

## 2017-05-10 DIAGNOSIS — Z79899 Other long term (current) drug therapy: Secondary | ICD-10-CM | POA: Insufficient documentation

## 2017-05-10 DIAGNOSIS — N83201 Unspecified ovarian cyst, right side: Secondary | ICD-10-CM | POA: Insufficient documentation

## 2017-05-10 DIAGNOSIS — R109 Unspecified abdominal pain: Secondary | ICD-10-CM | POA: Diagnosis not present

## 2017-05-10 LAB — URINALYSIS, ROUTINE W REFLEX MICROSCOPIC
Bilirubin Urine: NEGATIVE
Glucose, UA: NEGATIVE mg/dL
Hgb urine dipstick: NEGATIVE
Ketones, ur: 5 mg/dL — AB
Leukocytes, UA: NEGATIVE
Nitrite: NEGATIVE
Protein, ur: NEGATIVE mg/dL
Specific Gravity, Urine: 1.021 (ref 1.005–1.030)
pH: 5 (ref 5.0–8.0)

## 2017-05-10 LAB — COMPREHENSIVE METABOLIC PANEL
ALBUMIN: 3.9 g/dL (ref 3.5–5.0)
ALK PHOS: 47 U/L (ref 38–126)
ALT: 18 U/L (ref 14–54)
ANION GAP: 8 (ref 5–15)
AST: 18 U/L (ref 15–41)
BUN: 10 mg/dL (ref 6–20)
CALCIUM: 8.9 mg/dL (ref 8.9–10.3)
CO2: 23 mmol/L (ref 22–32)
Chloride: 105 mmol/L (ref 101–111)
Creatinine, Ser: 0.92 mg/dL (ref 0.44–1.00)
GFR calc Af Amer: >60 mL/min (ref >60)
GFR calc non Af Amer: >60 mL/min (ref >60)
GLUCOSE: 112 mg/dL — AB (ref 65–99)
POTASSIUM: 3.3 mmol/L — AB (ref 3.5–5.1)
SODIUM: 136 mmol/L (ref 135–145)
Total Bilirubin: 0.5 mg/dL (ref 0.3–1.2)
Total Protein: 6.8 g/dL (ref 6.5–8.1)

## 2017-05-10 LAB — LIPASE, BLOOD: Lipase: 28 U/L (ref 11–51)

## 2017-05-10 LAB — CBC
HEMATOCRIT: 42 % (ref 36.0–46.0)
HEMOGLOBIN: 14.6 g/dL (ref 12.0–15.0)
MCH: 30.9 pg (ref 26.0–34.0)
MCHC: 34.8 g/dL (ref 30.0–36.0)
MCV: 88.8 fL (ref 78.0–100.0)
Platelets: 210 10*3/uL (ref 150–400)
RBC: 4.73 MIL/uL (ref 3.87–5.11)
RDW: 12.4 % (ref 11.5–15.5)
WBC: 9.3 10*3/uL (ref 4.0–10.5)

## 2017-05-10 LAB — PREGNANCY, URINE: Preg Test, Ur: NEGATIVE

## 2017-05-10 MED ORDER — FENTANYL CITRATE (PF) 100 MCG/2ML IJ SOLN
100.0000 ug | Freq: Once | INTRAMUSCULAR | Status: AC
  Administered 2017-05-10: 100 ug via INTRAVENOUS
  Filled 2017-05-10: qty 2

## 2017-05-10 MED ORDER — IOPAMIDOL (ISOVUE-300) INJECTION 61%
INTRAVENOUS | Status: AC
  Administered 2017-05-10: 100 mL
  Filled 2017-05-10: qty 100

## 2017-05-10 MED ORDER — KETOROLAC TROMETHAMINE 30 MG/ML IJ SOLN
30.0000 mg | Freq: Once | INTRAMUSCULAR | Status: AC
  Administered 2017-05-10: 30 mg via INTRAVENOUS
  Filled 2017-05-10: qty 1

## 2017-05-10 NOTE — ED Notes (Signed)
Patient ambulated independently to restroom.  Gait steady and even.   

## 2017-05-10 NOTE — ED Notes (Signed)
Pt to ultrasound

## 2017-05-10 NOTE — ED Provider Notes (Signed)
[facility]EMER[facility]ENCY DEPT Provider Note   CSN: [id number] Arrival date & time: 05/10/17  1534     History   Chief Complaint Chief Complaint  Patient presents with  . Abdominal Pain    HPI ONI DIETZMAN is a 37 y.o. female.  37yo F w/ PMH including anxiety/depression who p/w abdominal pain. Patient states that she has had 2 weeks of intermittent right-sided abdominal pain that became much worse today. The pain is sometimes worse after eating but she notes that she has had a cholecystectomy. No associated vomiting but she has had some dry heaving. No diarrhea, bloody stools, or change in bowel habits. She reports some mild urinary hesitancy but no dysuria or hematuria. No vaginal bleeding or discharge. No fever, cough/cold symptoms, or recent illness. She has tried Tylenol and ibuprofen with minimal to no relief.   The history is provided by the patient.  Abdominal Pain      Past Medical History:  Diagnosis Date  . Anxiety    panic attack  . Depression   . Migraine     Patient Active Problem List   Diagnosis Date Noted  . Anxiety 02/12/2017  . Gestational hypertension 07/12/2016  . Gestational hypertension w/o significant proteinuria in 3rd trimester 07/12/2016  . Urinary frequency 09/09/2012  . Biliary colic 08/30/2011  . Abdominal pain, chronic, right upper quadrant 08/22/2011  . BLURRED VISION 11/22/2007    Past Surgical History:  Procedure Laterality Date  . CHOLECYSTECTOMY  10/02/11    OB History    Gravida Para Term Preterm AB Living   2 2 2     2    SAB TAB Ectopic Multiple Live Births         0 2       Home Medications    Prior to Admission medications   Medication Sig Start Date End Date Taking? Authorizing Provider  ALPRAZolam Prudy Feeler) 0.5 MG tablet Take 0.5 mg by mouth 2 (two) times daily. 04/23/17   [provider]  FLUoxetine (PROZAC) 20 MG capsule Take 20 mg by mouth 3 (three) times daily. 04/23/17   [provider]  pantoprazole  (PROTONIX) 40 MG tablet Take 40 mg by mouth daily. 04/14/17   [provider]  propranolol (INDERAL) 10 MG tablet Take 1 tablet (10 mg total) by mouth as needed (palpitations). 04/24/17   Wendall Stade, MD    Family History Family History  Problem Relation Age of Onset  . Diabetes Mother   . Hyperlipidemia Mother   . Heart disease Mother   . Diabetes Father   . Hyperlipidemia Father   . Heart disease Father   . Cancer Maternal Grandmother        colon and breast  . Cancer Paternal Grandmother        colon    Social History Social History  Substance Use Topics  . Smoking status: Never Smoker  . Smokeless tobacco: Never Used  . Alcohol use No     Allergies   Erythromycin   Review of Systems Review of Systems  Gastrointestinal: Positive for abdominal pain.   All other systems reviewed and are negative except that which was mentioned in HPI  Physical Exam Updated Vital Signs BP 122/84   Pulse 72   Temp 98.3 F (36.8 C) (Oral)   Resp 14   Ht 5\' 7"  (1.702 m)   Wt 150 lb (68 kg)   LMP 04/11/2017   SpO2 97%   BMI 23.49 kg/m   Physical  Exam  Constitutional: [name] oriented to person, place, and time. She appears well-developed and well-nourished. No distress.  HENT:  Head: Normocephalic and atraumatic.  Moist mucous membranes  Eyes: Conjunctivae are normal. Pupils are equal, round, and reactive to light.  Neck: Neck supple.  Cardiovascular: Normal rate, regular rhythm and normal heart sounds.   No murmur heard. Pulmonary/Chest: Effort normal and breath sounds normal.  Abdominal: Soft. Bowel sounds are normal. She exhibits no distension and no mass. There is tenderness in the right upper quadrant, right lower quadrant and periumbilical area. There is no rebound and no guarding.  Musculoskeletal: She exhibits no edema.  Neurological: [name] alert and oriented to person, place, and time.  Fluent speech  Skin: Skin is warm and dry.  Psychiatric: She has  a normal mood and affect. Judgment normal.  Nursing note and vitals reviewed.    ED Treatments / Results  Labs (all labs ordered are listed, but only abnormal results are displayed) Labs Reviewed  COMPREHENSIVE METABOLIC PANEL - Abnormal; Notable for the following:       Result Value   Potassium 3.3 (*)    Glucose, Bld 112 (*)    All other components within normal limits  URINALYSIS, ROUTINE W REFLEX MICROSCOPIC - Abnormal; Notable for the following:    APPearance HAZY (*)    Ketones, ur 5 (*)    All other components within normal limits  LIPASE, BLOOD  CBC  PREGNANCY, URINE    EKG  EKG Interpretation None       Radiology [place] Transvaginal Non-ob  Result Date: 05/11/2017 CLINICAL DATA:  Pelvic pain.  Right ovarian cyst on CT. EXAM: TRANSABDOMINAL AND TRANSVAGINAL ULTRASOUND OF PELVIS DOPPLER ULTRASOUND OF OVARIES TECHNIQUE: Both transabdominal and transvaginal ultrasound examinations of the pelvis were performed. Transabdominal technique was performed for global imaging of the pelvis including uterus, ovaries, adnexal regions, and pelvic cul-de-sac. It was necessary to proceed with endovaginal exam following the transabdominal exam to visualize the endometrium and ovaries. Color and duplex Doppler ultrasound was utilized to evaluate blood flow to the ovaries. COMPARISON:  CT abdomen and pelvis 05/10/2017 FINDINGS: Uterus Measurements: 8.8 x 5 x 6.1 cm. Uterus is anteverted. No fibroids or other mass visualized. Endometrium Thickness: 8 mm.  No focal abnormality visualized. Right ovary Measurements: 5.3 x 4.6 x 4.2 cm. Simple appearing cyst measuring 4.8 x 3.7 x 4.4 cm. Left ovary Measurements: 4.3 x 3.8 x 4 cm. Normal appearance/no adnexal mass. Pulsed Doppler evaluation of both ovaries demonstrates normal low-resistance arterial and venous waveforms. Other findings Small amount of free fluid is demonstrated in the pelvis, likely physiologic. IMPRESSION: 4.8 cm diameter right ovarian  simple appearing cyst. This is likely physiologic. Based on patient's age and size less than 5 cm, no follow-up is indicated. This is almost certainly benign, and no specific imaging follow up is recommended according to the Society of Radiologists in Ultrasound 2010 Consensus Conference Statement (D Lenis NoonLevine et al. Management of Asymptomatic Ovarian and Other Adnexal Cysts Imaged at [place]: Society of Radiologists in Ultrasound Consensus Conference Statement 2010. Radiology 256 (Sept 2010): 943-954.). No evidence of ovarian torsion. Electronically Signed   By: Burman NievesWilliam  Stevens M.D.   On: 05/11/2017 00:06   [place] Pelvis Complete  Result Date: 05/11/2017 CLINICAL DATA:  Pelvic pain.  Right ovarian cyst on CT. EXAM: TRANSABDOMINAL AND TRANSVAGINAL ULTRASOUND OF PELVIS DOPPLER ULTRASOUND OF OVARIES TECHNIQUE: Both transabdominal and transvaginal ultrasound examinations of the pelvis were performed. Transabdominal technique was performed for  global imaging of the pelvis including uterus, ovaries, adnexal regions, and pelvic cul-de-sac. It was necessary to proceed with endovaginal exam following the transabdominal exam to visualize the endometrium and ovaries. Color and duplex Doppler ultrasound was utilized to evaluate blood flow to the ovaries. COMPARISON:  CT abdomen and pelvis 05/10/2017 FINDINGS: Uterus Measurements: 8.8 x 5 x 6.1 cm. Uterus is anteverted. No fibroids or other mass visualized. Endometrium Thickness: 8 mm.  No focal abnormality visualized. Right ovary Measurements: 5.3 x 4.6 x 4.2 cm. Simple appearing cyst measuring 4.8 x 3.7 x 4.4 cm. Left ovary Measurements: 4.3 x 3.8 x 4 cm. Normal appearance/no adnexal mass. Pulsed Doppler evaluation of both ovaries demonstrates normal low-resistance arterial and venous waveforms. Other findings Small amount of free fluid is demonstrated in the pelvis, likely physiologic. IMPRESSION: 4.8 cm diameter right ovarian simple appearing cyst. This is likely physiologic.  Based on patient's age and size less than 5 cm, no follow-up is indicated. This is almost certainly benign, and no specific imaging follow up is recommended according to the Society of Radiologists in Ultrasound 2010 Consensus Conference Statement (D Lenis Noon et al. Management of Asymptomatic Ovarian and Other Adnexal Cysts Imaged at [place]: Society of Radiologists in Ultrasound Consensus Conference Statement 2010. Radiology 256 (Sept 2010): 943-954.). No evidence of ovarian torsion. Electronically Signed   By: Burman Nieves M.D.   On: 05/11/2017 00:06   Ct Abdomen Pelvis W Contrast  Result Date: 05/10/2017 CLINICAL DATA:  Worsening right-sided abdominal pain 2 weeks. Previous cholecystectomy. EXAM: CT ABDOMEN AND PELVIS WITH CONTRAST TECHNIQUE: Multidetector CT imaging of the abdomen and pelvis was performed using the standard protocol following bolus administration of intravenous contrast. CONTRAST:  <See Chart> ISOVUE-300 IOPAMIDOL (ISOVUE-300) INJECTION 61% COMPARISON:  06/28/2015 FINDINGS: Lower chest: Lung bases are within normal. Hepatobiliary: 1.3 cm hypodensity over the central left lobe of the liver unchanged likely a cyst. Subcentimeter hypodensity over the medial segment left lobe of the liver unchanged likely a cyst but too small to characterize. Previous cholecystectomy. Biliary tree is within normal. Pancreas: Within normal. Spleen: Within normal. Adrenals/Urinary Tract: Adrenal glands are normal. Kidneys are normal in size without hydronephrosis or nephrolithiasis. Subcentimeter hypodensity over the mid pole of the right renal cortex unchanged and likely a cyst but too small to characterize. Ureters and bladder are normal. Stomach/Bowel: The stomach and small bowel are within normal. Appendix is normal. Very minimal diverticulosis of the colon. Vascular/Lymphatic: Vascular structures are within normal. No adenopathy. Reproductive: Uterus is within normal. Left ovary is normal. There is a 4.5 cm  simple cystic structure over the right ovary. Tampon over the vagina. Mild amount of free pelvic fluid. Other: None. Musculoskeletal: Within normal. IMPRESSION: 4.5 cm right ovarian cystic structure likely a follicular cyst. Mild free pelvic fluid. Couple small liver hypodensities unchanged likely cysts or hemangiomas. Subcentimeter right renal cortical hypodensity unchanged and too small to characterize, but likely a cyst. Electronically Signed   By: Elberta Fortis M.D.   On: 05/10/2017 21:41   [place] Art/ven Flow Abd Pelv Doppler  Result Date: 05/11/2017 CLINICAL DATA:  Pelvic pain.  Right ovarian cyst on CT. EXAM: TRANSABDOMINAL AND TRANSVAGINAL ULTRASOUND OF PELVIS DOPPLER ULTRASOUND OF OVARIES TECHNIQUE: Both transabdominal and transvaginal ultrasound examinations of the pelvis were performed. Transabdominal technique was performed for global imaging of the pelvis including uterus, ovaries, adnexal regions, and pelvic cul-de-sac. It was necessary to proceed with endovaginal exam following the transabdominal exam to visualize the endometrium and ovaries. Color and duplex  Doppler ultrasound was utilized to evaluate blood flow to the ovaries. COMPARISON:  CT abdomen and pelvis 05/10/2017 FINDINGS: Uterus Measurements: 8.8 x 5 x 6.1 cm. Uterus is anteverted. No fibroids or other mass visualized. Endometrium Thickness: 8 mm.  No focal abnormality visualized. Right ovary Measurements: 5.3 x 4.6 x 4.2 cm. Simple appearing cyst measuring 4.8 x 3.7 x 4.4 cm. Left ovary Measurements: 4.3 x 3.8 x 4 cm. Normal appearance/no adnexal mass. Pulsed Doppler evaluation of both ovaries demonstrates normal low-resistance arterial and venous waveforms. Other findings Small amount of free fluid is demonstrated in the pelvis, likely physiologic. IMPRESSION: 4.8 cm diameter right ovarian simple appearing cyst. This is likely physiologic. Based on patient's age and size less than 5 cm, no follow-up is indicated. This is almost certainly  benign, and no specific imaging follow up is recommended according to the Society of Radiologists in Ultrasound 2010 Consensus Conference Statement (D Lenis Noon et al. Management of Asymptomatic Ovarian and Other Adnexal Cysts Imaged at [place]: Society of Radiologists in Ultrasound Consensus Conference Statement 2010. Radiology 256 (Sept 2010): 943-954.). No evidence of ovarian torsion. Electronically Signed   By: Burman Nieves M.D.   On: 05/11/2017 00:06    Procedures Procedures (including critical care time)  Medications Ordered in ED Medications  fentaNYL (SUBLIMAZE) injection 100 mcg (100 mcg Intravenous Given 05/10/17 2052)  iopamidol (ISOVUE-300) 61 % injection (100 mLs  Contrast Given 05/10/17 2105)  ketorolac (TORADOL) 30 MG/ML injection 30 mg (30 mg Intravenous Given 05/10/17 2350)     Initial Impression / Assessment and Plan / ED Course  I have reviewed the triage vital signs and the nursing notes.  Pertinent labs & imaging results that were available during my care of the patient were reviewed by me and considered in my medical decision making (see chart for details).    PT w/ 2 weeks intermittent right-sided abdominal pain that became severe today. On exam she was uncomfortable but nontoxic with normal vital signs. She had tenderness in right lower quadrant, right upper quadrant, and peri Umbilical abdomen. AB work unremarkable including normal LFTs and lipase, negative UPT, UA without signs of infection. I discussed with her differential including bowel problems such as diverticulitis, less likely biliary problem given her cholecystectomy, or gynecologic pathology such as ovarian cyst or torsion. Because of her Donald tenderness in several areas, decided to obtain CT of abdomen and pelvis. CT showed a 4.5 cm right ovarian cyst with some free fluid in the pelvis. Obtained ultrasound to evaluate blood flow.  Ultrasound shows simple appearing cyst with normal blood flow. Discussed follow-up  with OB/GYN as well as supportive care instructions. Extensively reviewed return precautions including sudden worsening of symptoms. Patient voiced understanding and was discharged in satisfactory condition.  Final Clinical Impressions(s) / ED Diagnoses   Final diagnoses:  None    New Prescriptions New Prescriptions   No medications on file     Yasmyn Bellisario, Ambrose [place], MD 05/11/17 [phone]

## 2017-05-10 NOTE — ED Triage Notes (Signed)
[facility] with RLQ abd pain that is dull in nature X2 weeks. Pt began having worsening abd pain today. She was given 100mcg of Fentanyl pta. 140/90, HR 120.

## 2017-06-24 DIAGNOSIS — J0100 Acute maxillary sinusitis, unspecified: Secondary | ICD-10-CM | POA: Diagnosis not present

## 2017-07-11 DIAGNOSIS — F411 Generalized anxiety disorder: Secondary | ICD-10-CM | POA: Diagnosis not present

## 2017-07-25 DIAGNOSIS — F411 Generalized anxiety disorder: Secondary | ICD-10-CM | POA: Diagnosis not present

## 2017-08-27 ENCOUNTER — Encounter: Payer: Self-pay | Admitting: Cardiovascular Disease

## 2017-09-20 ENCOUNTER — Encounter: Payer: Self-pay | Admitting: Family Medicine

## 2017-10-22 NOTE — Progress Notes (Addendum)
CARDIOLOGY OFFICE NOTE  Date:  10/23/2017    Joanne BentonMary K Pelaez Date of Birth: 31-May-1980 Medical Record #[id number]  PCP:  Roderick Peeodd, Jeffrey A, MD  Cardiologist:  Eden EmmsNishan  Chief Complaint  Patient presents with  . Follow-up    Seen for Dr. Eden EmmsNishan    History of Present Illness: Joanne Molina is a 37 y.o. female who presents today for a 6 month check. Seen for Dr. Eden EmmsNishan.   She has a history of palpitations, anxiety, depression, migraines and panic attacks.   Referred here back in April for palpitations. FH + for CAD & HTN.  Her palpitations seemed to improve with change in antidepressants. PRN Inderal given. Calcium scoring was zero. To consider event monitor if failed to improve.   Comes in today. Here with her son. She notes palpitations since January - at night - thought she was dying - HR up to 150 -160 with elevated BP - got to a counselor and has been treated for panic attacks. Now notes BP is up. Still having palpitations at night - will wake her up. Says she is still not under control in regards to palpitations. Now on Zoloft. Now taking Inderal every night. This has helped. Worried about her FH and is this causing symptoms. "Not sure what is coming first" - the anxiety or the palpitations. Her cousin died at 8142 from a MI just a few months ago. She does probably use too much salt. No real exercise. Is not aware of the asymmetric breast tissue noted on CT - she did know her score was 0. [name] currently taking 20 mg of Inderal at night, with the Zoloft and the Xanax - her spells of palpitations has improved somewhat.   Past Medical History:  Diagnosis Date  . Anxiety    panic attack  . Depression   . Migraine     Past Surgical History:  Procedure Laterality Date  . CHOLECYSTECTOMY  10/02/11     Medications: Current Meds  Medication Sig  . ALPRAZolam (XANAX PO) Take 2 mg by mouth daily.  . pantoprazole (PROTONIX) 40 MG tablet Take 40 mg by mouth daily.  . propranolol  (INDERAL) 10 MG tablet Take 10 mg by mouth 2 (two) times daily.  . Sertraline HCl (ZOLOFT PO) Take 150 mg by mouth daily.  . [DISCONTINUED] ALPRAZolam (XANAX) 0.5 MG tablet Take 0.5 mg by mouth 2 (two) times daily.  . [DISCONTINUED] propranolol (INDERAL) 10 MG tablet Take 1 tablet (10 mg total) by mouth as needed (palpitations).     Allergies: Allergies  Allergen Reactions  . Erythromycin Nausea Only and Other (See Comments)    REACTION: Upset GI    Social History: The patient  reports that she has never smoked. She has never used smokeless tobacco. She reports that she does not drink alcohol or use drugs.   Family History: The patient's family history includes Cancer in her maternal grandmother and paternal grandmother; Diabetes in her father and mother; Heart disease in her father and mother; Hyperlipidemia in her father and mother.   Review of Systems: Please see the history of present illness.   Otherwise, the review of systems is positive for none.   All other systems are reviewed and negative.   Physical Exam: VS:  BP (!) 120/100   Pulse 88   Ht 5\' 7"  (1.702 m)   Wt 159 lb (72.1 kg)   BMI 24.90 kg/m  .  BMI Body mass index is  24.9 kg/m.  Wt Readings from Last 3 Encounters:  10/23/17 159 lb (72.1 kg)  05/10/17 150 lb (68 kg)  04/24/17 154 lb 12.8 oz (70.2 kg)    General: Pleasant. Well developed, well nourished and in no acute distress. [name] overweight.   HEENT: Normal.  Neck: Supple, no JVD, carotid bruits, or masses noted.  Cardiac: Regular rate and rhythm. No murmurs, rubs, or gallops. No edema.  Respiratory:  Lungs are clear to auscultation bilaterally with normal work of breathing.  GI: Soft and nontender.  MS: No deformity or atrophy. Gait and ROM intact.  Skin: Warm and dry. Color is normal.  Neuro:  Strength and sensation are intact and no gross focal deficits noted.  Psych: Alert, appropriate and with normal affect.   LABORATORY DATA:  EKG:  EKG is  not ordered today.   Lab Results  Component Value Date   WBC 9.3 05/10/2017   HGB 14.6 05/10/2017   HCT 42.0 05/10/2017   PLT 210 05/10/2017   GLUCOSE 112 (H) 05/10/2017   ALT 18 05/10/2017   AST 18 05/10/2017   NA 136 05/10/2017   K 3.3 (L) 05/10/2017   CL 105 05/10/2017   CREATININE 0.92 05/10/2017   BUN 10 05/10/2017   CO2 23 05/10/2017       BNP (last 3 results) No results for input(s): BNP in the last 8760 hours.  ProBNP (last 3 results) No results for input(s): PROBNP in the last 8760 hours.   Other Studies Reviewed Today:  CT Cardiac Scoring 03/2017 IMPRESSION: Coronary calcium score of 0. See radiology over-read regarding asymmetric breast tissue  Asymmetric breast tissue, left side greater than right. Patient would benefit from clinical examination of this area and consider further characterization with ultrasound and/or mammography.  Charlton Haws   Electronically Signed   By: Charlton Haws M.D.   On: 04/24/2017 13:48  Assessment/Plan:  1. Palpitations - persistent - now taking Inderal every night - will arrange for event monitor to capture these spells - no change in medicines yet but would consider changing to Toprol on follow up. Lab today. Not using much caffeine.   2. Anxiety/depression - seeing psyche  3. Elevated BP - she will monitor at home. Needs to cut back on salt. Will get a BP cuff.    4. Abnormal finding on CT - she has been made aware - she will discuss with OBGYN.   Current medicines are reviewed with the patient today.  The patient does not have concerns regarding medicines other than what has been noted above.  The following changes have been made:  See above.  Labs/ tests ordered today include:    Orders Placed This Encounter  Procedures  . Basic metabolic panel  . CBC  . Hepatic function panel  . Lipid panel  . TSH  . CARDIAC EVENT MONITOR     Disposition:   FU with me after her event monitor to recheck BP.    Patient is agreeable to this plan and will call if any problems develop in the interim.   SignedNorma Fredrickson, NP  10/23/2017 9:05 AM  [facility] [address] [place], [place]  [address] Phone: [phone] Fax: [phone]

## 2017-10-23 ENCOUNTER — Ambulatory Visit (INDEPENDENT_AMBULATORY_CARE_PROVIDER_SITE_OTHER): Payer: 59 | Admitting: Nurse Practitioner

## 2017-10-23 ENCOUNTER — Encounter: Payer: Self-pay | Admitting: Nurse Practitioner

## 2017-10-23 VITALS — BP 120/100 | HR 88 | Ht 67.0 in | Wt 159.0 lb

## 2017-10-23 DIAGNOSIS — R002 Palpitations: Secondary | ICD-10-CM

## 2017-10-23 DIAGNOSIS — I10 Essential (primary) hypertension: Secondary | ICD-10-CM

## 2017-10-23 LAB — BASIC METABOLIC PANEL
BUN/Creatinine Ratio: 18 (ref 9–23)
BUN: 17 mg/dL (ref 6–20)
CO2: 22 mmol/L (ref 20–29)
Calcium: 9 mg/dL (ref 8.7–10.2)
Chloride: 105 mmol/L (ref 96–106)
Creatinine, Ser: 0.96 mg/dL (ref 0.57–1.00)
GFR calc Af Amer: 87 mL/min/{1.73_m2} (ref >59)
GFR calc non Af Amer: 76 mL/min/{1.73_m2} (ref >59)
Glucose: 97 mg/dL (ref 65–99)
Potassium: 4.2 mmol/L (ref 3.5–5.2)
Sodium: 141 mmol/L (ref 134–144)

## 2017-10-23 LAB — CBC
Hematocrit: 40.1 % (ref 34.0–46.6)
Hemoglobin: 14 g/dL (ref 11.1–15.9)
MCH: 31.3 pg (ref 26.6–33.0)
MCHC: 34.9 g/dL (ref 31.5–35.7)
MCV: 90 fL (ref 79–97)
Platelets: 276 10*3/uL (ref 150–379)
RBC: 4.48 x10E6/uL (ref 3.77–5.28)
RDW: 13.2 % (ref 12.3–15.4)
WBC: 8.8 10*3/uL (ref 3.4–10.8)

## 2017-10-23 LAB — LIPID PANEL
Chol/HDL Ratio: 3.7 ratio (ref 0.0–4.4)
Cholesterol, Total: 180 mg/dL (ref 100–199)
HDL: 49 mg/dL (ref >39)
LDL Calculated: 88 mg/dL (ref 0–99)
Triglycerides: 217 mg/dL — ABNORMAL HIGH (ref 0–149)
VLDL Cholesterol Cal: 43 mg/dL — ABNORMAL HIGH (ref 5–40)

## 2017-10-23 LAB — HEPATIC FUNCTION PANEL
ALT: 23 IU/L (ref 0–32)
AST: 18 IU/L (ref 0–40)
Albumin: 4.3 g/dL (ref 3.5–5.5)
Alkaline Phosphatase: 82 IU/L (ref 39–117)
Bilirubin Total: <0.2 mg/dL (ref 0.0–1.2)
Bilirubin, Direct: 0.07 mg/dL (ref 0.00–0.40)
Total Protein: 6.7 g/dL (ref 6.0–8.5)

## 2017-10-23 LAB — TSH: TSH: 2.72 u[IU]/mL (ref 0.450–4.500)

## 2017-10-23 NOTE — Patient Instructions (Signed)
We will be checking the following labs today - BMET, CBC, HPF, Lipids and TSH   Medication Instructions:    Continue with your current medicines for now    Testing/Procedures To Be Arranged:  Event montor  Follow-Up:   See me 5 weeks after the event monitor    Other Special Instructions:   Omron BP cuff  BP diary  Restrict salt  Talk with GYN about CT showing asymmetric breast tissue - ? Need for mammography.     If you need a refill on your cardiac medications before your next appointment, please call your pharmacy.   Call the [facility] office at [phone] if you have any questions, problems or concerns.

## 2017-11-05 ENCOUNTER — Ambulatory Visit (INDEPENDENT_AMBULATORY_CARE_PROVIDER_SITE_OTHER): Payer: Self-pay

## 2017-11-05 DIAGNOSIS — I10 Essential (primary) hypertension: Secondary | ICD-10-CM

## 2017-11-05 DIAGNOSIS — R002 Palpitations: Secondary | ICD-10-CM

## 2017-11-05 IMAGING — DX DG CHEST 2V
2 series · 2 of 2 positions shown · non-contrast
Comparison: 10/17/2015

CLINICAL DATA: Cough and sickness for 3 weeks.  Fevers

EXAM:
CHEST  2 VIEW

[chest pa]
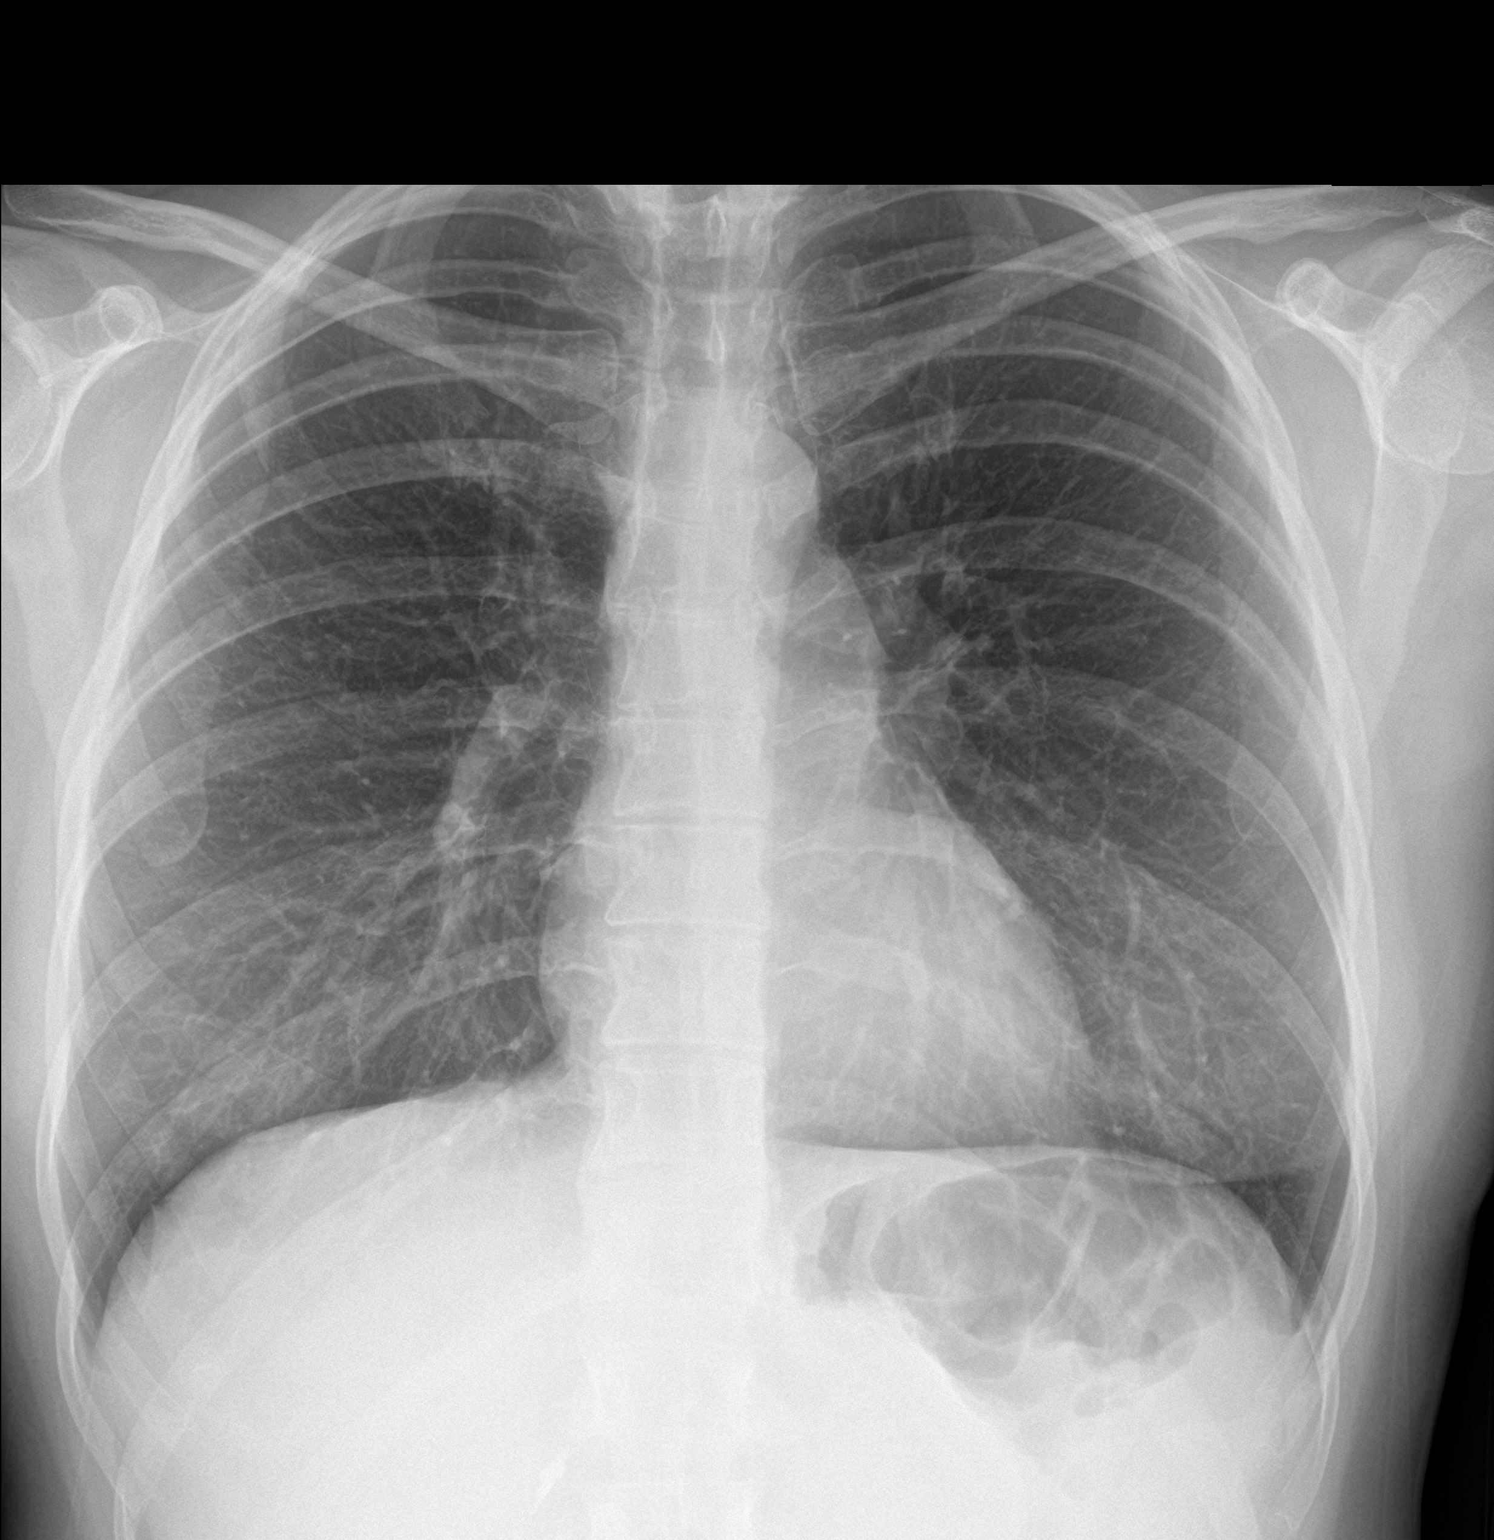

[chest lat]
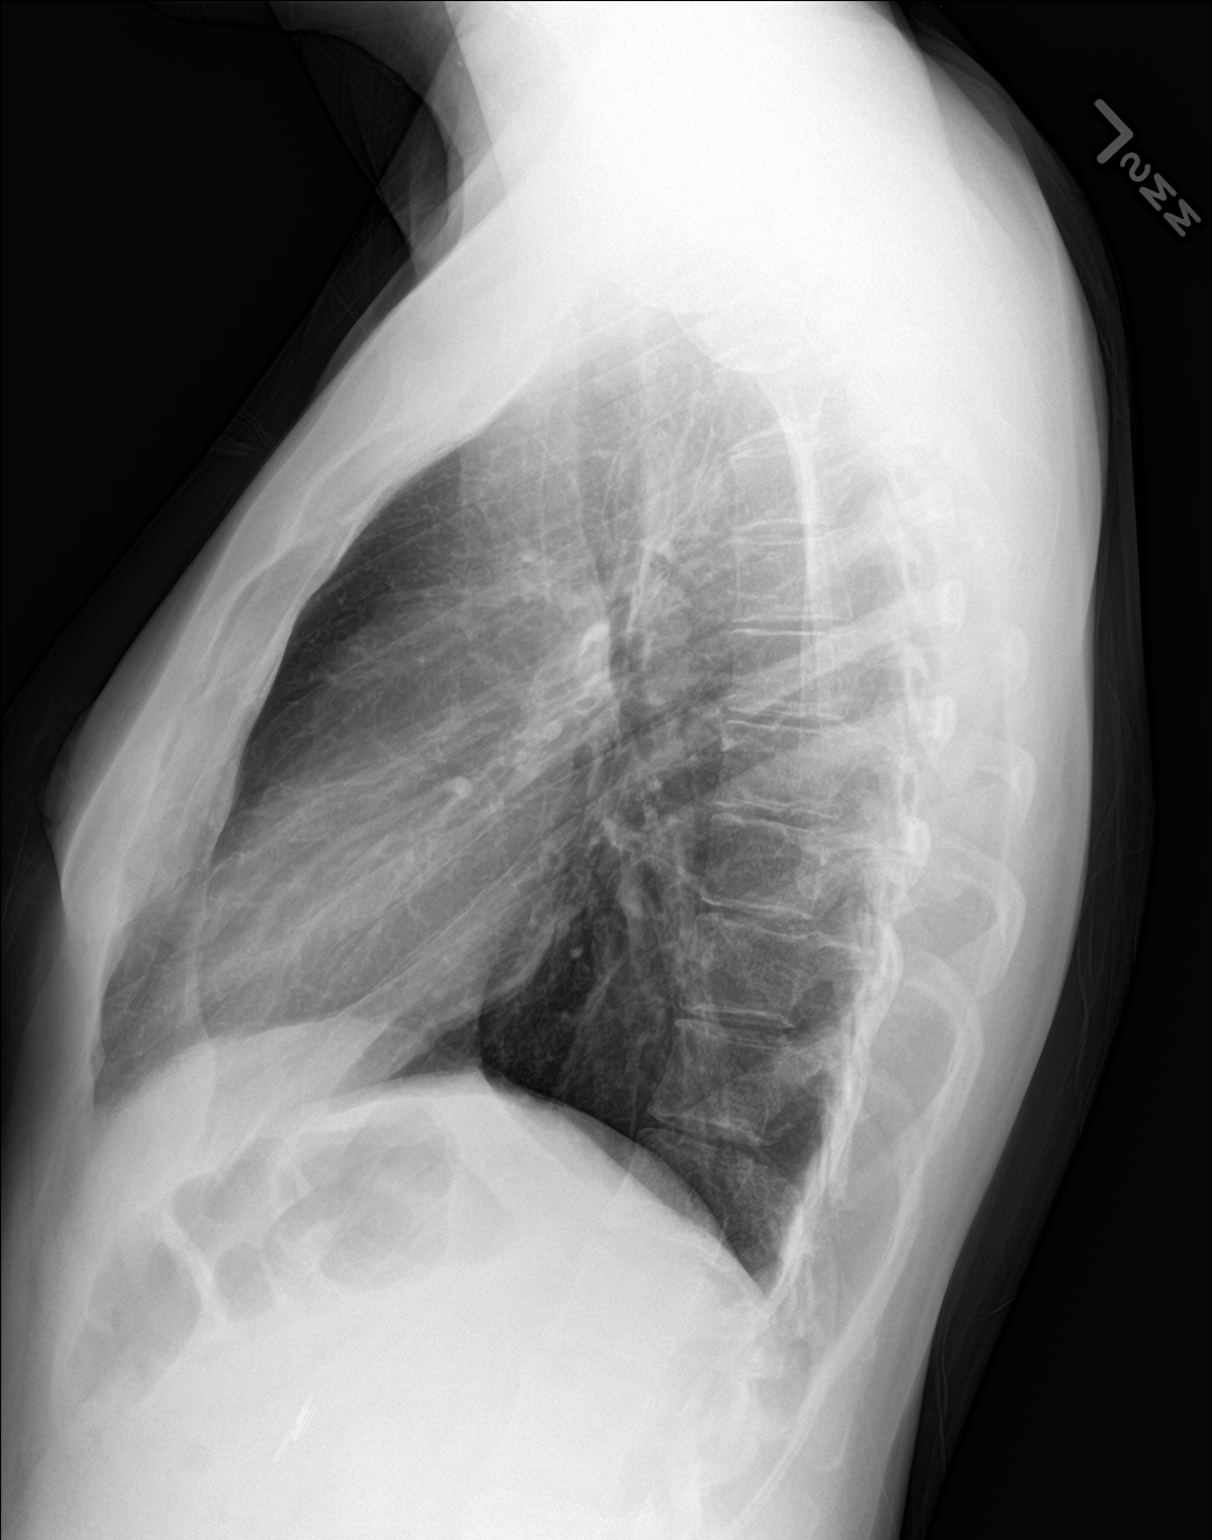

[2 of 2 positions shown; findings below may reference images not displayed]

FINDINGS: The heart size and mediastinal contours are within normal limits.
Both lungs are clear. The visualized skeletal structures are
unremarkable.
IMPRESSION: No active cardiopulmonary disease.

## 2017-11-06 ENCOUNTER — Encounter (INDEPENDENT_AMBULATORY_CARE_PROVIDER_SITE_OTHER): Payer: Self-pay

## 2017-11-13 ENCOUNTER — Telehealth: Payer: Self-pay | Admitting: Nurse Practitioner

## 2017-11-13 NOTE — Telephone Encounter (Signed)
Joanne Molina is calling because she had to get the sensitive leads , because she was breaking out and she will not be able to wear the monitor until tomorrow when they come in . Please call if you have any questions

## 2017-12-16 ENCOUNTER — Ambulatory Visit: Payer: Self-pay | Admitting: Nurse Practitioner

## 2017-12-16 NOTE — Progress Notes (Deleted)
CARDIOLOGY OFFICE NOTE  Date:  12/16/2017    Joanne BentonMary K Deland Date of Birth: 1980/05/14 Medical Record #[id number]  PCP:  Roderick Peeodd, Jeffrey A, MD  Cardiologist:  Townsend RogerGerhardt & Nishan    No chief complaint on file.   History of Present Illness: Joanne Molina is a 37 y.o. female who presents today for a follow up visit. This is a 2 month check. Seen for Dr. Eden EmmsNishan.   She has a history of palpitations, anxiety, depression, migraines and panic attacks.   Referred here back in April for palpitations. FH + for CAD & HTN.  Her palpitations seemed to improve with change in antidepressants. PRN Inderal given. Calcium scoring was zero. To consider event monitor if failed to improve.   I then saw her in October. More palpitations and stress/anxiety. Informed her of asymmetric breast tissue noted on her CT and that her calcium score was 0.   Comes in today. Here with her son.   Past Medical History:  Diagnosis Date  . Anxiety    panic attack  . Depression   . Migraine     Past Surgical History:  Procedure Laterality Date  . CHOLECYSTECTOMY  10/02/11     Medications: No outpatient medications have been marked as taking for the 12/16/17 encounter (Appointment) with Rosalio MacadamiaGerhardt, Akram Kissick C, NP.     Allergies: Allergies  Allergen Reactions  . Erythromycin Nausea Only and Other (See Comments)    REACTION: Upset GI    Social History: The patient  reports that  has never smoked. she has never used smokeless tobacco. She reports that she does not drink alcohol or use drugs.   Family History: The patient's family history includes Cancer in her maternal grandmother and paternal grandmother; Diabetes in her father and mother; Heart disease in her father and mother; Hyperlipidemia in her father and mother.   Review of Systems: Please see the history of present illness.   Otherwise, the review of systems is positive for none.   All other systems are reviewed and negative.   Physical  Exam: VS:  There were no vitals taken for this visit. Marland Kitchen.  BMI There is no height or weight on file to calculate BMI.  Wt Readings from Last 3 Encounters:  10/23/17 159 lb (72.1 kg)  05/10/17 150 lb (68 kg)  04/24/17 154 lb 12.8 oz (70.2 kg)    General: Pleasant. Well developed, well nourished and in no acute distress.   HEENT: Normal.  Neck: Supple, no JVD, carotid bruits, or masses noted.  Cardiac: Regular rate and rhythm. No murmurs, rubs, or gallops. No edema.  Respiratory:  Lungs are clear to auscultation bilaterally with normal work of breathing.  GI: Soft and nontender.  MS: No deformity or atrophy. Gait and ROM intact.  Skin: Warm and dry. Color is normal.  Neuro:  Strength and sensation are intact and no gross focal deficits noted.  Psych: Alert, appropriate and with normal affect.   LABORATORY DATA:  EKG:  EKG is not ordered today.  Lab Results  Component Value Date   WBC 8.8 10/23/2017   HGB 14.0 10/23/2017   HCT 40.1 10/23/2017   PLT 276 10/23/2017   GLUCOSE 97 10/23/2017   CHOL 180 10/23/2017   TRIG 217 (H) 10/23/2017   HDL 49 10/23/2017   LDLCALC 88 10/23/2017   ALT 23 10/23/2017   AST 18 10/23/2017   NA 141 10/23/2017   K 4.2 10/23/2017   CL 105 10/23/2017  CREATININE 0.96 10/23/2017   BUN 17 10/23/2017   CO2 22 10/23/2017   TSH 2.720 10/23/2017     BNP (last 3 results) No results for input(s): BNP in the last 8760 hours.  ProBNP (last 3 results) No results for input(s): PROBNP in the last 8760 hours.   Other Studies Reviewed Today:  Event Monitor Study Highlights   NSR  No significant arrhythmias All symptoms including dyspnea, palpitations, fatigue showed only NSR    CT Cardiac Scoring 03/2017 IMPRESSION: Coronary calcium score of 0. See radiology over-read regarding asymmetric breast tissue  Asymmetric breast tissue, left side greater than right. Patient would benefit from clinical examination of this area and consider further  characterization with ultrasound and/or mammography.  Charlton HawsPeter Nishan   Electronically Signed By: Charlton HawsPeter Nishan M.D. On: 04/24/2017 13:48  Assessment/Plan:  1. Palpitations - persistent - now taking Inderal every night - will arrange for event monitor to capture these spells - no change in medicines yet but would consider changing to Toprol on follow up. Lab today. Not using much caffeine.   2. Anxiety/depression - seeing psyche  3. Elevated BP - she will monitor at home. Needs to cut back on salt. Will get a BP cuff.    4. Abnormal finding on CT - she has been made aware - she will discuss with OBGYN.   Current medicines are reviewed with the patient today.  The patient does not have concerns regarding medicines other than what has been noted above.  The following changes have been made:  See above.  Labs/ tests ordered today include:   No orders of the defined types were placed in this encounter.    Disposition:   FU with *** in {gen number [id number]} {Days to years:10300}.   Patient is agreeable to this plan and will call if any problems develop in the interim.   SignedNorma Fredrickson: Johannes Everage, NP  12/16/2017 7:27 AM  [facility] [address] [place], [place]  [address] Phone: [phone] Fax: [phone]

## 2017-12-19 ENCOUNTER — Encounter: Payer: Self-pay | Admitting: Nurse Practitioner

## 2018-02-12 DIAGNOSIS — F411 Generalized anxiety disorder: Secondary | ICD-10-CM | POA: Diagnosis not present

## 2018-02-23 IMAGING — CT CT HEART SCORING
2 series · 16 of 20 positions shown, 18 images · non-contrast
Comparison: Chest radiograph 03/26/2017 and CT the abdomen
06/28/2015

CLINICAL DATA: Risk stratification

EXAM:
Coronary Calcium Score
TECHNIQUE: The patient was scanned on a Siemens Somatom 64 slice scanner. Axial
non-contrast 3 mm slices were carried out through the heart. The
data set was analyzed on a dedicated work station and scored using
the Agatson method.

[Series 2: casc 3.0 i36f 2 bestdiast 69 % · axial · 0.33mm/px · z∈[-226,-127]mm · 8 of 43 slices shown, 10 images]
[im 5/43  vessel]
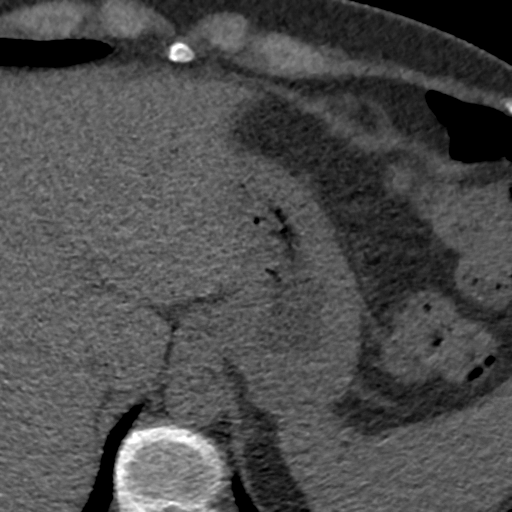
[im 5/43  lung]
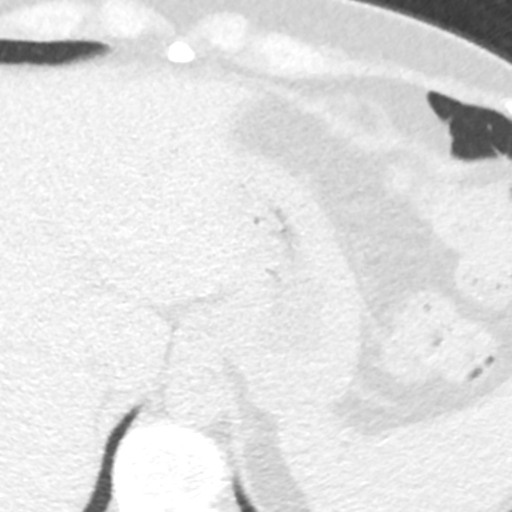
[im 10/43  vessel]
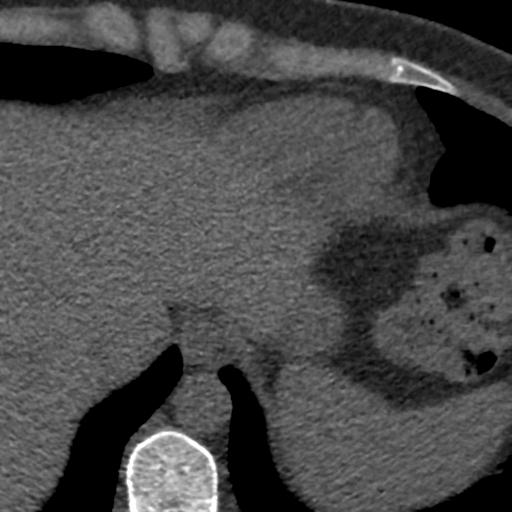
[im 15/43  vessel]
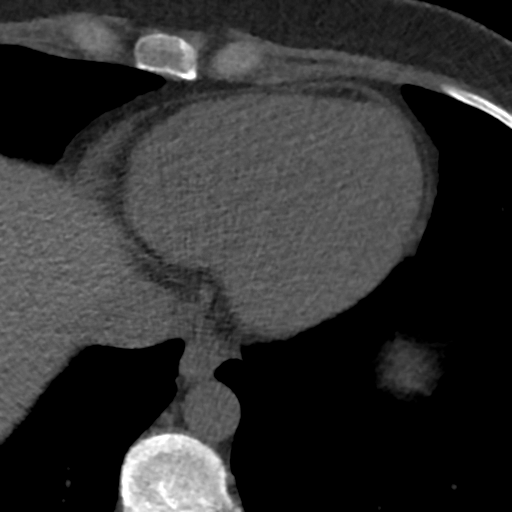
[im 19/43  vessel]
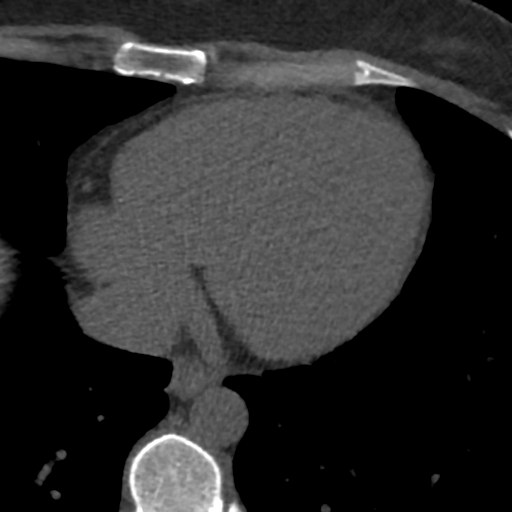
[im 24/43  vessel]
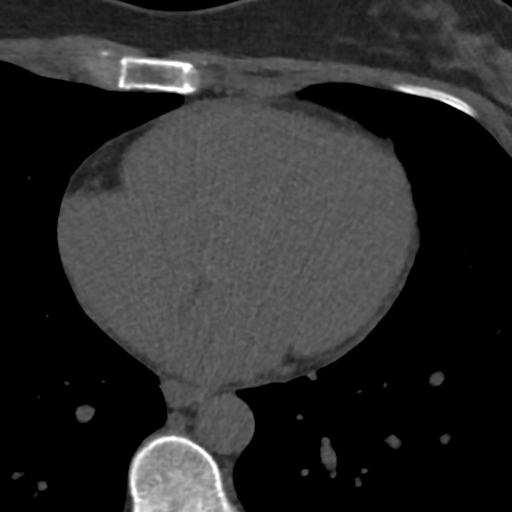
[im 24/43  lung]
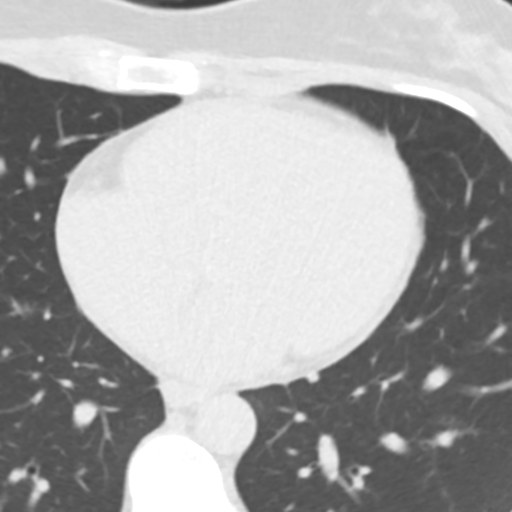
[im 29/43  vessel]
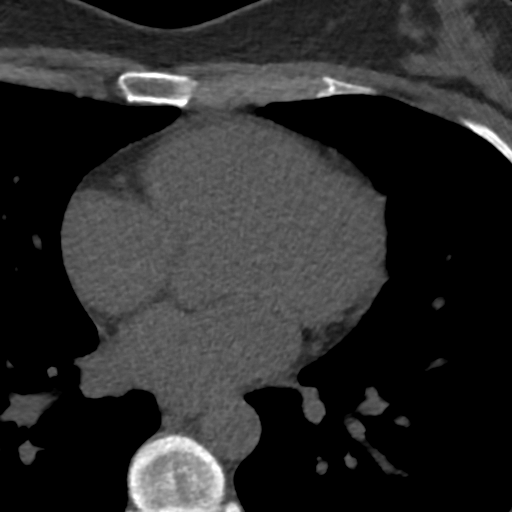
[im 33/43  vessel]
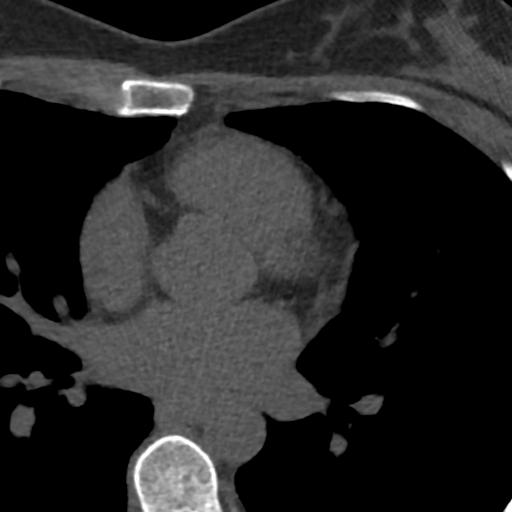
[im 38/43  vessel]
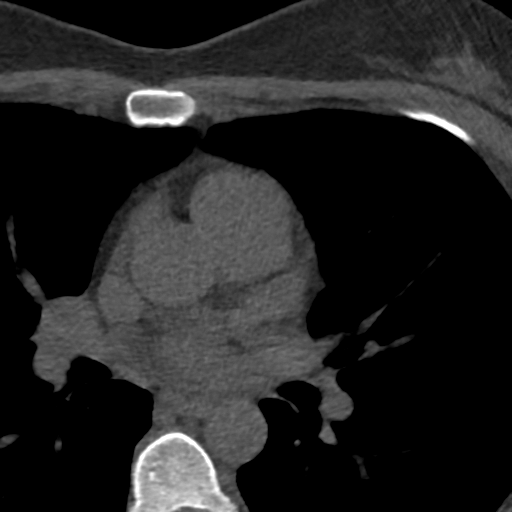

[Series 4: lung st 69 % · axial · 0.68mm/px · z∈[-226,-127]mm · 8 of 43 slices shown]
[im 5/43  lung]
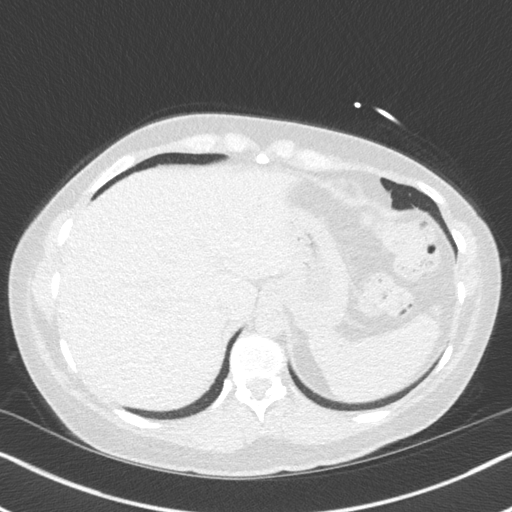
[im 10/43  lung]
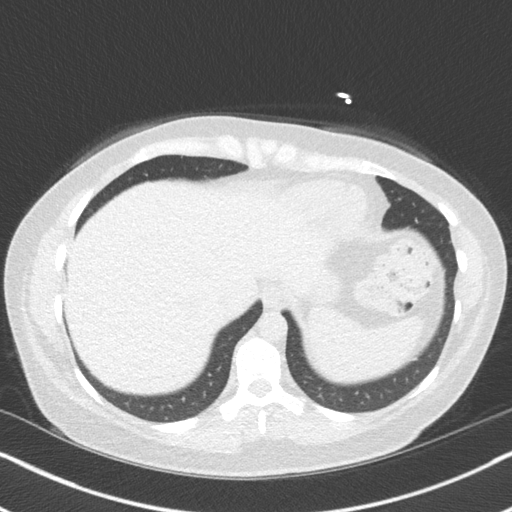
[im 15/43  lung]
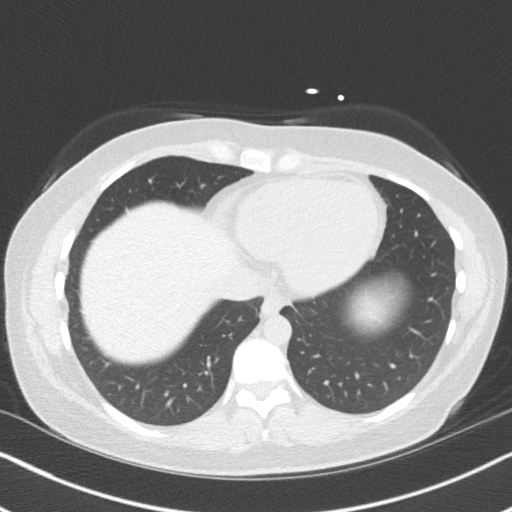
[im 19/43  lung]
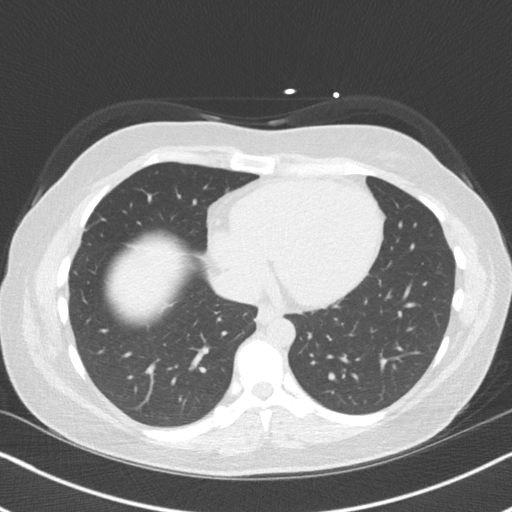
[im 24/43  lung]
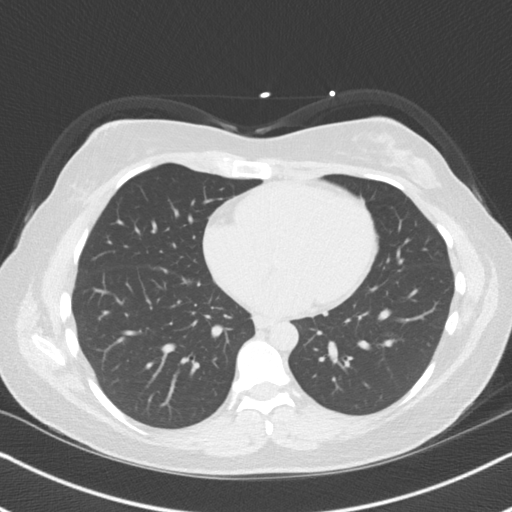
[im 29/43  lung]
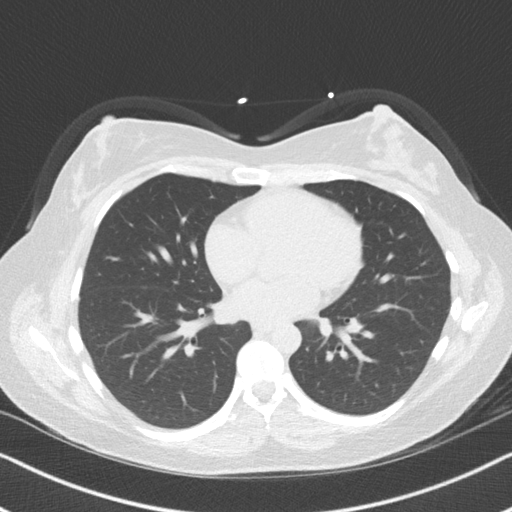
[im 33/43  lung]
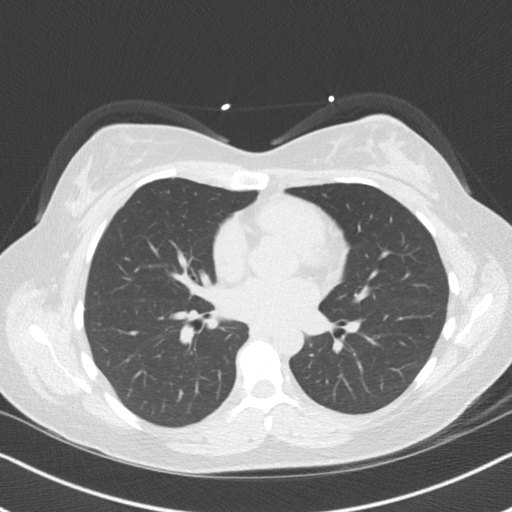
[im 38/43  lung]
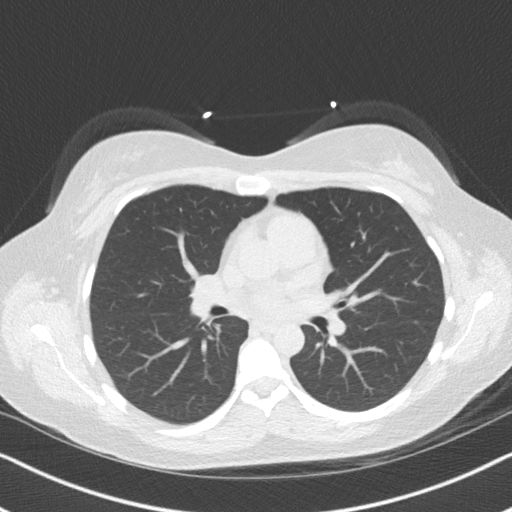

[16 of 20 positions shown; findings below may reference images not displayed]

FINDINGS: Non-cardiac: See separate report from [REDACTED].
Regarding breast tissue

Ascending Aorta:

Pericardium: Normal

Coronary arteries:  No calcium detected
IMPRESSION: Coronary calcium score of 0. See radiology over-read regarding
asymmetric breast tissue

[REDACTED]AL DATA:  The following report is an over-read performed by
This over-read does not include interpretation of cardiac or
coronary anatomy or pathology. The coronary calcium score
interpretation by the cardiologist is attached.
FINDINGS: The visualized portions of the lung fields are clear. No pleural
fluid seen. Visualized portions of the upper abdomen are
unremarkable. No aggressive appearing bone lesions identified.
Asymmetric breast tissue, left side greater than right. This is best
appreciated on sequence 4, image 16.
IMPRESSION: Asymmetric breast tissue, left side greater than right. Patient
would benefit from clinical examination of this area and consider
further characterization with ultrasound and/or mammography.

## 2018-03-08 DIAGNOSIS — J0100 Acute maxillary sinusitis, unspecified: Secondary | ICD-10-CM | POA: Diagnosis not present

## 2018-05-17 DIAGNOSIS — N3090 Cystitis, unspecified without hematuria: Secondary | ICD-10-CM | POA: Diagnosis not present

## 2018-05-17 DIAGNOSIS — N3000 Acute cystitis without hematuria: Secondary | ICD-10-CM | POA: Diagnosis not present

## 2018-07-10 DIAGNOSIS — N3091 Cystitis, unspecified with hematuria: Secondary | ICD-10-CM | POA: Diagnosis not present

## 2018-07-10 DIAGNOSIS — N3000 Acute cystitis without hematuria: Secondary | ICD-10-CM | POA: Diagnosis not present

## 2018-08-01 DIAGNOSIS — F411 Generalized anxiety disorder: Secondary | ICD-10-CM | POA: Diagnosis not present

## 2018-08-07 ENCOUNTER — Ambulatory Visit (INDEPENDENT_AMBULATORY_CARE_PROVIDER_SITE_OTHER): Payer: BLUE CROSS/BLUE SHIELD | Admitting: Adult Health

## 2018-08-07 ENCOUNTER — Encounter: Payer: Self-pay | Admitting: Adult Health

## 2018-08-07 VITALS — BP 120/84 | Temp 98.4°F | Wt 167.0 lb

## 2018-08-07 DIAGNOSIS — F419 Anxiety disorder, unspecified: Secondary | ICD-10-CM | POA: Diagnosis not present

## 2018-08-07 DIAGNOSIS — G4700 Insomnia, unspecified: Secondary | ICD-10-CM | POA: Diagnosis not present

## 2018-08-07 DIAGNOSIS — Z0000 Encounter for general adult medical examination without abnormal findings: Secondary | ICD-10-CM

## 2018-08-07 LAB — HEPATIC FUNCTION PANEL
ALBUMIN: 4.6 g/dL (ref 3.5–5.2)
ALK PHOS: 57 U/L (ref 39–117)
ALT: 16 U/L (ref 0–35)
AST: 15 U/L (ref 0–37)
Bilirubin, Direct: 0.1 mg/dL (ref 0.0–0.3)
TOTAL PROTEIN: 7.4 g/dL (ref 6.0–8.3)
Total Bilirubin: 0.5 mg/dL (ref 0.2–1.2)

## 2018-08-07 LAB — BASIC METABOLIC PANEL
BUN: 16 mg/dL (ref 6–23)
CALCIUM: 10.2 mg/dL (ref 8.4–10.5)
CHLORIDE: 102 meq/L (ref 96–112)
CO2: 31 meq/L (ref 19–32)
Creatinine, Ser: 1 mg/dL (ref 0.40–1.20)
GFR: 65.79 mL/min (ref >60.00)
GLUCOSE: 84 mg/dL (ref 70–99)
Potassium: 4.5 mEq/L (ref 3.5–5.1)
SODIUM: 139 meq/L (ref 135–145)

## 2018-08-07 LAB — CBC WITH DIFFERENTIAL/PLATELET
BASOS ABS: 0.1 10*3/uL (ref 0.0–0.1)
Basophils Relative: 0.7 % (ref 0.0–3.0)
Eosinophils Absolute: 0.2 10*3/uL (ref 0.0–0.7)
Eosinophils Relative: 2.2 % (ref 0.0–5.0)
HCT: 40.2 % (ref 36.0–46.0)
Hemoglobin: 13.5 g/dL (ref 12.0–15.0)
LYMPHS ABS: 1.7 10*3/uL (ref 0.7–4.0)
Lymphocytes Relative: 22.5 % (ref 12.0–46.0)
MCHC: 33.6 g/dL (ref 30.0–36.0)
MCV: 84.6 fl (ref 78.0–100.0)
MONOS PCT: 6 % (ref 3.0–12.0)
Monocytes Absolute: 0.4 10*3/uL (ref 0.1–1.0)
NEUTROS ABS: 5.1 10*3/uL (ref 1.4–7.7)
NEUTROS PCT: 68.6 % (ref 43.0–77.0)
Platelets: 250 10*3/uL (ref 150.0–400.0)
RBC: 4.75 Mil/uL (ref 3.87–5.11)
RDW: 14.2 % (ref 11.5–15.5)
WBC: 7.4 10*3/uL (ref 4.0–10.5)

## 2018-08-07 LAB — TSH: TSH: 3.56 u[IU]/mL (ref 0.35–4.50)

## 2018-08-07 LAB — LIPID PANEL
CHOLESTEROL: 190 mg/dL (ref 0–200)
HDL: 61.4 mg/dL (ref >39.00)
LDL Cholesterol: 100 mg/dL — ABNORMAL HIGH (ref 0–99)
NonHDL: 128.86
TRIGLYCERIDES: 142 mg/dL (ref 0.0–149.0)
Total CHOL/HDL Ratio: 3
VLDL: 28.4 mg/dL (ref 0.0–40.0)

## 2018-08-07 NOTE — Progress Notes (Signed)
Subjective:    Patient ID: Joanne BentonMary K Katt, female    DOB: 12/31/1980, 38 y.o.   MRN: [id number]  HPI  Patient presents for yearly preventative medicine examination. [name] 38 year old female who  has a past medical history of Anxiety, Depression, and Migraine. [name] a patient of Dr. Tawanna Coolerodd   Anxiety and depression - Had her Zoloft increased to 200mg  daily and takes Xanax PRN  Insomnia - was recently prescribed Ambien 10 mg from her psychiatrist.   GERD - takes protonix 40 mg - feels controlled.   All immunizations and health maintenance protocols were reviewed with the patient and needed orders were placed. [name] UTD   Appropriate screening laboratory values were ordered for the patient including screening of hyperlipidemia, renal function and hepatic function.  Medication reconciliation,  past medical history, social history, problem list and allergies were reviewed in detail with the patient  Goals were established with regard to weight loss, exercise, and  diet in compliance with medications. She has been trying to walk multiple times a week and working on eating healthy.   Review of Systems  Constitutional: Negative.   HENT: Negative.   Eyes: Negative.   Respiratory: Negative.   Cardiovascular: Negative.   Gastrointestinal: Negative.   Endocrine: Negative.   Genitourinary: Negative.   Musculoskeletal: Negative.   Skin: Negative.   Allergic/Immunologic: Negative.   Neurological: Positive for numbness (bilateral arms, chronic ).  Hematological: Negative.   Psychiatric/Behavioral: Positive for sleep disturbance. The patient is nervous/anxious.   All other systems reviewed and are negative.    Past Medical History:  Diagnosis Date  . Anxiety    panic attack  . Depression   . Migraine     Social History   Socioeconomic History  . Marital status: Married    Spouse name: Not on file  . Number of children: Not on file  . Years of education: Not on file  . Highest  education level: Not on file  Occupational History  . Not on file  Social Needs  . Financial resource strain: Not on file  . Food insecurity:    Worry: Not on file    Inability: Not on file  . Transportation needs:    Medical: Not on file    Non-medical: Not on file  Tobacco Use  . Smoking status: Never Smoker  . Smokeless tobacco: Never Used  Substance and Sexual Activity  . Alcohol use: No  . Drug use: No  . Sexual activity: Not Currently    Birth control/protection: Diaphragm  Lifestyle  . Physical activity:    Days per week: Not on file    Minutes per session: Not on file  . Stress: Not on file  Relationships  . Social connections:    Talks on phone: Not on file    Gets together: Not on file    Attends religious service: Not on file    Active member of club or organization: Not on file    Attends meetings of clubs or organizations: Not on file    Relationship status: Not on file  . Intimate partner violence:    Fear of current or ex partner: Not on file    Emotionally abused: Not on file    Physically abused: Not on file    Forced sexual activity: Not on file  Other Topics Concern  . Not on file  Social History Narrative  . Not on file    Past Surgical History:  Procedure Laterality Date  . CHOLECYSTECTOMY  10/02/11    Family History  Problem Relation Age of Onset  . Diabetes Mother   . Hyperlipidemia Mother   . Heart disease Mother   . Diabetes Father   . Hyperlipidemia Father   . Heart disease Father   . Cancer Maternal Grandmother        colon and breast  . Cancer Paternal Grandmother        colon    Allergies  Allergen Reactions  . Erythromycin Nausea Only and Other (See Comments)    REACTION: Upset GI    Current Outpatient Medications on File Prior to Visit  Medication Sig Dispense Refill  . ALPRAZolam (XANAX PO) Take 2 mg by mouth daily.    . pantoprazole (PROTONIX) 40 MG tablet Take 40 mg by mouth daily.  1  . Sertraline HCl (ZOLOFT  PO) Take 200 mg by mouth daily.     Marland Kitchen zolpidem (AMBIEN) 10 MG tablet Take 1 tablet before bedtime  0   No current facility-administered medications on file prior to visit.     BP 120/84   Temp 98.4 F (36.9 C)   Wt 167 lb (75.8 kg)   BMI 26.16 kg/m      Objective:   Physical Exam  Constitutional: [name] oriented to person, place, and time. She appears well-developed and well-nourished. No distress.  HENT:  Head: Normocephalic and atraumatic.  Right Ear: External ear normal.  Left Ear: External ear normal.  Nose: Nose normal.  Mouth/Throat: Oropharynx is clear and moist. No oropharyngeal exudate.  Eyes: Pupils are equal, round, and reactive to light. Conjunctivae and EOM are normal. Right eye exhibits no discharge. Left eye exhibits no discharge. No scleral icterus.  Neck: Normal range of motion. Neck supple. No JVD present. No tracheal deviation present. No thyromegaly present.  Cardiovascular: Normal rate, regular rhythm, normal heart sounds and intact distal pulses. Exam reveals no gallop and no friction rub.  No murmur heard. Pulmonary/Chest: Effort normal and breath sounds normal. No stridor. No respiratory distress. She has no wheezes. She has no rales. She exhibits no tenderness.  Abdominal: Soft. Bowel sounds are normal. She exhibits no distension and no mass. There is no tenderness. There is no rebound and no guarding. No hernia.  Musculoskeletal: Normal range of motion. She exhibits no edema, tenderness or deformity.  Lymphadenopathy:    She has no cervical adenopathy.  Neurological: [name] alert and oriented to person, place, and time. She has normal strength. She displays no atrophy, no tremor and normal reflexes. No cranial nerve deficit or sensory deficit. She exhibits normal muscle tone. She displays no seizure activity. Coordination normal.  Skin: Skin is warm and dry. Capillary refill takes less than 2 seconds. No rash noted. [name] not diaphoretic. No erythema. No  pallor.  Psychiatric: She has a normal mood and affect. Her behavior is normal. Judgment and thought content normal.  Nursing note and vitals reviewed.      Assessment & Plan:  1. Routine general medical examination at a health care facility - Encouraged continued exercise and heart healthy diet  - Follow up in one year with PCP or sooner if needed - Basic metabolic panel - CBC with Differential/Platelet - Hepatic function panel - Lipid panel - TSH  2. Anxiety - Controlled  - Continue with POC by psychiatry       3. Insomnia, unspecified type - Continue with POC by psychiatry    Shirline Frees,  NP

## 2018-08-22 ENCOUNTER — Ambulatory Visit: Payer: BLUE CROSS/BLUE SHIELD | Admitting: Adult Health

## 2018-08-29 ENCOUNTER — Ambulatory Visit: Payer: BLUE CROSS/BLUE SHIELD | Admitting: Adult Health

## 2018-08-29 ENCOUNTER — Encounter: Payer: Self-pay | Admitting: Adult Health

## 2018-08-29 ENCOUNTER — Ambulatory Visit (INDEPENDENT_AMBULATORY_CARE_PROVIDER_SITE_OTHER): Payer: BLUE CROSS/BLUE SHIELD

## 2018-08-29 VITALS — BP 106/70 | Temp 98.0°F | Wt 169.0 lb

## 2018-08-29 DIAGNOSIS — M5412 Radiculopathy, cervical region: Secondary | ICD-10-CM

## 2018-08-29 DIAGNOSIS — M50322 Other cervical disc degeneration at C5-C6 level: Secondary | ICD-10-CM | POA: Diagnosis not present

## 2018-08-29 MED ORDER — METHYLPREDNISOLONE 4 MG PO TBPK: ORAL_TABLET | ORAL | 0 refills | Status: DC

## 2018-08-29 MED ORDER — CYCLOBENZAPRINE HCL 5 MG PO TABS: 5.0000 mg | ORAL_TABLET | Freq: Three times a day (TID) | ORAL | 0 refills | Status: DC | PRN

## 2018-08-29 NOTE — Progress Notes (Signed)
Subjective:    Patient ID: Joanne Molina, female    DOB: 1980-04-05, 38 y.o.   MRN: [id number]  HPI  38 year old female who  has a past medical history of Anxiety, Depression, and Migraine.  She presents to the office today for a chronic complaint of bilateral arm numbness/tingling and pain in the back of her shoulder blades. She reports that the pain started four years ago and has become progressively worse. Reports symptoms are intermittent. She reports numbness and tingling is worse at night.     Review of Systems See HPI   Past Medical History:  Diagnosis Date  . Anxiety    panic attack  . Depression   . Migraine     Social History   Socioeconomic History  . Marital status: Married    Spouse name: Not on file  . Number of children: Not on file  . Years of education: Not on file  . Highest education level: Not on file  Occupational History  . Not on file  Social Needs  . Financial resource strain: Not on file  . Food insecurity:    Worry: Not on file    Inability: Not on file  . Transportation needs:    Medical: Not on file    Non-medical: Not on file  Tobacco Use  . Smoking status: Never Smoker  . Smokeless tobacco: Never Used  Substance and Sexual Activity  . Alcohol use: No  . Drug use: No  . Sexual activity: Not Currently    Birth control/protection: Diaphragm  Lifestyle  . Physical activity:    Days per week: Not on file    Minutes per session: Not on file  . Stress: Not on file  Relationships  . Social connections:    Talks on phone: Not on file    Gets together: Not on file    Attends religious service: Not on file    Active member of club or organization: Not on file    Attends meetings of clubs or organizations: Not on file    Relationship status: Not on file  . Intimate partner violence:    Fear of current or ex partner: Not on file    Emotionally abused: Not on file    Physically abused: Not on file    Forced sexual activity: Not on  file  Other Topics Concern  . Not on file  Social History Narrative  . Not on file    Past Surgical History:  Procedure Laterality Date  . CHOLECYSTECTOMY  10/02/11    Family History  Problem Relation Age of Onset  . Diabetes Mother   . Hyperlipidemia Mother   . Heart disease Mother   . Diabetes Father   . Hyperlipidemia Father   . Heart disease Father   . Cancer Maternal Grandmother        colon and breast  . Cancer Paternal Grandmother        colon    Allergies  Allergen Reactions  . Erythromycin Nausea Only and Other (See Comments)    REACTION: Upset GI    Current Outpatient Medications on File Prior to Visit  Medication Sig Dispense Refill  . ALPRAZolam (XANAX PO) Take 2 mg by mouth daily.    . pantoprazole (PROTONIX) 40 MG tablet Take 40 mg by mouth daily.  1  . Sertraline HCl (ZOLOFT PO) Take 200 mg by mouth daily.     Marland Kitchen zolpidem (AMBIEN) 10 MG tablet Take 1 tablet  before bedtime  0   No current facility-administered medications on file prior to visit.     BP 106/70   Temp 98 F (36.7 C) (Oral)   Wt 169 lb (76.7 kg)   BMI 26.47 kg/m       Objective:   Physical Exam  Constitutional: [name] oriented to person, place, and time. She appears well-developed and well-nourished. No distress.  Cardiovascular: Normal rate, regular rhythm, normal heart sounds and intact distal pulses.  Pulmonary/Chest: Effort normal and breath sounds normal.  Musculoskeletal: Normal range of motion. She exhibits no edema, tenderness or deformity.  Neurological: [name] alert and oriented to person, place, and time.  Skin: Skin is warm and dry. Capillary refill takes less than 2 seconds. No rash noted. [name] not diaphoretic. No erythema. No pallor.  Psychiatric: She has a normal mood and affect. Her behavior is normal. Judgment and thought content normal.  Nursing note and vitals reviewed.     Assessment & Plan:  1. Cervical radiculopathy - cyclobenzaprine (FLEXERIL) 5 MG  tablet; Take 1 tablet (5 mg total) by mouth 3 (three) times daily as needed for muscle spasms.  Dispense: 20 tablet; Refill: 0 - DG Cervical Spine Complete; Future - methylPREDNISolone (MEDROL DOSEPAK) 4 MG TBPK tablet; Take as directed  Dispense: 21 tablet; Refill: 0 - consider MRI in the future   Shirline Freesory Chalisa Kobler, NP

## 2018-09-15 DIAGNOSIS — J209 Acute bronchitis, unspecified: Secondary | ICD-10-CM | POA: Diagnosis not present

## 2018-09-15 DIAGNOSIS — F411 Generalized anxiety disorder: Secondary | ICD-10-CM | POA: Diagnosis not present

## 2018-10-21 ENCOUNTER — Encounter: Payer: Self-pay | Admitting: Emergency Medicine

## 2018-10-21 DIAGNOSIS — F411 Generalized anxiety disorder: Secondary | ICD-10-CM | POA: Insufficient documentation

## 2018-10-21 DIAGNOSIS — F424 Excoriation (skin-picking) disorder: Secondary | ICD-10-CM | POA: Insufficient documentation

## 2018-10-21 DIAGNOSIS — F329 Major depressive disorder, single episode, unspecified: Secondary | ICD-10-CM

## 2018-10-21 DIAGNOSIS — N943 Premenstrual tension syndrome: Secondary | ICD-10-CM | POA: Insufficient documentation

## 2018-10-31 ENCOUNTER — Ambulatory Visit: Payer: Self-pay | Admitting: Physician Assistant

## 2018-11-13 ENCOUNTER — Telehealth: Payer: BLUE CROSS/BLUE SHIELD | Admitting: Physician Assistant

## 2018-11-13 DIAGNOSIS — J208 Acute bronchitis due to other specified organisms: Secondary | ICD-10-CM

## 2018-11-13 DIAGNOSIS — B9689 Other specified bacterial agents as the cause of diseases classified elsewhere: Secondary | ICD-10-CM

## 2018-11-13 MED ORDER — BENZONATATE 100 MG PO CAPS: 100.0000 mg | ORAL_CAPSULE | Freq: Three times a day (TID) | ORAL | 0 refills | Status: DC | PRN

## 2018-11-13 MED ORDER — DOXYCYCLINE HYCLATE 100 MG PO CAPS: 100.0000 mg | ORAL_CAPSULE | Freq: Two times a day (BID) | ORAL | 0 refills | Status: DC

## 2018-11-13 NOTE — Progress Notes (Signed)

## 2018-11-25 ENCOUNTER — Ambulatory Visit: Payer: Self-pay

## 2018-11-25 NOTE — Telephone Encounter (Signed)
Pt stated that she has had cough, shortness of breath and intermittent wheezing since first of September. Pt stated that she is having a productive cough with clear phlegm and shortness of breath with exertion. Pt c/o runny nose. Pt c/o deep barky cough. Pt stated that she was seen in September and prescribed Ventolin inhaler, Prednisone and a Z pack. Pt stated that initally she felt better but now the symptoms are coming back. Pt stated she has been taking Mucinex and was prescribed Doxycycline. Pt stated she has been using Flonase and OTC Zyrtec without benefit. Pt stated she did better after taking Prednisone. Pt denies fever, dizziness, chest pain.  Care advice given to pt and pt verbalized understanding. Appt rescheduled to tomorrow at 1:00 pm.

## 2018-11-25 NOTE — Telephone Encounter (Signed)
This encounter was created in error - please disregard.

## 2018-11-25 NOTE — Telephone Encounter (Signed)
   Reason for Disposition . [1] MODERATE longstanding difficulty breathing (e.g., speaks in phrases, SOB even at rest, pulse 100-120) AND [2] SAME as normal  Answer Assessment - Initial Assessment Questions 1. RESPIRATORY STATUS: "Describe your breathing?" (e.g., wheezing, shortness of breath, unable to speak, severe coughing)      Intermittent wheezing with exertion, severe coughing episodes 2. ONSET: "When did this breathing problem begin?"      Beginning of September 3. PATTERN "Does the difficult breathing come and go, or has it been constant since it started?"      Comes and goes  4. SEVERITY: "How bad is your breathing?" (e.g., mild, moderate, severe)    - MILD: No SOB at rest, mild SOB with walking, speaks normally in sentences, can lay down, no retractions, pulse < 100.    - MODERATE: SOB at rest, SOB with minimal exertion and prefers to sit, cannot lie down flat, speaks in phrases, mild retractions, audible wheezing, pulse 100-120.    - SEVERE: Very SOB at rest, speaks in single words, struggling to breathe, sitting hunched forward, retractions, pulse > 120      mild 5. RECURRENT SYMPTOM: "Have you had difficulty breathing before?" If so, ask: "When was the last time?" and "What happened that time?"      Yes- same time last year breathing tx, prednisone, z pack 6. CARDIAC HISTORY: "Do you have any history of heart disease?" (e.g., heart attack, angina, bypass surgery, angioplasty)      No takes propanolol to reduce heart rate due to anxiety 7. LUNG HISTORY: "Do you have any history of lung disease?"  (e.g., pulmonary embolus, asthma, emphysema)     no 8. CAUSE: "What do you think is causing the breathing problem?"      allergies 9. OTHER SYMPTOMS: "Do you have any other symptoms? (e.g., dizziness, runny nose, cough, chest pain, fever)    Productive  Cough (clear,thin), intermittent wheezing, runny nose 10. PREGNANCY: "Is there any chance you are pregnant?" "When was your last  menstrual period?" No- LMP: 3rd week of Nov 11. TRAVEL: "Have you traveled out of the country in the last month?" (e.g., travel history, exposures)       no  Protocols used: BREATHING DIFFICULTY-A-AH

## 2018-11-26 ENCOUNTER — Encounter: Payer: Self-pay | Admitting: Adult Health

## 2018-11-26 ENCOUNTER — Ambulatory Visit (INDEPENDENT_AMBULATORY_CARE_PROVIDER_SITE_OTHER): Payer: BLUE CROSS/BLUE SHIELD

## 2018-11-26 ENCOUNTER — Ambulatory Visit: Payer: BLUE CROSS/BLUE SHIELD | Admitting: Adult Health

## 2018-11-26 VITALS — BP 120/90 | HR 78 | Temp 98.4°F | Wt 167.0 lb

## 2018-11-26 DIAGNOSIS — J069 Acute upper respiratory infection, unspecified: Secondary | ICD-10-CM

## 2018-11-26 DIAGNOSIS — R05 Cough: Secondary | ICD-10-CM | POA: Diagnosis not present

## 2018-11-26 MED ORDER — HYDROCODONE-HOMATROPINE 5-1.5 MG/5ML PO SYRP: 5.0000 mL | ORAL_SOLUTION | Freq: Three times a day (TID) | ORAL | 0 refills | Status: DC | PRN

## 2018-11-26 MED ORDER — IPRATROPIUM-ALBUTEROL 0.5-2.5 (3) MG/3ML IN SOLN
3.0000 mL | Freq: Once | RESPIRATORY_TRACT | Status: AC
  Administered 2018-11-26: 3 mL via RESPIRATORY_TRACT

## 2018-11-26 MED ORDER — IPRATROPIUM-ALBUTEROL 0.5-2.5 (3) MG/3ML IN SOLN: 3.0000 mL | Freq: Once | RESPIRATORY_TRACT | Status: DC

## 2018-11-26 MED ORDER — PREDNISONE 10 MG PO TABS: ORAL_TABLET | ORAL | 0 refills | Status: DC

## 2018-11-26 NOTE — Addendum Note (Signed)
Addended by: Raj JanusADKINS, Lorilee Cafarella T on: 11/26/2018 02:54 PM   Modules accepted: Orders

## 2018-11-26 NOTE — Progress Notes (Signed)
Subjective:    Patient ID: Joanne Molina, female    DOB: 10/02/1980, 38 y.o.   MRN: [id number]  Was seen at urgent care in September and was given a z pack and prednisone, she was making improvements but then a month later her symptoms returned and she did an E-visit about 10 days ago and was prescribed Doxycycline and Tessalon pearls, which she reports is not helping much   URI   This is a new problem. The current episode started more than 1 month ago. The problem has been waxing and waning. There has been no fever. Associated symptoms include congestion, coughing (semi productive ) and wheezing. Pertinent negatives include no rhinorrhea or sinus pain. She has tried decongestant for the symptoms.    Review of Systems  Constitutional: Positive for fatigue. Negative for activity change, appetite change and fever.  HENT: Positive for congestion and sinus pressure. Negative for facial swelling, postnasal drip, rhinorrhea and sinus pain.   Respiratory: Positive for cough (semi productive ), chest tightness, shortness of breath and wheezing.   Cardiovascular: Negative.   Gastrointestinal: Negative.   Genitourinary: Negative.   Hematological: Negative.   All other systems reviewed and are negative.  Past Medical History:  Diagnosis Date  . Anxiety    panic attack  . Depression   . Migraine     Social History   Socioeconomic History  . Marital status: Married    Spouse name: Not on file  . Number of children: Not on file  . Years of education: Not on file  . Highest education level: Not on file  Occupational History  . Not on file  Social Needs  . Financial resource strain: Not on file  . Food insecurity:    Worry: Not on file    Inability: Not on file  . Transportation needs:    Medical: Not on file    Non-medical: Not on file  Tobacco Use  . Smoking status: Never Smoker  . Smokeless tobacco: Never Used  Substance and Sexual Activity  . Alcohol use: No  . Drug use: No    . Sexual activity: Not Currently    Birth control/protection: Diaphragm  Lifestyle  . Physical activity:    Days per week: Not on file    Minutes per session: Not on file  . Stress: Not on file  Relationships  . Social connections:    Talks on phone: Not on file    Gets together: Not on file    Attends religious service: Not on file    Active member of club or organization: Not on file    Attends meetings of clubs or organizations: Not on file    Relationship status: Not on file  . Intimate partner violence:    Fear of current or ex partner: Not on file    Emotionally abused: Not on file    Physically abused: Not on file    Forced sexual activity: Not on file  Other Topics Concern  . Not on file  Social History Narrative  . Not on file    Past Surgical History:  Procedure Laterality Date  . CHOLECYSTECTOMY  10/02/11    Family History  Problem Relation Age of Onset  . Diabetes Mother   . Hyperlipidemia Mother   . Heart disease Mother   . Diabetes Father   . Hyperlipidemia Father   . Heart disease Father   . Cancer Maternal Grandmother        colon  and breast  . Cancer Paternal Grandmother        colon    Allergies  Allergen Reactions  . Erythromycin Nausea Only and Other (See Comments)    REACTION: Upset GI    Current Outpatient Medications on File Prior to Visit  Medication Sig Dispense Refill  . ALPRAZolam (XANAX PO) Take 2 mg by mouth daily.    . pantoprazole (PROTONIX) 40 MG tablet Take 40 mg by mouth daily.  1  . propranolol (INDERAL) 20 MG tablet Take 20 mg by mouth daily.    . sertraline (ZOLOFT) 100 MG tablet Take 200 mg by mouth every morning.     . zaleplon (SONATA) 10 MG capsule Take 10 mg by mouth at bedtime as needed for sleep.    . benzonatate (TESSALON) 100 MG capsule Take 1 capsule (100 mg total) by mouth 3 (three) times daily as needed for cough. (Patient not taking: Reported on 11/26/2018) 30 capsule 0   No current facility-administered  medications on file prior to visit.     BP 120/90   Pulse 78   Temp 98.4 F (36.9 C)   Wt 167 lb (75.8 kg)   SpO2 98%   BMI 26.16 kg/m       Objective:   Physical Exam  Constitutional: [name] oriented to person, place, and time. She appears well-developed and well-nourished. No distress.  Cardiovascular: Normal rate, regular rhythm, normal heart sounds and intact distal pulses. Exam reveals no gallop and no friction rub.  No murmur heard. Pulmonary/Chest: Effort normal. She has wheezes (coarse expiratory ) in the right upper field, the right middle field, the right lower field, the left upper field, the left middle field and the left lower field. She has no rhonchi. She has no rales.  Neurological: [name] alert and oriented to person, place, and time.  Skin: Skin is warm and dry. Capillary refill takes less than 2 seconds. [name] not diaphoretic.  Psychiatric: She has a normal mood and affect. Her behavior is normal. Judgment and thought content normal.  Nursing note and vitals reviewed.     Assessment & Plan:  1. Upper respiratory tract infection, unspecified type - likely bronchitis.  - DG Chest 2 View; Future - ipratropium-albuterol (DUONEB) 0.5-2.5 (3) MG/3ML nebulizer solution 3 mL - predniSONE (DELTASONE) 10 MG tablet; 40 mg x 3 days, 20 mg x 3 days, 10 mg x 3 days  Dispense: 21 tablet; Refill: 0 - HYDROcodone-homatropine (HYCODAN) 5-1.5 MG/5ML syrup; Take 5 mLs by mouth every 8 (eight) hours as needed for cough.  Dispense: 120 mL; Refill: 0 - Follow up if no improvement in the next 2-3 days   - Patient endorsed improvement in breathing after duoneb and felt less SOB. Wheezing had resolved in all lung fields.   Shirline Freesory Kamica Florance, NP

## 2018-11-26 NOTE — Addendum Note (Signed)
Addended by: Conrad BurlingtonAUSTIN, Wylene Weissman on: 11/26/2018 01:34 PM   Modules accepted: Orders

## 2018-12-03 ENCOUNTER — Ambulatory Visit: Payer: BLUE CROSS/BLUE SHIELD | Admitting: Adult Health

## 2018-12-27 ENCOUNTER — Emergency Department (HOSPITAL_COMMUNITY)
Admission: EM | Admit: 2018-12-27 | Discharge: 2018-12-28 | Disposition: A | Discharge status: Home / Self Care | Payer: BLUE CROSS/BLUE SHIELD | Attending: Emergency Medicine | Admitting: Emergency Medicine

## 2018-12-27 ENCOUNTER — Other Ambulatory Visit: Payer: Self-pay

## 2018-12-27 DIAGNOSIS — J4521 Mild intermittent asthma with (acute) exacerbation: Secondary | ICD-10-CM | POA: Diagnosis not present

## 2018-12-27 DIAGNOSIS — Z79899 Other long term (current) drug therapy: Secondary | ICD-10-CM | POA: Insufficient documentation

## 2018-12-27 DIAGNOSIS — R0602 Shortness of breath: Secondary | ICD-10-CM | POA: Diagnosis not present

## 2018-12-27 DIAGNOSIS — R000 Tachycardia, unspecified: Secondary | ICD-10-CM | POA: Diagnosis not present

## 2018-12-27 MED ORDER — ALBUTEROL SULFATE (2.5 MG/3ML) 0.083% IN NEBU
5.0000 mg | INHALATION_SOLUTION | Freq: Once | RESPIRATORY_TRACT | Status: AC
  Administered 2018-12-28: 5 mg via RESPIRATORY_TRACT
  Filled 2018-12-27: qty 6

## 2018-12-27 NOTE — ED Triage Notes (Signed)
PT c/o wheezing.  Using rescue inhaler. Coughing up brown mucus. Diagnosed with bronchitis, on prednisone.

## 2018-12-28 ENCOUNTER — Emergency Department (HOSPITAL_COMMUNITY): Payer: BLUE CROSS/BLUE SHIELD

## 2018-12-28 DIAGNOSIS — R0602 Shortness of breath: Secondary | ICD-10-CM | POA: Diagnosis not present

## 2018-12-28 LAB — I-STAT CHEM 8, ED
BUN: 12 mg/dL (ref 6–20)
CREATININE: 1 mg/dL (ref 0.44–1.00)
Calcium, Ion: 1.17 mmol/L (ref 1.15–1.40)
Chloride: 106 mmol/L (ref 98–111)
Glucose, Bld: 120 mg/dL — ABNORMAL HIGH (ref 70–99)
HCT: 40 % (ref 36.0–46.0)
HEMOGLOBIN: 13.6 g/dL (ref 12.0–15.0)
Potassium: 3.8 mmol/L (ref 3.5–5.1)
Sodium: 139 mmol/L (ref 135–145)
TCO2: 23 mmol/L (ref 22–32)

## 2018-12-28 LAB — I-STAT BETA HCG BLOOD, ED (MC, WL, AP ONLY)

## 2018-12-28 MED ORDER — PREDNISONE 10 MG PO TABS: ORAL_TABLET | ORAL | 0 refills | Status: AC

## 2018-12-28 MED ORDER — PREDNISONE 20 MG PO TABS
60.0000 mg | ORAL_TABLET | Freq: Once | ORAL | Status: AC
  Administered 2018-12-28: 60 mg via ORAL
  Filled 2018-12-28: qty 3

## 2018-12-28 MED ORDER — FLUTICASONE PROPIONATE HFA 110 MCG/ACT IN AERO: 1.0000 | INHALATION_SPRAY | Freq: Two times a day (BID) | RESPIRATORY_TRACT | 1 refills | Status: DC

## 2018-12-28 MED ORDER — IPRATROPIUM-ALBUTEROL 0.5-2.5 (3) MG/3ML IN SOLN
3.0000 mL | Freq: Once | RESPIRATORY_TRACT | Status: AC
  Administered 2018-12-28: 3 mL via RESPIRATORY_TRACT

## 2018-12-28 MED ORDER — SODIUM CHLORIDE 0.9 % IV BOLUS
1000.0000 mL | Freq: Once | INTRAVENOUS | Status: AC
  Administered 2018-12-28: 1000 mL via INTRAVENOUS

## 2018-12-28 NOTE — Discharge Instructions (Addendum)
You can start your inhaler now.   I'd recommend discussing this with Kandee KeenCory in the next week, to adjust dosage and possibly set up referral.  Continue your antihistamines and Flonase.

## 2018-12-28 NOTE — ED Provider Notes (Signed)
[facility] Provider Note   CSN: [id number] Arrival date & time: 12/27/18  2241     History   Chief Complaint Chief Complaint  Patient presents with  . Shortness of Breath    HPI Joanne Molina is a 38 y.o. female.  HPI   38 yo F with PMHx seasonal recurrent bronchitis here w/ SOB. Pt states that over the past 1-1.5 months, she has had recurrent cough with wheezing. Over the last few years, she states every year around this time she gets recurrent SOB and requires prednisone. Currently, she has been on two rounds of prednisone intermittently which has significantly transiently helped, and her sx then return. She has associated sinus pressure and fullness, with cough and wheezing. She notices her cough is worse in cold as well. No fevers, no sputm production. She completed a course of doxy several weeks ago w/o significant improvement. Denies any steroid inhaler use. No known h/o asthma but h/o similar sx as mentioned.  Past Medical History:  Diagnosis Date  . Anxiety    panic attack  . Depression   . Migraine     Patient Active Problem List   Diagnosis Date Noted  . GAD (generalized anxiety disorder) 10/21/2018  . MDD (major depressive disorder) 10/21/2018  . PMS (premenstrual syndrome) 10/21/2018  . Skin picking habit 10/21/2018  . Insomnia 08/07/2018  . Anxiety 02/12/2017  . Gestational hypertension 07/12/2016  . Gestational hypertension w/o significant proteinuria in 3rd trimester 07/12/2016  . Urinary frequency 09/09/2012  . Biliary colic 08/30/2011  . Abdominal pain, chronic, right upper quadrant 08/22/2011  . BLURRED VISION 11/22/2007    Past Surgical History:  Procedure Laterality Date  . CHOLECYSTECTOMY  10/02/11     OB History    Gravida  2   Para  2   Term  2   Preterm      AB      Living  2     SAB      TAB      Ectopic      Multiple  0   Live Births  2            Home Medications     Prior to Admission medications   Medication Sig Start Date End Date Taking? Authorizing Provider  ALPRAZolam Prudy Feeler(XANAX) 1 MG tablet Take 1-2 mg by mouth 3 (three) times daily as needed for anxiety.   Yes [provider]  fluticasone (FLONASE) 50 MCG/ACT nasal spray Place 1 spray into both nostrils daily.   Yes [provider]  loratadine (CLARITIN) 10 MG tablet Take 10 mg by mouth daily as needed for allergies.   Yes [provider]  pantoprazole (PROTONIX) 40 MG tablet Take 40 mg by mouth daily. 04/14/17  Yes [provider]  propranolol (INDERAL) 20 MG tablet Take 20 mg by mouth at bedtime.  08/01/18  Yes [provider]  sertraline (ZOLOFT) 100 MG tablet Take 200 mg by mouth daily.  08/01/18  Yes [provider]  benzonatate (TESSALON) 100 MG capsule Take 1 capsule (100 mg total) by mouth 3 (three) times daily as needed for cough. Patient not taking: Reported on 11/26/2018 11/13/18   Waldon MerlMartin, William C, PA-C  fluticasone (FLOVENT HFA) 110 MCG/ACT inhaler Inhale 1 puff into the lungs 2 (two) times daily. 12/28/18   Shaune PollackIsaacs, Licet Dunphy, MD  HYDROcodone-homatropine [facility](HYCODAN) 5-1.5 MG/5ML syrup Take 5 mLs by mouth every 8 (eight) hours as needed for cough.  Patient not taking: Reported on 12/28/2018 11/26/18   Shirline FreesNafziger, Cory, NP  predniSONE (DELTASONE) 10 MG tablet Take 4 tablets (40 mg total) by mouth daily for 2 days, THEN 3 tablets (30 mg total) daily for 2 days, THEN 2 tablets (20 mg total) daily for 2 days, THEN 1 tablet (10 mg total) daily for 2 days. 12/28/18 01/05/19  Shaune PollackIsaacs, Latonia Conrow, MD    Family History Family History  Problem Relation Age of Onset  . Diabetes Mother   . Hyperlipidemia Mother   . Heart disease Mother   . Diabetes Father   . Hyperlipidemia Father   . Heart disease Father   . Cancer Maternal Grandmother        colon and breast  . Cancer Paternal Grandmother        colon    Social History Social History   Tobacco Use   . Smoking status: Never Smoker  . Smokeless tobacco: Never Used  Substance Use Topics  . Alcohol use: No  . Drug use: No     Allergies   Erythromycin   Review of Systems Review of Systems  Constitutional: Positive for fatigue. Negative for chills and fever.  HENT: Negative for congestion and rhinorrhea.   Eyes: Negative for visual disturbance.  Respiratory: Positive for cough, shortness of breath and wheezing.   Cardiovascular: Negative for chest pain and leg swelling.  Gastrointestinal: Negative for abdominal pain, diarrhea, nausea and vomiting.  Genitourinary: Negative for dysuria and flank pain.  Musculoskeletal: Negative for neck pain and neck stiffness.  Skin: Negative for rash and wound.  Allergic/Immunologic: Negative for immunocompromised state.  Neurological: Negative for syncope, weakness and headaches.  All other systems reviewed and are negative.    Physical Exam Updated Vital Signs BP 123/78 (BP Location: Right Arm)   Pulse (!) 115   Temp 98.2 F (36.8 C) (Oral)   Resp 16   Ht 5\' 7"  (1.702 m)   Wt 73 kg   LMP 12/05/2018 (LMP Unknown)   SpO2 98%   BMI 25.22 kg/m   Physical Exam Vitals signs and nursing note reviewed.  Constitutional:      General: [name] not in acute distress.    Appearance: [name] well-developed.  HENT:     Head: Normocephalic and atraumatic.  Eyes:     Conjunctiva/sclera: Conjunctivae normal.     Pupils: Pupils are equal, round, and reactive to light.  Neck:     Musculoskeletal: Neck supple.  Cardiovascular:     Rate and Rhythm: Regular rhythm. Tachycardia present.     Heart sounds: Normal heart sounds. No murmur. No friction rub.  Pulmonary:     Effort: Pulmonary effort is normal. Tachypnea present. No respiratory distress.     Breath sounds: Examination of the right-upper field reveals wheezing. Examination of the left-upper field reveals wheezing. Examination of the right-middle field reveals wheezing. Examination of the  left-middle field reveals wheezing. Examination of the right-lower field reveals wheezing. Examination of the left-lower field reveals wheezing. Wheezing present. No rales.  Abdominal:     General: There is no distension.     Palpations: Abdomen is soft.     Tenderness: There is no abdominal tenderness.  Skin:    General: Skin is warm.     Capillary Refill: Capillary refill takes less than 2 seconds.  Neurological:     Mental Status: [name] alert and oriented to person, place, and time.     Motor: No abnormal muscle tone.  ED Treatments / Results  Labs (all labs ordered are listed, but only abnormal results are displayed) Labs Reviewed  I-STAT CHEM 8, ED - Abnormal; Notable for the following components:      Result Value   Glucose, Bld 120 (*)    All other components within normal limits  I-STAT BETA HCG BLOOD, ED ([facility], [facility], [facility] ONLY)    EKG None  Radiology Dg Chest 2 View  Result Date: 12/28/2018 CLINICAL DATA:  Shortness of breath EXAM: CHEST - 2 VIEW COMPARISON:  None. FINDINGS: The heart size and mediastinal contours are within normal limits. Both lungs are clear. The visualized skeletal structures are unremarkable. IMPRESSION: No active cardiopulmonary disease. Electronically Signed   By: Deatra Robinson M.D.   On: 12/28/2018 01:01    Procedures Procedures (including critical care time)  Medications Ordered in ED Medications  albuterol (PROVENTIL) (2.5 MG/3ML) 0.083% nebulizer solution 5 mg (5 mg Nebulization Given 12/28/18 0146)  predniSONE (DELTASONE) tablet 60 mg (60 mg Oral Given 12/28/18 0209)  ipratropium-albuterol (DUONEB) 0.5-2.5 (3) MG/3ML nebulizer solution 3 mL (3 mLs Nebulization Given 12/28/18 0218)  sodium chloride 0.9 % bolus 1,000 mL (1,000 mLs Intravenous New Bag/Given 12/28/18 0210)  ipratropium-albuterol (DUONEB) 0.5-2.5 (3) MG/3ML nebulizer solution 3 mL (3 mLs Nebulization Given 12/28/18 0326)     Initial Impression / Assessment and Plan / ED  Course  I have reviewed the triage vital signs and the nursing notes.  Pertinent labs & imaging results that were available during my care of the patient were reviewed by me and considered in my medical decision making (see chart for details).    38 yo F with h/o recurrent WARI here with wheezing, SOB. This has been an ongoing, seasonal issue. I suspect pt has an underlying component of RAD, possibly made worse by chronic post-nasal drainage from rhinosinusitis. Pt is already on anthistamines. She has responded very well to prednisone intermittently, but sx return shortly after finishing course. In the ED, pt initially with diffuse wheezing that has completely resolved after two nebs. [name] mildly tachycardic (HR 105s) but apparently missed her night-time propranolol dose as well as has a h/o anxiety in ED. Pt was ambulated throughout ED and noted to have sats >90% with normal WOB.   Given her recurrent sx and good response to systemic steroids, risks/benefits discussed and I feel it's pertinent to start an inhaled glucocorticoid. Will also give a taper of prednisone while starting. Otherwise, no sign of PNA or resp distress. I also discussed that she should consider alternatives to propranolol given it's relative contraindication in asthma. Pt expressed understanding, feels much better and is requesting d/c.  Final Clinical Impressions(s) / ED Diagnoses   Final diagnoses:  Mild intermittent reactive airway disease with acute exacerbation    ED Discharge Orders         Ordered    predniSONE (DELTASONE) 10 MG tablet     12/28/18 0428    fluticasone (FLOVENT HFA) 110 MCG/ACT inhaler  2 times daily     12/28/18 0428           Shaune Pollack, MD 12/28/18 [phone]

## 2018-12-29 DIAGNOSIS — R635 Abnormal weight gain: Secondary | ICD-10-CM | POA: Diagnosis not present

## 2018-12-29 DIAGNOSIS — D250 Submucous leiomyoma of uterus: Secondary | ICD-10-CM | POA: Diagnosis not present

## 2018-12-29 DIAGNOSIS — R002 Palpitations: Secondary | ICD-10-CM | POA: Diagnosis not present

## 2018-12-29 DIAGNOSIS — N939 Abnormal uterine and vaginal bleeding, unspecified: Secondary | ICD-10-CM | POA: Diagnosis not present

## 2018-12-29 DIAGNOSIS — N921 Excessive and frequent menstruation with irregular cycle: Secondary | ICD-10-CM | POA: Diagnosis not present

## 2018-12-30 ENCOUNTER — Encounter: Payer: Self-pay | Admitting: Physician Assistant

## 2018-12-30 ENCOUNTER — Encounter: Payer: Self-pay | Admitting: Adult Health

## 2018-12-30 ENCOUNTER — Ambulatory Visit: Payer: BLUE CROSS/BLUE SHIELD | Admitting: Physician Assistant

## 2018-12-30 DIAGNOSIS — F329 Major depressive disorder, single episode, unspecified: Secondary | ICD-10-CM

## 2018-12-30 DIAGNOSIS — F411 Generalized anxiety disorder: Secondary | ICD-10-CM

## 2018-12-30 DIAGNOSIS — F424 Excoriation (skin-picking) disorder: Secondary | ICD-10-CM | POA: Diagnosis not present

## 2018-12-30 MED ORDER — ZALEPLON 10 MG PO CAPS: ORAL_CAPSULE | ORAL | 0 refills | Status: DC

## 2018-12-30 NOTE — Progress Notes (Signed)
[facility]  Patient ID: Joanne BentonMary K Molina,  MRN: [id number]  PCP: Roderick Peeodd, Jeffrey A, MD  Date of Evaluation: 12/30/2018 Time spent:25 minutes  Chief Complaint:  Chief Complaint    Anxiety      HISTORY/CURRENT STATUS: HPI Here for 3 month med check.  Has been sick off and on for 4 months and dx w/ asthma. Has been to the ER several times, has had 4 courses of steroids in the past 4 months.  New meds are on chart.  Was told the Inderal might be making the asthma worse.  Plus she had a slightly prolonged QT interval on an EKG that was done in the emergency room a few days ago, but the PCP "isn't worrying about that."  But he did state it could be in part caused by the antidepressant.  "Of course you know I am focusing on the bad stuff.  But the propranolol has been so helpful, I do not want to think about having to go off of it."    Also, is having menses twice a month. And has been dx w/ fibroids.  Labs have been okay and she is not been told that she is anemic.  States it is hard to tell if the anxiety and depression are controlled.  But she does state that since being on these medications, when she does get panicky, she can calm herself down.  Before she was on the medications she was not able to do that.  Now having been on the steroids off and on for 4 months she is not sleeping well and the Sonata has not been as helpful as we had hoped.  "I think the steroids override the sleep medicine."  Individual Medical History/ Review of Systems: Changes? :Yes  See above  Past medications for mental health diagnoses include: Sonata, Paxil, Celexa, Effexor, BuSpar made the anxiety worse, Ativan, Klonopin, Prozac, Xanax  Allergies: Erythromycin  Current Medications:  Current Outpatient Medications:  .  ALPRAZolam (XANAX) 1 MG tablet, Take 1 mg by mouth 2 (two) times daily as needed for anxiety. , Disp: , Rfl:  .  fluticasone (FLONASE) 50 MCG/ACT nasal spray, Place 1 spray into both  nostrils daily., Disp: , Rfl:  .  fluticasone (FLOVENT HFA) 110 MCG/ACT inhaler, Inhale 1 puff into the lungs 2 (two) times daily., Disp: 1 Inhaler, Rfl: 1 .  loratadine (CLARITIN) 10 MG tablet, Take 10 mg by mouth daily as needed for allergies., Disp: , Rfl:  .  pantoprazole (PROTONIX) 40 MG tablet, Take 40 mg by mouth daily., Disp: , Rfl: 1 .  predniSONE (DELTASONE) 10 MG tablet, Take 4 tablets (40 mg total) by mouth daily for 2 days, THEN 3 tablets (30 mg total) daily for 2 days, THEN 2 tablets (20 mg total) daily for 2 days, THEN 1 tablet (10 mg total) daily for 2 days., Disp: 20 tablet, Rfl: 0 .  propranolol (INDERAL) 20 MG tablet, Take 20 mg by mouth at bedtime. , Disp: , Rfl:  .  sertraline (ZOLOFT) 100 MG tablet, Take 200 mg by mouth daily. , Disp: , Rfl:  .  zaleplon (SONATA) 10 MG capsule, One qhs prn and may repeat for MNA if has 3 hours left to sleep., Disp: 60 capsule, Rfl: 0 Medication Side Effects: none  Family Medical/ Social History: Changes? No  MENTAL HEALTH EXAM:  Last menstrual period 12/05/2018, unknown if currently breastfeeding.There is no height or weight on file to calculate BMI.  General Appearance:  Casual and Well Groomed  Eye Contact:  Good  Speech:  Clear and Coherent  Volume:  Normal  Mood:  Euthymic  Affect:  Appropriate  Thought Process:  Goal Directed  Orientation:  Full (Time, Place, and Person)  Thought Content: Logical   Suicidal Thoughts:  No  Homicidal Thoughts:  No  Memory:  WNL  Judgement:  Good  Insight:  Good  Psychomotor Activity:  Normal  Concentration:  Concentration: Good  Recall:  Good  Fund of Knowledge: Good  Language: Good  Assets:  Desire for Improvement  ADL's:  Intact  Cognition: WNL  Prognosis:  Good  (See ER records)  DIAGNOSES:    ICD-10-CM   1. Generalized anxiety disorder F41.1   2. Major depressive disorder with current active episode, unspecified depression episode severity, unspecified whether recurrent F32.9    3. Skin picking habit F42.4     Receiving Psychotherapy: No    RECOMMENDATIONS: I spent 25 minutes with the patient and 50% of that time was spent in counseling reference diagnosis and treatment options.  We discussed the asthma diagnosis and the fact that beta-blockers can worsen that.  [name] on such a low dose of the propranolol that I do not think it is an issue.  I am afraid if we had to take her off of that and the anxiety would worsen to the point that that would cause shortness of breath and then the asthma would worsen from there.  She agrees and we will keep her on the propranolol for now. As far as the borderline prolonged QT interval in my opinion that is not concerning at this point either especially since she is discussed it with her PCP and they agree.  Again, I am concerned that if we take her off the Zoloft (which has low risk of causing problem with QT interval) then that would worsen the anxiety.  Then were in the loop of worsening anxiety, dyspnea, tachycardia, and then that leads back to anxiety.  She verbalizes understanding. At this point we decided to keep all of her medications the same as noted above.  She will probably be seeing a pulmonologist or an allergist at some point in the near future and of course if they believe propranolol is causing a problem with the asthma then we will stop it. Return in 6 weeks or sooner as needed.   Melony Overlyeresa Jorell Agne, PA-C

## 2019-01-02 ENCOUNTER — Encounter: Payer: Self-pay | Admitting: Adult Health

## 2019-01-02 ENCOUNTER — Ambulatory Visit: Payer: BLUE CROSS/BLUE SHIELD | Admitting: Adult Health

## 2019-01-02 VITALS — BP 112/80 | Temp 98.1°F | Wt 169.0 lb

## 2019-01-02 DIAGNOSIS — R9431 Abnormal electrocardiogram [ECG] [EKG]: Secondary | ICD-10-CM

## 2019-01-02 DIAGNOSIS — J4542 Moderate persistent asthma with status asthmaticus: Secondary | ICD-10-CM

## 2019-01-02 NOTE — Progress Notes (Signed)
Subjective:    Patient ID: Joanne Molina, female    DOB: 1980/05/30, 39 y.o.   MRN: [id number]  HPI 39 year old female who  has a past medical history of Anxiety, Depression, and Migraine.  She presents to the office today for follow up after recent ER visit for recurrent bronchitis with shortness of breath. This has been an issue for the last 1.5 months and she has been seen in this office multiple times for the same issue. She has done well with prednisone but after coming off the prednisone her symptoms return.   In the ER she had diffuse wheezing that had resiolved after two nebs treatments. She was mildly tachycardic at 105 but had missed her night time propanolol. EKG showed borderline prolonged QT interval. Will repeat EKG today   She was discharged with Flovent and another prednisone course. She has been doing well on Flovent and feels as though her breathing is controlled. She no longer has SOB or wheezing.  [name] prescribed Propanolol by psychiatry who she has followed up with this week and they feel that since it is such a low dose that this should not be interfering with her breathing.    Review of Systems  Constitutional: Negative.   Respiratory: Negative.   Cardiovascular: Negative.   Gastrointestinal: Negative.   Genitourinary: Negative.   Neurological: Negative.   All other systems reviewed and are negative.  Past Medical History:  Diagnosis Date  . Anxiety    panic attack  . Depression   . Migraine     Social History   Socioeconomic History  . Marital status: Married    Spouse name: Not on file  . Number of children: Not on file  . Years of education: Not on file  . Highest education level: Not on file  Occupational History  . Not on file  Social Needs  . Financial resource strain: Not on file  . Food insecurity:    Worry: Not on file    Inability: Not on file  . Transportation needs:    Medical: Not on file    Non-medical: Not on file  Tobacco  Use  . Smoking status: Never Smoker  . Smokeless tobacco: Never Used  Substance and Sexual Activity  . Alcohol use: No  . Drug use: No  . Sexual activity: Not Currently    Birth control/protection: Diaphragm  Lifestyle  . Physical activity:    Days per week: Not on file    Minutes per session: Not on file  . Stress: Not on file  Relationships  . Social connections:    Talks on phone: Not on file    Gets together: Not on file    Attends religious service: Not on file    Active member of club or organization: Not on file    Attends meetings of clubs or organizations: Not on file    Relationship status: Not on file  . Intimate partner violence:    Fear of current or ex partner: Not on file    Emotionally abused: Not on file    Physically abused: Not on file    Forced sexual activity: Not on file  Other Topics Concern  . Not on file  Social History Narrative  . Not on file    Past Surgical History:  Procedure Laterality Date  . CHOLECYSTECTOMY  10/02/11    Family History  Problem Relation Age of Onset  . Diabetes Mother   . Hyperlipidemia  Mother   . Heart disease Mother   . Diabetes Father   . Hyperlipidemia Father   . Heart disease Father   . Cancer Maternal Grandmother        colon and breast  . Cancer Paternal Grandmother        colon    Allergies  Allergen Reactions  . Erythromycin Nausea Only and Other (See Comments)    REACTION: Upset GI    Current Outpatient Medications on File Prior to Visit  Medication Sig Dispense Refill  . ALPRAZolam (XANAX) 1 MG tablet Take 1 mg by mouth 2 (two) times daily as needed for anxiety.     . fluticasone (FLONASE) 50 MCG/ACT nasal spray Place 1 spray into both nostrils daily.    . fluticasone (FLOVENT HFA) 110 MCG/ACT inhaler Inhale 1 puff into the lungs 2 (two) times daily. 1 Inhaler 1  . loratadine (CLARITIN) 10 MG tablet Take 10 mg by mouth daily as needed for allergies.    . pantoprazole (PROTONIX) 40 MG tablet Take  40 mg by mouth daily.  1  . predniSONE (DELTASONE) 10 MG tablet Take 4 tablets (40 mg total) by mouth daily for 2 days, THEN 3 tablets (30 mg total) daily for 2 days, THEN 2 tablets (20 mg total) daily for 2 days, THEN 1 tablet (10 mg total) daily for 2 days. 20 tablet 0  . propranolol (INDERAL) 20 MG tablet Take 20 mg by mouth at bedtime.     . sertraline (ZOLOFT) 100 MG tablet Take 200 mg by mouth daily.     . zaleplon (SONATA) 10 MG capsule One qhs prn and may repeat for MNA if has 3 hours left to sleep. 60 capsule 0   No current facility-administered medications on file prior to visit.     BP 112/80   Temp 98.1 F (36.7 C)   Wt 169 lb (76.7 kg)   LMP 12/05/2018 (LMP Unknown)   BMI 26.47 kg/m       Objective:   Physical Exam Vitals signs and nursing note reviewed.  Constitutional:      Appearance: Normal appearance. [name] normal weight.  Cardiovascular:     Rate and Rhythm: Normal rate and regular rhythm.     Pulses: Normal pulses.     Heart sounds: Normal heart sounds.  Pulmonary:     Effort: Pulmonary effort is normal. No respiratory distress.     Breath sounds: Normal breath sounds. No stridor. No wheezing, rhonchi or rales.  Chest:     Chest wall: No tenderness.  Skin:    General: Skin is warm and dry.     Capillary Refill: Capillary refill takes less than 2 seconds.  Neurological:     Mental Status: [name] alert.       Assessment & Plan:  1. Abnormal EKG - EKG 12-Lead- NSR, Rate 81   2. Moderate persistent reactive airway disease with status asthmaticus - Will refer to pulmonary due to recurrent theme. I would also like her to call and see [facility]  - Continue with Flovent inhaler  - Ambulatory referral to Pulmonology  Shirline Frees, NP

## 2019-02-06 ENCOUNTER — Institutional Professional Consult (permissible substitution): Payer: BLUE CROSS/BLUE SHIELD | Admitting: Pulmonary Disease

## 2019-02-11 ENCOUNTER — Encounter: Payer: Self-pay | Admitting: Physician Assistant

## 2019-02-11 ENCOUNTER — Ambulatory Visit (INDEPENDENT_AMBULATORY_CARE_PROVIDER_SITE_OTHER): Payer: BLUE CROSS/BLUE SHIELD | Admitting: Physician Assistant

## 2019-02-11 DIAGNOSIS — G4700 Insomnia, unspecified: Secondary | ICD-10-CM | POA: Diagnosis not present

## 2019-02-11 DIAGNOSIS — L700 Acne vulgaris: Secondary | ICD-10-CM | POA: Diagnosis not present

## 2019-02-11 DIAGNOSIS — F411 Generalized anxiety disorder: Secondary | ICD-10-CM | POA: Diagnosis not present

## 2019-02-11 DIAGNOSIS — F424 Excoriation (skin-picking) disorder: Secondary | ICD-10-CM

## 2019-02-11 MED ORDER — ESZOPICLONE 3 MG PO TABS: 3.0000 mg | ORAL_TABLET | Freq: Every day | ORAL | 0 refills | Status: DC

## 2019-02-11 MED ORDER — ALPRAZOLAM 1 MG PO TABS: 1.0000 mg | ORAL_TABLET | Freq: Two times a day (BID) | ORAL | 5 refills | Status: DC | PRN

## 2019-02-11 MED ORDER — PROPRANOLOL HCL 20 MG PO TABS: 20.0000 mg | ORAL_TABLET | Freq: Every day | ORAL | 1 refills | Status: DC

## 2019-02-11 MED ORDER — SERTRALINE HCL 100 MG PO TABS: 200.0000 mg | ORAL_TABLET | Freq: Every day | ORAL | 1 refills | Status: DC

## 2019-02-11 NOTE — Progress Notes (Signed)
[facility]  Patient ID: Joanne Molina,  MRN: [id number]  PCP: Shirline FreesNafziger, Cory, NP  Date of Evaluation: 02/11/2019 Time spent:15 minutes  Chief Complaint:  Chief Complaint    Follow-up      HISTORY/CURRENT STATUS: HPI  Here for routine med check.   Patient denies loss of interest in usual activities and is able to enjoy things.  Denies decreased energy or motivation.  Appetite has not changed.  No extreme sadness, tearfulness, or feelings of hopelessness.  Denies any changes in concentration, making decisions or remembering things.  Denies suicidal or homicidal thoughts.   Anxiety is still there but much better. Feels like the meds are helping.   Still not able to go to sleep well.  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   Denies muscle or joint pain, stiffness, or dystonia.  Denies dizziness, syncope, seizures, numbness, tingling, tremor, tics, unsteady gait, slurred speech, confusion.   Individual Medical History/ Review of Systems: Changes? :Yes  might need a hysterectomy, bleeding heavy. Was dx with asthma.  Flovent is helping a lot.  Past medications for mental health diagnoses include: Sonata, Paxil, Celexa, Effexor, BuSpar made the anxiety worse, Ativan, Klonopin, Prozac, Xanax, Ambien.  Allergies: Erythromycin  Current Medications:  Current Outpatient Medications:  .  albuterol (PROVENTIL HFA;VENTOLIN HFA) 108 (90 Base) MCG/ACT inhaler, Inhale 1 puff into the lungs every 6 (six) hours as needed for wheezing or shortness of breath., Disp: , Rfl:  .  ALPRAZolam (XANAX) 1 MG tablet, Take 1 tablet (1 mg total) by mouth 2 (two) times daily as needed for anxiety., Disp: 60 tablet, Rfl: 5 .  fluticasone (FLONASE) 50 MCG/ACT nasal spray, Place 1 spray into both nostrils daily., Disp: , Rfl:  .  fluticasone (FLOVENT HFA) 110 MCG/ACT inhaler, Inhale 1  puff into the lungs 2 (two) times daily., Disp: 1 Inhaler, Rfl: 1 .  loratadine (CLARITIN) 10 MG tablet, Take 10 mg by mouth daily as needed for allergies., Disp: , Rfl:  .  pantoprazole (PROTONIX) 40 MG tablet, Take 40 mg by mouth daily., Disp: , Rfl: 1 .  propranolol (INDERAL) 20 MG tablet, Take 1 tablet (20 mg total) by mouth at bedtime., Disp: 90 tablet, Rfl: 1 .  sertraline (ZOLOFT) 100 MG tablet, Take 2 tablets (200 mg total) by mouth daily., Disp: 180 tablet, Rfl: 1 .  Eszopiclone (ESZOPICLONE) 3 MG TABS, Take 1 tablet (3 mg total) by mouth at bedtime. Take immediately before bedtime, Disp: 30 tablet, Rfl: 0 Medication Side Effects: none  Family Medical/ Social History: Changes? She's decided to get her Masters instead of going for the PhD.  "I feel very much at peace about it." Has a Masters already in Biology.  Teaches at [employer]  MENTAL HEALTH EXAM:  unknown if currently breastfeeding.There is no height or weight on file to calculate BMI.  General Appearance: Casual and Well Groomed  Eye Contact:  Good  Speech:  Clear and Coherent  Volume:  Normal  Mood:  Euthymic  Affect:  Appropriate  Thought Process:  Goal Directed  Orientation:  Full (Time, Place, and Person)  Thought Content: Logical   Suicidal Thoughts:  No  Homicidal Thoughts:  No  Memory:  WNL  Judgement:  Good  Insight:  Good  Psychomotor Activity:  Normal  Concentration:  Concentration: Good  Recall:  Good  Fund of Knowledge: Good  Language: Good  Assets:  Desire for Improvement  ADL's:  Intact  Cognition: WNL  Prognosis:  Good    DIAGNOSES:    ICD-10-CM   1. Generalized anxiety disorder F41.1   2. Skin picking habit F42.4   3. Insomnia, unspecified type G47.00     Receiving Psychotherapy: No    RECOMMENDATIONS: Start Lunesta 3mg  p.o. nightly as needed.  We discussed sleep hygiene, including amber-colored glasses. Continue Zoloft 100 mg 2 p.o. daily. Continue propranolol 20 mg nightly. Continue  Xanax 1 mg twice daily as needed. Overall, I think she is doing better.  Looking back we both believe that part of the anxiety has been related to the asthma that has gone undiagnosed.  Fortunately she feels better now and hopefully that will help with the anxiety as well. She can call if needs Lunesta refill. Return in 6 months or sooner as needed.   Melony Overly, PA-C

## 2019-02-25 DIAGNOSIS — N920 Excessive and frequent menstruation with regular cycle: Secondary | ICD-10-CM | POA: Diagnosis not present

## 2019-02-25 DIAGNOSIS — N945 Secondary dysmenorrhea: Secondary | ICD-10-CM | POA: Diagnosis not present

## 2019-02-25 DIAGNOSIS — R5383 Other fatigue: Secondary | ICD-10-CM | POA: Diagnosis not present

## 2019-03-04 ENCOUNTER — Institutional Professional Consult (permissible substitution): Payer: BLUE CROSS/BLUE SHIELD | Admitting: Pulmonary Disease

## 2019-03-17 ENCOUNTER — Telehealth: Payer: BLUE CROSS/BLUE SHIELD | Admitting: Nurse Practitioner

## 2019-03-17 ENCOUNTER — Other Ambulatory Visit: Payer: Self-pay | Admitting: Physician Assistant

## 2019-03-17 DIAGNOSIS — N3000 Acute cystitis without hematuria: Secondary | ICD-10-CM

## 2019-03-17 MED ORDER — CEPHALEXIN 500 MG PO CAPS: 500.0000 mg | ORAL_CAPSULE | Freq: Two times a day (BID) | ORAL | 0 refills | Status: DC

## 2019-03-17 NOTE — Progress Notes (Signed)

## 2019-03-18 NOTE — Telephone Encounter (Signed)
Okay to call in but unable to reach the pharmacy

## 2019-03-24 DIAGNOSIS — R531 Weakness: Secondary | ICD-10-CM | POA: Diagnosis not present

## 2019-03-24 DIAGNOSIS — E785 Hyperlipidemia, unspecified: Secondary | ICD-10-CM | POA: Diagnosis not present

## 2019-03-24 DIAGNOSIS — L700 Acne vulgaris: Secondary | ICD-10-CM | POA: Diagnosis not present

## 2019-04-01 DIAGNOSIS — L700 Acne vulgaris: Secondary | ICD-10-CM | POA: Diagnosis not present

## 2019-05-01 DIAGNOSIS — L700 Acne vulgaris: Secondary | ICD-10-CM | POA: Diagnosis not present

## 2019-05-05 DIAGNOSIS — E785 Hyperlipidemia, unspecified: Secondary | ICD-10-CM | POA: Diagnosis not present

## 2019-05-05 DIAGNOSIS — L700 Acne vulgaris: Secondary | ICD-10-CM | POA: Diagnosis not present

## 2019-05-12 ENCOUNTER — Encounter (HOSPITAL_BASED_OUTPATIENT_CLINIC_OR_DEPARTMENT_OTHER): Payer: Self-pay

## 2019-05-12 ENCOUNTER — Ambulatory Visit (HOSPITAL_BASED_OUTPATIENT_CLINIC_OR_DEPARTMENT_OTHER): Admit: 2019-05-12 | Payer: BLUE CROSS/BLUE SHIELD | Admitting: Obstetrics and Gynecology

## 2019-05-12 SURGERY — HYSTERECTOMY, TOTAL, LAPAROSCOPIC, WITH SALPINGECTOMY
Anesthesia: General

## 2019-05-14 ENCOUNTER — Encounter: Payer: Self-pay | Admitting: Internal Medicine

## 2019-05-14 ENCOUNTER — Ambulatory Visit: Payer: BLUE CROSS/BLUE SHIELD | Admitting: Internal Medicine

## 2019-05-14 ENCOUNTER — Other Ambulatory Visit: Payer: Self-pay

## 2019-05-14 VITALS — BP 120/84 | HR 80 | Temp 98.2°F | Ht 67.0 in | Wt 169.0 lb

## 2019-05-14 DIAGNOSIS — J45991 Cough variant asthma: Secondary | ICD-10-CM | POA: Insufficient documentation

## 2019-05-14 DIAGNOSIS — I10 Essential (primary) hypertension: Secondary | ICD-10-CM | POA: Diagnosis not present

## 2019-05-14 HISTORY — DX: Cough variant asthma: J45.991

## 2019-05-14 MED ORDER — MOMETASONE FURO-FORMOTEROL FUM 100-5 MCG/ACT IN AERO: 2.0000 | INHALATION_SPRAY | Freq: Two times a day (BID) | RESPIRATORY_TRACT | 0 refills | Status: DC

## 2019-05-14 MED ORDER — BISOPROLOL FUMARATE 5 MG PO TABS: 5.0000 mg | ORAL_TABLET | Freq: Every day | ORAL | 11 refills | Status: DC

## 2019-05-14 MED ORDER — MOMETASONE FURO-FORMOTEROL FUM 100-5 MCG/ACT IN AERO: INHALATION_SPRAY | RESPIRATORY_TRACT | 11 refills | Status: DC

## 2019-05-14 NOTE — Assessment & Plan Note (Signed)
Try change inderol to bisoprol 5 mg daily 05/14/2019   In the setting of respiratory symptoms of unknown etiology,  It would be preferable to use bystolic, the most beta -1  selective Beta blocker available in sample form, with bisoprolol the most selective generic choice  on the market, at least on a trial basis, to make sure the spillover Beta 2 effects of the less specific Beta blockers are not contributing to this patient's symptoms.    Total time devoted to counseling  > 50 % of initial 60 min office visit:  review case with pt/  device teaching which extended face to face time for this visit discussion of options/alternatives/ personally creating written customized instructions  in presence of pt  then going over those specific  Instructions directly with the pt including how to use all of the meds but in particular covering each new medication in detail and the difference between the maintenance= "automatic" meds and the prns using an action plan format for the latter (If this problem/symptom => do that organization reading Left to right).  Please see AVS from this visit for a full list of these instructions which I personally wrote for this pt and  are unique to this visit.

## 2019-05-14 NOTE — Assessment & Plan Note (Signed)
Onset in childhood  - Allergy profile 05/14/2019 >  Eos 0. /  IgE   - 05/14/2019  After extensive coaching inhaler device,  effectiveness =   90% > changed flovent to dulera 100 2bid and d/c'd inderal  The response to nebulizers and Flovent is convincing to me that she does have asthma and it is interesting that she is lived with it this long but now on Inderal appears she  has more frequent  refractory spells.  I discussed the goals of asthma therapy with her in detail which includes minimization of the need for albuterol in all forms, especially nebulized, as well as any need for systemic steroids when topical steroid should do for what amounts to a "rash in the airway" (using the analogy of dermatitis to demonstrate that putting a cream on her rash is really no different than using the proper ISC/aerosol for the airway as long as that she is properly.)  It may well turn out that she does not need Dulera consistently and can use it for flares but for now I would like to her to give it a fair try at a dose of 2 puffs every 12 hours to see if this time she gets 100% improvement and minimum need for albuterol before or after exercise and then might be possible to reduce or titrate the [facility] down Based on two studies from NEJM  378; 20 p 1865 (2018) and 380 : p2020-30 (2019) in pts with mild asthma it is reasonable to use low dose symbicort eg 80 (and by inference dulera 100) 2bid "prn" flare in this setting but I emphasized this was only shown with symbicort and takes advantage of the rapid onset of action but is not the same as "rescue therapy" but can be stopped once the acute symptoms have resolved and the need for rescue has been minimized (< 2 x weekly)     >> f/u in 6 weeks, sooner if needed with all meds in hand using a trust but verify approach to confirm accurate Medication  Reconciliation The principal here is that until we are certain that the  patients are doing what we've asked, it makes no  sense to ask them to do more.

## 2019-05-14 NOTE — Patient Instructions (Addendum)
Please remember to go to the lab department   for your tests - we will call you with the results when they are available.     Stop flovent and inderal  Start bisoprolol 5 mg daily   Plan A = Automatic = Dulera 100 1-2 puffs every 12 hours   Plan B = Backup Only use your albuterol inhaler as a rescue medication to be used if you can't catch your breath by resting or doing a relaxed purse lip breathing pattern.  - The less you use it, the better it will work when you need it. - Ok to use the inhaler up to 2 puffs  every 4 hours if you must but call for appointment if use goes up over your usual need - Don't leave home without it !!  (think of it like the spare tire for your car)    Please schedule a follow up office visit in 6 weeks, call sooner if needed with all medications /inhalers/ solutions in hand so we can verify exactly what you are taking. This includes all medications from all doctors and over the counters

## 2019-05-14 NOTE — Progress Notes (Signed)
Joanne BentonMary K Molina, female    DOB: 1980/01/29,    MRN: [id number]   Brief patient profile:  [age] yowf never smoker with "lifelong" recurrent cough once or twice a year esp in school and in cold weather  pattern then around 2017 pattern changed to cough and sob esp at hs assoc with subjective wheeze which did not really resolve between flares = x tol poor with cough and doe > to UC repeatedly rx  with neb and "all better for a day or two" pattern continued s maint rx until fall 2019 assoc with nasal congestion about 80% improved since since  maintained on Flovent 110  So referred to pulmonary clinic 05/14/2019 by Shirline Freesory Nafziger      History of Present Illness  05/14/2019  Pulmonary/ 1st office eval/Paxon Propes on inderal 20 mg hs  Chief Complaint  Patient presents with  . Pulmonary Consult    Referred by Shirline Freesory Nafziger, NP. Pt c/o cough and wheezing- occurs in the late summer through the winter.   Dyspnea:  Still some doe variable chest tight / wheeze  Cough: mostly dry assoc with nasal congestion  Sleep: better now on flovent  SABA use: last used 3 d prior to OV  / increase need in cold weather     No obvious day to day or daytime variability or assoc excess/ purulent sputum or mucus plugs or hemoptysis or cp or chest tightness, or overt   hb symptoms.   Sleeping now  without nocturnal  or early am exacerbation  of respiratory  c/o's or need for noct saba. Also denies any obvious fluctuation of symptoms with weather or environmental changes or other aggravating or alleviating factors except as outlined above   No unusual exposure hx or  knowledge of premature birth.  Current Allergies, Complete Past Medical History, Past Surgical History, Family History, and Social History were reviewed in [facility] electronic medical record.  ROS  The following are not active complaints unless bolded Hoarseness, sore throat, dysphagia, dental problems, itching, sneezing,  nasal congestion or discharge of excess  mucus or purulent secretions, ear ache,   fever, chills, sweats, unintended wt loss or wt gain, classically pleuritic or exertional cp,  orthopnea pnd or arm/hand swelling  or leg swelling, presyncope, palpitations, abdominal pain, anorexia, nausea, vomiting, diarrhea  or change in bowel habits or change in bladder habits, change in stools or change in urine, dysuria, hematuria,  rash, arthralgias, visual complaints, headache, numbness, weakness or ataxia or problems with walking or coordination,  change in mood or  memory.              Past Medical History:  Diagnosis Date  . Anxiety    panic attack  . Depression   . Migraine     Outpatient Medications Prior to Visit  Medication Sig Dispense Refill  . albuterol (PROVENTIL HFA;VENTOLIN HFA) 108 (90 Base) MCG/ACT inhaler Inhale 1 puff into the lungs every 6 (six) hours as needed for wheezing or shortness of breath.    . ALPRAZolam (XANAX) 1 MG tablet Take 1 tablet (1 mg total) by mouth 2 (two) times daily as needed for anxiety. 60 tablet 5  . Eszopiclone 3 MG TABS TAKE 1 TABLET BY MOUTH AT BEDTIME. TAKE IMMEDIATELY BEFORE BEDTIME 30 tablet 2  . fluticasone (FLONASE) 50 MCG/ACT nasal spray Place 1 spray into both nostrils daily.    . fluticasone (FLOVENT HFA) 110 MCG/ACT inhaler Inhale 1 puff into the lungs 2 (two) times daily.  1 Inhaler 1  . loratadine (CLARITIN) 10 MG tablet Take 10 mg by mouth daily as needed for allergies.    . pantoprazole (PROTONIX) 40 MG tablet Take 40 mg by mouth daily.  1  . propranolol (INDERAL) 20 MG tablet Take 1 tablet (20 mg total) by mouth at bedtime. 90 tablet 1  . sertraline (ZOLOFT) 100 MG tablet Take 2 tablets (200 mg total) by mouth daily. 180 tablet 1  . UNABLE TO FIND Med Name:Clarivis 20 mg daily    . cephALEXin (KEFLEX) 500 MG capsule Take 1 capsule (500 mg total) by mouth 2 (two) times daily. 14 capsule 0         Objective:     BP 120/84 (BP Location: Left Arm, Cuff Size: Normal)   Pulse 80    Temp 98.2 F (36.8 C) (Oral)   Ht 5\' 7"  (1.702 m)   Wt 169 lb (76.7 kg)   SpO2 99%   BMI 26.47 kg/m   SpO2: 99 %  RA  HEENT: nl dentition, turbinates bilaterally, and oropharynx. Nl external ear canals without cough reflex   NECK :  without JVD/Nodes/TM/ nl carotid upstrokes bilaterally   LUNGS: no acc muscle use,  Nl contour chest which is clear to A and P bilaterally without cough on insp or exp maneuvers   CV:  RRR  no s3 or murmur or increase in P2, and no edema   ABD:  soft and nontender with nl inspiratory excursion in the supine position. No bruits or organomegaly appreciated, bowel sounds nl  MS:  Nl gait/ ext warm without deformities, calf tenderness, cyanosis or clubbing No obvious joint restrictions   SKIN: warm and dry without lesions    NEURO:  alert, approp, nl sensorium with  no motor or cerebellar deficits apparent.    I personally reviewed images and agree with radiology impression as follows:  CXR:   12/28/18 No active cardiopulmonary disease    Assessment   Cough variant asthma Onset in childhood  - Allergy profile 05/14/2019 >  Eos 0. /  IgE   - 05/14/2019  After extensive coaching inhaler device,  effectiveness =   90% > changed flovent to dulera 100 2bid and d/c'd inderal  The response to nebulizers and Flovent is convincing to me that she does have asthma and it is interesting that she is lived with it this long but now on Inderal appears she  has more frequent  refractory spells.  I discussed the goals of asthma therapy with her in detail which includes minimization of the need for albuterol in all forms, especially nebulized, as well as any need for systemic steroids when topical steroid should do for what amounts to a "rash in the airway" (using the analogy of dermatitis to demonstrate that putting a cream on her rash is really no different than using the proper ISC/aerosol for the airway as long as that she is properly.)  It may well turn out  that she does not need Dulera consistently and can use it for flares but for now I would like to her to give it a fair try at a dose of 2 puffs every 12 hours to see if this time she gets 100% improvement and minimum need for albuterol before or after exercise and then might be possible to reduce or titrate the [facility] down Based on two studies from NEJM  378; 20 p 1865 (2018) and 380 : p2020-30 (2019) in pts with mild asthma it  is reasonable to use low dose symbicort eg 80 (and by inference dulera 100) 2bid "prn" flare in this setting but I emphasized this was only shown with symbicort and takes advantage of the rapid onset of action but is not the same as "rescue therapy" but can be stopped once the acute symptoms have resolved and the need for rescue has been minimized (< 2 x weekly)     >> f/u in 6 weeks, sooner if needed with all meds in hand using a trust but verify approach to confirm accurate Medication  Reconciliation The principal here is that until we are certain that the  patients are doing what we've asked, it makes no sense to ask them to do more.      Essential hypertension Try change inderol to bisoprol 5 mg daily 05/14/2019   In the setting of respiratory symptoms of unknown etiology,  It would be preferable to use bystolic, the most beta -1  selective Beta blocker available in sample form, with bisoprolol the most selective generic choice  on the market, at least on a trial basis, to make sure the spillover Beta 2 effects of the less specific Beta blockers are not contributing to this patient's symptoms.       Total time devoted to counseling  > 50 % of initial 60 min office visit:  review case with pt/  device teaching which extended face to face time for this visit discussion of options/alternatives/ personally creating written customized instructions  in presence of pt  then going over those specific  Instructions directly with the pt including how to use all of the meds but in  particular covering each new medication in detail and the difference between the maintenance= "automatic" meds and the prns using an action plan format for the latter (If this problem/symptom => do that organization reading Left to right).  Please see AVS from this visit for a full list of these instructions which I personally wrote for this pt and  are unique to this visit.   Sandrea Hughs, MD 05/14/2019

## 2019-05-15 ENCOUNTER — Telehealth: Payer: Self-pay | Admitting: Physician Assistant

## 2019-05-15 LAB — CBC WITH DIFFERENTIAL/PLATELET
Basophils Absolute: 0.1 10*3/uL (ref 0.0–0.1)
Basophils Relative: 1.3 % (ref 0.0–3.0)
Eosinophils Absolute: 0.2 10*3/uL (ref 0.0–0.7)
Eosinophils Relative: 2 % (ref 0.0–5.0)
HCT: 39.6 % (ref 36.0–46.0)
Hemoglobin: 13.6 g/dL (ref 12.0–15.0)
Lymphocytes Relative: 20.5 % (ref 12.0–46.0)
Lymphs Abs: 1.6 10*3/uL (ref 0.7–4.0)
MCHC: 34.2 g/dL (ref 30.0–36.0)
MCV: 88.8 fl (ref 78.0–100.0)
Monocytes Absolute: 0.3 10*3/uL (ref 0.1–1.0)
Monocytes Relative: 4 % (ref 3.0–12.0)
Neutro Abs: 5.7 10*3/uL (ref 1.4–7.7)
Neutrophils Relative %: 72.2 % (ref 43.0–77.0)
Platelets: 273 10*3/uL (ref 150.0–400.0)
RBC: 4.46 Mil/uL (ref 3.87–5.11)
RDW: 15 % (ref 11.5–15.5)
WBC: 7.9 10*3/uL (ref 4.0–10.5)

## 2019-05-15 NOTE — Telephone Encounter (Signed)
Patient was at the pulmongist yesterday and she has been diagnoisised with asthma and the dr took her off of the 20 mg propranolol and put her on espirolol 5 mg. Just wanted you to know about the med change [phone]

## 2019-05-15 NOTE — Telephone Encounter (Signed)
ok 

## 2019-05-18 NOTE — Progress Notes (Signed)
Spoke with pt and notified of results per Dr. Wert. Pt verbalized understanding and denied any questions. 

## 2019-05-20 LAB — RESPIRATORY ALLERGY PROFILE REGION II ~~LOC~~
Allergen, A. alternata, m6: <0.1 kU/L
Allergen, Cedar tree, t12: <0.1 kU/L
Allergen, Comm Silver Birch, t9: <0.1 kU/L
Allergen, Cottonwood, t14: <0.1 kU/L
Allergen, D pternoyssinus,d7: 0.15 kU/L — ABNORMAL HIGH
Allergen, Mouse Urine Protein, e78: 0.13 kU/L — ABNORMAL HIGH
Allergen, Mulberry, t76: <0.1 kU/L
Allergen, Oak,t7: <0.1 kU/L
Allergen, P. notatum, m1: <0.1 kU/L
Aspergillus fumigatus, m3: <0.1 kU/L
Bermuda Grass: 0.95 kU/L — ABNORMAL HIGH
Box Elder IgE: <0.1 kU/L
CLADOSPORIUM HERBARUM (M2) IGE: <0.1 kU/L
COMMON RAGWEED (SHORT) (W1) IGE: <0.1 kU/L
Cat Dander: 0.2 kU/L — ABNORMAL HIGH
Class: 0
Class: 0
Class: 0
Class: 0
Class: 0
Class: 0
Class: 0
Class: 0
Class: 0
Class: 0
Class: 0
Class: 0
Class: 0
Class: 0
Class: 0
Class: 0
Class: 0
Class: 0
Class: 0
Class: 1
Class: 2
Class: 2
Class: 2
Class: 2
Cockroach: 0.12 kU/L — ABNORMAL HIGH
D. farinae: 0.36 kU/L — ABNORMAL HIGH
Dog Dander: 1.49 kU/L — ABNORMAL HIGH
Elm IgE: <0.1 kU/L
IgE (Immunoglobulin E), Serum: 36 kU/L (ref <=114)
Johnson Grass: 1.24 kU/L — ABNORMAL HIGH
Pecan/Hickory Tree IgE: <0.1 kU/L
Rough Pigweed  IgE: <0.1 kU/L
Sheep Sorrel IgE: <0.1 kU/L
Timothy Grass: 1.71 kU/L — ABNORMAL HIGH

## 2019-05-20 LAB — ALPHA-1 ANTITRYPSIN PHENOTYPE: A-1 Antitrypsin, Ser: 138 mg/dL (ref 83–199)

## 2019-05-20 LAB — INTERPRETATION:

## 2019-06-13 ENCOUNTER — Telehealth: Payer: BLUE CROSS/BLUE SHIELD | Admitting: Physician Assistant

## 2019-06-13 DIAGNOSIS — B9689 Other specified bacterial agents as the cause of diseases classified elsewhere: Secondary | ICD-10-CM

## 2019-06-13 DIAGNOSIS — J019 Acute sinusitis, unspecified: Secondary | ICD-10-CM | POA: Diagnosis not present

## 2019-06-13 MED ORDER — AMOXICILLIN-POT CLAVULANATE 875-125 MG PO TABS: 1.0000 | ORAL_TABLET | Freq: Two times a day (BID) | ORAL | 0 refills | Status: DC

## 2019-06-13 NOTE — Progress Notes (Signed)
I have spent 5 minutes in review of e-visit questionnaire, review and updating patient chart, medical decision making and response to patient.   Deantae Shackleton Cody Kohei Antonellis, PA-C    

## 2019-06-13 NOTE — Progress Notes (Signed)

## 2019-06-16 NOTE — H&P (Signed)
Joanne Molina is an [age] y.89.G3P2012 female with history of painful, heavy and irregular menses following last delivery in dec 2017.  Pt has had bouts of bronchitis and asthma requiring prolonged use of  steroids and antibiotics and bleeding issues intensified during treatment at these times. Pt has tried provera and lysteda in the past.  Pt had a normal SIUS in feb 2020.  Pt has been counseled to various medical and surgical treatment options and has opted for total laparoscopic hysterectomy with bilateral salpingectomy, cystoscopy and possible open   Pertinent Gynecological History: Menses: flow is excessive with use of 8-10 pads or tampons on heaviest days Bleeding: dysfunctional uterine bleeding Contraception: none DES exposure: denies Blood transfusions: none Sexually transmitted diseases: no past history Previous GYN Procedures: none  Last mammogram: n/a Date:  Last pap: normal Date: 06/2015 OB History: G3, P2012   Menstrual History: Menarche age: [age] No LMP recorded. (Menstrual status: Irregular Periods).    Past Medical History:  Diagnosis Date  . Anxiety    panic attack  . Cough variant asthma 05/14/2019  . Depression   . Migraine     Past Surgical History:  Procedure Laterality Date  . CHOLECYSTECTOMY  10/02/11    Family History  Problem Relation Age of Onset  . Diabetes Mother   . Hyperlipidemia Mother   . Heart disease Mother   . Asthma Mother   . Allergies Mother   . Stomach cancer Mother   . Diabetes Father   . Hyperlipidemia Father   . Heart disease Father   . Cancer Maternal Grandmother        colon and breast  . Cancer Paternal Grandmother        colon    Social History:  reports that she has never smoked. She has never used smokeless tobacco. She reports that she does not drink alcohol or use drugs.  Allergies:  Allergies  Allergen Reactions  . Erythromycin Nausea Only and Other (See Comments)    REACTION: Upset GI    No medications prior to  admission.    Review of Systems  Constitutional: Positive for malaise/fatigue. Negative for chills, fever and weight loss.  Eyes: Negative for blurred vision and double vision.  Respiratory: Positive for shortness of breath.   Cardiovascular: Positive for palpitations.  Gastrointestinal: Positive for abdominal pain. Negative for heartburn, nausea and vomiting.  Genitourinary: Negative for dysuria.  Musculoskeletal: Positive for myalgias.  Skin: Negative for itching and rash.  Neurological: Negative for dizziness and headaches.  Endo/Heme/Allergies: Negative for environmental allergies. Does not bruise/bleed easily.  Psychiatric/Behavioral: Negative for depression, hallucinations, substance abuse and suicidal ideas. The patient is nervous/anxious.     unknown if currently breastfeeding. Physical Exam  Constitutional: [name] oriented to person, place, and time. She appears well-developed and well-nourished.  Neck: Normal range of motion.  Cardiovascular: Normal rate.  Respiratory: Effort normal.  GI: Soft.  Genitourinary:    Vagina and uterus normal.   Musculoskeletal: Normal range of motion.  Neurological: [name] alert and oriented to person, place, and time.  Skin: Skin is warm.  Psychiatric: She has a normal mood and affect. Her behavior is normal. Judgment and thought content normal.    No results found for this or any previous visit (from the past 24 hour(s)).  No results found.  Assessment/Plan: 54UJ [age] G3P2012 female with menorrhagia, dysmenorrhea and irregular menses for Total laparoscopic hysterectomy with bilateral salpingectomy, cystoscopy and possible open  Procedure with possible risks and benefits reviewed  Consent verified and all questions answered To OR when ready  Isaiah Serge 06/16/2019, 3:03 AM

## 2019-06-18 DIAGNOSIS — N920 Excessive and frequent menstruation with regular cycle: Secondary | ICD-10-CM | POA: Diagnosis not present

## 2019-06-18 DIAGNOSIS — N945 Secondary dysmenorrhea: Secondary | ICD-10-CM | POA: Diagnosis not present

## 2019-06-18 DIAGNOSIS — Z01818 Encounter for other preprocedural examination: Secondary | ICD-10-CM | POA: Diagnosis not present

## 2019-06-22 ENCOUNTER — Ambulatory Visit: Payer: BLUE CROSS/BLUE SHIELD | Admitting: Internal Medicine

## 2019-06-23 NOTE — Progress Notes (Signed)
Spoke w/ pt via phone made covid test appt @ 1405 on 06-25-2019 before her PAT appt @ 1500 @ [facility].

## 2019-06-24 NOTE — Patient Instructions (Addendum)
Your procedure is scheduled on   Monday 06/29/2019  YOUR COVID-19 TEST HAS BEEN COMPLETED, SO PLEASE BEGIN THE QUARANTINE INSTRUCTIONS AS INDICATED IN YOUR HANDOUT    Report to [facility]. M.   Call this number if you have problems the morning of surgery  [phone].   OUR ADDRESS IS [address].  WE ARE LOCATED IN [facility].                                     REMEMBER:  DO NOT EAT FOOD OR DRINK LIQUIDS AFTER MIDNIGHT .    TAKE THESE MEDICATIONS MORNING OF SURGERY WITH A SIP OF WATER: , Sertraline (Zoloft), Pantoprazole (Protonix), use Dulera inhaler and Albuterol inhalers if needed, and bring inhalers with you to the hospital.  IF YOU ARE SPENDING THE NIGHT AFTER SURGERY PLEASE BRING ALL YOUR PRESCRIPTION MEDICATIONS IN THEIR ORIGINAL BOTTLES.                                    DO NOT WEAR JEWERLY, MAKE UP, OR NAIL POLISH,  DO NOT WEAR LOTIONS, POWDERS, PERFUMES OR DEODORANT. DO NOT SHAVE FOR 24 HOURS PRIOR TO DAY OF SURGERY.  CONTACTS, GLASSES, OR DENTURES MAY NOT BE WORN TO SURGERY.                                    [facility] IS NOT RESPONSIBLE  FOR ANY BELONGINGS.                                                                    Marland Kitchen                                                                         [facility] - Preparing for Surgery Before surgery, you can play an important role.  Because skin is not sterile, your skin needs to be as free of germs as possible.  You can reduce the number of germs on your skin by washing with CHG (chlorahexidine gluconate) soap before surgery.  CHG is an antiseptic cleaner which kills germs and bonds with the skin to continue killing germs even after washing. Please DO NOT use if you have an allergy to CHG or antibacterial soaps.  If your skin becomes reddened/irritated stop using the CHG and inform your nurse when you arrive at Short Stay. Do not shave (including legs and  underarms) for at least 48 hours prior to the first CHG shower.  You may shave your face/neck. Please follow these instructions carefully:  1.  Shower with CHG Soap the night before surgery and the  morning of Surgery.  2.  If you choose to wash your hair,  wash your hair first as usual with your  normal  shampoo.  3.  After you shampoo, rinse your hair and body thoroughly to remove the  shampoo.                           4.  Use CHG as you would any other liquid soap.  You can apply chg directly  to the skin and wash                       Gently with a scrungie or clean washcloth.  5.  Apply the CHG Soap to your body ONLY FROM THE NECK DOWN.   Do not use on face/ open                           Wound or open sores. Avoid contact with eyes, ears mouth and genitals (private parts).                       Wash face,  Genitals (private parts) with your normal soap.             6.  Wash thoroughly, paying special attention to the area where your surgery  will be performed.  7.  Thoroughly rinse your body with warm water from the neck down.  8.  DO NOT shower/wash with your normal soap after using and rinsing off  the CHG Soap.                9.  Pat yourself dry with a clean towel.            10.  Wear clean pajamas.            11.  Place clean sheets on your bed the night of your first shower and do not  sleep with pets. Day of Surgery : Do not apply any lotions/deodorants the morning of surgery.  Please wear clean clothes to the hospital/surgery center.  FAILURE TO FOLLOW THESE INSTRUCTIONS MAY RESULT IN THE CANCELLATION OF YOUR SURGERY PATIENT SIGNATURE_________________________________  NURSE SIGNATURE__________________________________  ________________________________________________________________________   Rogelia MireIncentive Spirometer  An incentive spirometer is a tool that can help keep your lungs clear and active. This tool measures how well you are filling your lungs with each breath. Taking  long deep breaths may help reverse or decrease the chance of developing breathing (pulmonary) problems (especially infection) following:  A long period of time when you are unable to move or be active. BEFORE THE PROCEDURE   If the spirometer includes an indicator to show your best effort, your nurse or respiratory therapist will set it to a desired goal.  If possible, sit up straight or lean slightly forward. Try not to slouch.  Hold the incentive spirometer in an upright position. INSTRUCTIONS FOR USE  1. Sit on the edge of your bed if possible, or sit up as far as you can in bed or on a chair. 2. Hold the incentive spirometer in an upright position. 3. Breathe out normally. 4. Place the mouthpiece in your mouth and seal your lips tightly around it. 5. Breathe in slowly and as deeply as possible, raising the piston or the ball toward the top of the column. 6. Hold your breath for 3-5 seconds or for as long as possible. Allow the piston or ball to fall to the bottom of the column.  7. Remove the mouthpiece from your mouth and breathe out normally. 8. Rest for a few seconds and repeat Steps 1 through 7 at least 10 times every 1-2 hours when you are awake. Take your time and take a few normal breaths between deep breaths. 9. The spirometer may include an indicator to show your best effort. Use the indicator as a goal to work toward during each repetition. 10. After each set of 10 deep breaths, practice coughing to be sure your lungs are clear. If you have an incision (the cut made at the time of surgery), support your incision when coughing by placing a pillow or rolled up towels firmly against it. Once you are able to get out of bed, walk around indoors and cough well. You may stop using the incentive spirometer when instructed by your caregiver.  RISKS AND COMPLICATIONS  Take your time so you do not get dizzy or light-headed.  If you are in pain, you may need to take or ask for pain  medication before doing incentive spirometry. It is harder to take a deep breath if you are having pain. AFTER USE  Rest and breathe slowly and easily.  It can be helpful to keep track of a log of your progress. Your caregiver can provide you with a simple table to help with this. If you are using the spirometer at home, follow these instructions: SEEK MEDICAL CARE IF:   You are having difficultly using the spirometer.  You have trouble using the spirometer as often as instructed.  Your pain medication is not giving enough relief while using the spirometer.  You develop fever of 100.5 F (38.1 C) or higher. SEEK IMMEDIATE MEDICAL CARE IF:   You cough up bloody sputum that had not been present before.  You develop fever of 102 F (38.9 C) or greater.  You develop worsening pain at or near the incision site. MAKE SURE YOU:   Understand these instructions.  Will watch your condition.  Will get help right away if you are not doing well or get worse. Document Released: 04/29/2007 Document Revised: 03/10/2012 Document Reviewed: 06/30/2007 ExitCare Patient Information 2014 [employer], [place].   ________________________________________________________________________  WHAT IS A BLOOD TRANSFUSION? Blood Transfusion Information  A transfusion is the replacement of blood or some of its parts. Blood is made up of multiple cells which provide different functions.  Red blood cells carry oxygen and are used for blood loss replacement.  White blood cells fight against infection.  Platelets control bleeding.  Plasma helps clot blood.  Other blood products are available for specialized needs, such as hemophilia or other clotting disorders. BEFORE THE TRANSFUSION  Who gives blood for transfusions?   Healthy volunteers who are fully evaluated to make sure their blood is safe. This is blood bank blood. Transfusion therapy is the safest it has ever been in the practice of medicine.  Before blood is taken from a donor, a complete history is taken to make sure that person has no history of diseases nor engages in risky social behavior (examples are intravenous drug use or sexual activity with multiple partners). The donor's travel history is screened to minimize risk of transmitting infections, such as malaria. The donated blood is tested for signs of infectious diseases, such as HIV and hepatitis. The blood is then tested to be sure it is compatible with you in order to minimize the chance of a transfusion reaction. If you or a relative donates blood, this is often done in  anticipation of surgery and is not appropriate for emergency situations. It takes many days to process the donated blood. RISKS AND COMPLICATIONS Although transfusion therapy is very safe and saves many lives, the main dangers of transfusion include:   Getting an infectious disease.  Developing a transfusion reaction. This is an allergic reaction to something in the blood you were given. Every precaution is taken to prevent this. The decision to have a blood transfusion has been considered carefully by your caregiver before blood is given. Blood is not given unless the benefits outweigh the risks. AFTER THE TRANSFUSION  Right after receiving a blood transfusion, you will usually feel much better and more energetic. This is especially true if your red blood cells have gotten low (anemic). The transfusion raises the level of the red blood cells which carry oxygen, and this usually causes an energy increase.  The nurse administering the transfusion will monitor you carefully for complications. HOME CARE INSTRUCTIONS  No special instructions are needed after a transfusion. You may find your energy is better. Speak with your caregiver about any limitations on activity for underlying diseases you may have. SEEK MEDICAL CARE IF:   Your condition is not improving after your transfusion.  You develop redness or  irritation at the intravenous (IV) site. SEEK IMMEDIATE MEDICAL CARE IF:  Any of the following symptoms occur over the next 12 hours:  Shaking chills.  You have a temperature by mouth above 102 F (38.9 C), not controlled by medicine.  Chest, back, or muscle pain.  People around you feel you are not acting correctly or are confused.  Shortness of breath or difficulty breathing.  Dizziness and fainting.  You get a rash or develop hives.  You have a decrease in urine output.  Your urine turns a dark color or changes to pink, red, or brown. Any of the following symptoms occur over the next 10 days:  You have a temperature by mouth above 102 F (38.9 C), not controlled by medicine.  Shortness of breath.  Weakness after normal activity.  The white part of the eye turns yellow (jaundice).  You have a decrease in the amount of urine or are urinating less often.  Your urine turns a dark color or changes to pink, red, or brown. Document Released: 12/14/2000 Document Revised: 03/10/2012 Document Reviewed: 08/02/2008 [facility] Patient Information 2014 [place], [place].  _______________________________________________________________________

## 2019-06-24 NOTE — Progress Notes (Signed)
01/02/2019- noted in [place]  11/26/2018- noted in [place]- CXR 2 view

## 2019-06-25 ENCOUNTER — Other Ambulatory Visit (HOSPITAL_COMMUNITY)
Admission: RE | Admit: 2019-06-25 | Discharge: 2019-06-25 | Disposition: A | Discharge status: Home / Self Care | Payer: BC Managed Care – PPO | Source: Ambulatory Visit

## 2019-06-25 ENCOUNTER — Encounter (HOSPITAL_COMMUNITY): Payer: Self-pay

## 2019-06-25 ENCOUNTER — Encounter (HOSPITAL_COMMUNITY)
Admission: RE | Admit: 2019-06-25 | Discharge: 2019-06-25 | Disposition: A | Discharge status: Home / Self Care | Payer: BC Managed Care – PPO | Source: Ambulatory Visit | Attending: Obstetrics and Gynecology | Admitting: Obstetrics and Gynecology

## 2019-06-25 ENCOUNTER — Other Ambulatory Visit: Payer: Self-pay

## 2019-06-25 DIAGNOSIS — Z1159 Encounter for screening for other viral diseases: Secondary | ICD-10-CM | POA: Insufficient documentation

## 2019-06-25 DIAGNOSIS — Z01812 Encounter for preprocedural laboratory examination: Secondary | ICD-10-CM | POA: Insufficient documentation

## 2019-06-25 LAB — CBC
HCT: 40.3 % (ref 36.0–46.0)
Hemoglobin: 13.3 g/dL (ref 12.0–15.0)
MCH: 30.8 pg (ref 26.0–34.0)
MCHC: 33 g/dL (ref 30.0–36.0)
MCV: 93.3 fL (ref 80.0–100.0)
Platelets: 234 10*3/uL (ref 150–400)
RBC: 4.32 MIL/uL (ref 3.87–5.11)
RDW: 13.1 % (ref 11.5–15.5)
WBC: 7.2 10*3/uL (ref 4.0–10.5)
nRBC: 0 % (ref 0.0–0.2)

## 2019-06-25 LAB — BASIC METABOLIC PANEL
Anion gap: 5 (ref 5–15)
BUN: 16 mg/dL (ref 6–20)
CO2: 26 mmol/L (ref 22–32)
Calcium: 8.9 mg/dL (ref 8.9–10.3)
Chloride: 109 mmol/L (ref 98–111)
Creatinine, Ser: 0.97 mg/dL (ref 0.44–1.00)
GFR calc Af Amer: >60 mL/min (ref >60)
GFR calc non Af Amer: >60 mL/min (ref >60)
Glucose, Bld: 111 mg/dL — ABNORMAL HIGH (ref 70–99)
Potassium: 4.4 mmol/L (ref 3.5–5.1)
Sodium: 140 mmol/L (ref 135–145)

## 2019-06-25 LAB — SARS CORONAVIRUS 2 (TAT 6-24 HRS): SARS Coronavirus 2: NEGATIVE

## 2019-06-26 ENCOUNTER — Ambulatory Visit: Payer: BLUE CROSS/BLUE SHIELD | Admitting: Internal Medicine

## 2019-06-26 LAB — ABO/RH: ABO/RH(D): O NEG

## 2019-06-28 NOTE — Anesthesia Preprocedure Evaluation (Addendum)
Anesthesia Evaluation  Patient identified by MRN, date of birth, ID band Patient awake    Reviewed: Allergy & Precautions, H&P , NPO status , Patient's Chart, lab work & pertinent test results  Airway Mallampati: I  TM Distance: >3 FB Neck ROM: full    Dental no notable dental hx. (+) Teeth Intact, Dental Advisory Given   Pulmonary asthma ,    Pulmonary exam normal        Cardiovascular negative cardio ROS Normal cardiovascular exam     Neuro/Psych  Headaches, PSYCHIATRIC DISORDERS Anxiety Depression    GI/Hepatic negative GI ROS, Neg liver ROS,   Endo/Other  negative endocrine ROS  Renal/GU negative Renal ROS     Musculoskeletal negative musculoskeletal ROS (+)   Abdominal Normal abdominal exam  (+)   Peds  Hematology negative hematology ROS (+)   Anesthesia Other Findings   Reproductive/Obstetrics negative OB ROS                           Anesthesia Physical  Anesthesia Plan  ASA: II  Anesthesia Plan: General   Post-op Pain Management:    Induction: Intravenous  PONV Risk Score and Plan: 3 and Ondansetron, Treatment may vary due to age or medical condition, Midazolam and Dexamethasone  Airway Management Planned: Oral ETT and LMA  Additional Equipment:   Intra-op Plan:   Post-operative Plan: Extubation in OR  Informed Consent: I have reviewed the patients History and Physical, chart, labs and discussed the procedure including the risks, benefits and alternatives for the proposed anesthesia with the patient or authorized representative who has indicated his/her understanding and acceptance.       Plan Discussed with: CRNA, Anesthesiologist and Surgeon  Anesthesia Plan Comments: (  )        Anesthesia Quick Evaluation

## 2019-06-29 ENCOUNTER — Encounter (HOSPITAL_BASED_OUTPATIENT_CLINIC_OR_DEPARTMENT_OTHER): Payer: Self-pay | Admitting: Certified Registered"

## 2019-06-29 ENCOUNTER — Ambulatory Visit (HOSPITAL_BASED_OUTPATIENT_CLINIC_OR_DEPARTMENT_OTHER): Payer: BC Managed Care – PPO | Admitting: Anesthesiology

## 2019-06-29 ENCOUNTER — Ambulatory Visit (HOSPITAL_BASED_OUTPATIENT_CLINIC_OR_DEPARTMENT_OTHER)
Admission: RE | Admit: 2019-06-29 | Discharge: 2019-06-30 | Disposition: A | Discharge status: Home / Self Care | Payer: BC Managed Care – PPO | Attending: Obstetrics and Gynecology | Admitting: Obstetrics and Gynecology

## 2019-06-29 ENCOUNTER — Ambulatory Visit (HOSPITAL_BASED_OUTPATIENT_CLINIC_OR_DEPARTMENT_OTHER): Payer: BC Managed Care – PPO | Admitting: Physician Assistant

## 2019-06-29 ENCOUNTER — Encounter (HOSPITAL_BASED_OUTPATIENT_CLINIC_OR_DEPARTMENT_OTHER)
Admission: RE | Disposition: A | Discharge status: Home / Self Care | Payer: Self-pay | Source: Home / Self Care | Attending: Obstetrics and Gynecology

## 2019-06-29 ENCOUNTER — Other Ambulatory Visit: Payer: Self-pay

## 2019-06-29 DIAGNOSIS — J45998 Other asthma: Secondary | ICD-10-CM | POA: Insufficient documentation

## 2019-06-29 DIAGNOSIS — N945 Secondary dysmenorrhea: Secondary | ICD-10-CM | POA: Diagnosis not present

## 2019-06-29 DIAGNOSIS — N841 Polyp of cervix uteri: Secondary | ICD-10-CM | POA: Diagnosis not present

## 2019-06-29 DIAGNOSIS — Z90710 Acquired absence of both cervix and uterus: Secondary | ICD-10-CM

## 2019-06-29 DIAGNOSIS — Z881 Allergy status to other antibiotic agents status: Secondary | ICD-10-CM | POA: Diagnosis not present

## 2019-06-29 DIAGNOSIS — F329 Major depressive disorder, single episode, unspecified: Secondary | ICD-10-CM | POA: Diagnosis not present

## 2019-06-29 DIAGNOSIS — F419 Anxiety disorder, unspecified: Secondary | ICD-10-CM | POA: Diagnosis not present

## 2019-06-29 DIAGNOSIS — N888 Other specified noninflammatory disorders of cervix uteri: Secondary | ICD-10-CM | POA: Insufficient documentation

## 2019-06-29 DIAGNOSIS — G4700 Insomnia, unspecified: Secondary | ICD-10-CM | POA: Diagnosis not present

## 2019-06-29 DIAGNOSIS — N921 Excessive and frequent menstruation with irregular cycle: Secondary | ICD-10-CM | POA: Insufficient documentation

## 2019-06-29 DIAGNOSIS — N938 Other specified abnormal uterine and vaginal bleeding: Secondary | ICD-10-CM | POA: Insufficient documentation

## 2019-06-29 DIAGNOSIS — N72 Inflammatory disease of cervix uteri: Secondary | ICD-10-CM | POA: Insufficient documentation

## 2019-06-29 DIAGNOSIS — J45991 Cough variant asthma: Secondary | ICD-10-CM | POA: Diagnosis not present

## 2019-06-29 DIAGNOSIS — N920 Excessive and frequent menstruation with regular cycle: Secondary | ICD-10-CM | POA: Diagnosis present

## 2019-06-29 DIAGNOSIS — N946 Dysmenorrhea, unspecified: Secondary | ICD-10-CM | POA: Insufficient documentation

## 2019-06-29 HISTORY — PX: CYSTOSCOPY: SHX5120

## 2019-06-29 HISTORY — DX: Acquired absence of both cervix and uterus: Z90.710

## 2019-06-29 HISTORY — PX: TOTAL LAPAROSCOPIC HYSTERECTOMY WITH SALPINGECTOMY: SHX6742

## 2019-06-29 LAB — POCT PREGNANCY, URINE: Preg Test, Ur: NEGATIVE

## 2019-06-29 LAB — TYPE AND SCREEN
ABO/RH(D): O NEG
Antibody Screen: NEGATIVE

## 2019-06-29 SURGERY — HYSTERECTOMY, TOTAL, LAPAROSCOPIC, WITH SALPINGECTOMY
Anesthesia: General | Site: Uterus

## 2019-06-29 MED ORDER — ACETAMINOPHEN 325 MG PO TABS
650.0000 mg | ORAL_TABLET | ORAL | Status: DC | PRN
  Filled 2019-06-29: qty 2

## 2019-06-29 MED ORDER — SUGAMMADEX SODIUM 200 MG/2ML IV SOLN
INTRAVENOUS | Status: DC | PRN
  Administered 2019-06-29: 175 mg via INTRAVENOUS

## 2019-06-29 MED ORDER — SUGAMMADEX SODIUM 200 MG/2ML IV SOLN
INTRAVENOUS | Status: AC
  Filled 2019-06-29: qty 2

## 2019-06-29 MED ORDER — FENTANYL CITRATE (PF) 100 MCG/2ML IJ SOLN
INTRAMUSCULAR | Status: AC
  Filled 2019-06-29: qty 2

## 2019-06-29 MED ORDER — SCOPOLAMINE 1 MG/3DAYS TD PT72
MEDICATED_PATCH | TRANSDERMAL | Status: AC
  Filled 2019-06-29: qty 1

## 2019-06-29 MED ORDER — OXYCODONE-ACETAMINOPHEN 5-325 MG PO TABS
ORAL_TABLET | ORAL | Status: AC
  Filled 2019-06-29: qty 1

## 2019-06-29 MED ORDER — IBUPROFEN 800 MG PO TABS
800.0000 mg | ORAL_TABLET | Freq: Three times a day (TID) | ORAL | Status: DC
  Administered 2019-06-29 – 2019-06-30 (×2): 800 mg via ORAL
  Filled 2019-06-29: qty 1

## 2019-06-29 MED ORDER — OXYCODONE-ACETAMINOPHEN 5-325 MG PO TABS
1.0000 | ORAL_TABLET | ORAL | Status: DC | PRN
  Administered 2019-06-29 – 2019-06-30 (×3): 1 via ORAL
  Filled 2019-06-29: qty 2

## 2019-06-29 MED ORDER — PANTOPRAZOLE SODIUM 40 MG PO TBEC
40.0000 mg | DELAYED_RELEASE_TABLET | Freq: Every day | ORAL | Status: DC
  Filled 2019-06-29: qty 1

## 2019-06-29 MED ORDER — SERTRALINE HCL 100 MG PO TABS
200.0000 mg | ORAL_TABLET | Freq: Every day | ORAL | Status: DC
  Filled 2019-06-29: qty 2

## 2019-06-29 MED ORDER — OXYCODONE HCL 5 MG/5ML PO SOLN
5.0000 mg | Freq: Once | ORAL | Status: DC | PRN
  Filled 2019-06-29: qty 5

## 2019-06-29 MED ORDER — ONDANSETRON HCL 4 MG/2ML IJ SOLN
INTRAMUSCULAR | Status: DC | PRN
  Administered 2019-06-29: 4 mg via INTRAVENOUS

## 2019-06-29 MED ORDER — ACETAMINOPHEN 500 MG PO TABS
ORAL_TABLET | ORAL | Status: AC
  Filled 2019-06-29: qty 2

## 2019-06-29 MED ORDER — LIDOCAINE 2% (20 MG/ML) 5 ML SYRINGE
INTRAMUSCULAR | Status: DC | PRN
  Administered 2019-06-29: 100 mg via INTRAVENOUS

## 2019-06-29 MED ORDER — SIMETHICONE 80 MG PO CHEW
80.0000 mg | CHEWABLE_TABLET | Freq: Four times a day (QID) | ORAL | Status: DC | PRN
  Filled 2019-06-29: qty 1

## 2019-06-29 MED ORDER — ACETAMINOPHEN 325 MG PO TABS
325.0000 mg | ORAL_TABLET | ORAL | Status: DC | PRN
  Filled 2019-06-29: qty 2

## 2019-06-29 MED ORDER — ONDANSETRON HCL 4 MG/2ML IJ SOLN
4.0000 mg | Freq: Four times a day (QID) | INTRAMUSCULAR | Status: DC | PRN
  Filled 2019-06-29: qty 2

## 2019-06-29 MED ORDER — ONDANSETRON HCL 4 MG/2ML IJ SOLN
INTRAMUSCULAR | Status: AC
  Filled 2019-06-29: qty 2

## 2019-06-29 MED ORDER — ALPRAZOLAM 1 MG PO TABS
1.0000 mg | ORAL_TABLET | Freq: Two times a day (BID) | ORAL | Status: DC | PRN
  Filled 2019-06-29: qty 1

## 2019-06-29 MED ORDER — LACTATED RINGERS IV SOLN
INTRAVENOUS | Status: DC
  Administered 2019-06-29 (×2): via INTRAVENOUS
  Filled 2019-06-29: qty 1000

## 2019-06-29 MED ORDER — IBUPROFEN 200 MG PO TABS
ORAL_TABLET | ORAL | Status: AC
  Filled 2019-06-29: qty 4

## 2019-06-29 MED ORDER — SODIUM CHLORIDE 0.9 % IR SOLN
Status: DC | PRN
  Administered 2019-06-29: 1000 mL

## 2019-06-29 MED ORDER — DEXAMETHASONE SODIUM PHOSPHATE 10 MG/ML IJ SOLN
INTRAMUSCULAR | Status: DC | PRN
  Administered 2019-06-29: 10 mg via INTRAVENOUS

## 2019-06-29 MED ORDER — DEXAMETHASONE SODIUM PHOSPHATE 10 MG/ML IJ SOLN
INTRAMUSCULAR | Status: AC
  Filled 2019-06-29: qty 1

## 2019-06-29 MED ORDER — SENNOSIDES-DOCUSATE SODIUM 8.6-50 MG PO TABS
1.0000 | ORAL_TABLET | Freq: Every evening | ORAL | Status: DC | PRN
  Filled 2019-06-29: qty 1

## 2019-06-29 MED ORDER — BUPIVACAINE HCL (PF) 0.25 % IJ SOLN
INTRAMUSCULAR | Status: DC | PRN
  Administered 2019-06-29: 10 mL

## 2019-06-29 MED ORDER — MIDAZOLAM HCL 5 MG/5ML IJ SOLN
INTRAMUSCULAR | Status: DC | PRN
  Administered 2019-06-29: 2 mg via INTRAVENOUS

## 2019-06-29 MED ORDER — ACETAMINOPHEN 160 MG/5ML PO SOLN
325.0000 mg | ORAL | Status: DC | PRN
  Filled 2019-06-29: qty 20.3

## 2019-06-29 MED ORDER — CEFAZOLIN SODIUM-DEXTROSE 2-4 GM/100ML-% IV SOLN
2.0000 g | INTRAVENOUS | Status: AC
  Administered 2019-06-29: 2 g via INTRAVENOUS
  Filled 2019-06-29: qty 100

## 2019-06-29 MED ORDER — GABAPENTIN 100 MG PO CAPS
100.0000 mg | ORAL_CAPSULE | Freq: Two times a day (BID) | ORAL | Status: DC
  Administered 2019-06-29 (×2): 100 mg via ORAL
  Filled 2019-06-29 (×3): qty 1

## 2019-06-29 MED ORDER — KETOROLAC TROMETHAMINE 30 MG/ML IJ SOLN
INTRAMUSCULAR | Status: AC
  Filled 2019-06-29: qty 1

## 2019-06-29 MED ORDER — BUPIVACAINE HCL (PF) 0.25 % IJ SOLN
INTRAMUSCULAR | Status: AC
  Filled 2019-06-29: qty 30

## 2019-06-29 MED ORDER — FLUORESCEIN SODIUM 10 % IV SOLN
INTRAVENOUS | Status: DC | PRN
  Administered 2019-06-29: 100 mg via INTRAVENOUS

## 2019-06-29 MED ORDER — FENTANYL CITRATE (PF) 100 MCG/2ML IJ SOLN
INTRAMUSCULAR | Status: DC | PRN
  Administered 2019-06-29 (×2): 50 ug via INTRAVENOUS
  Administered 2019-06-29: 100 ug via INTRAVENOUS
  Administered 2019-06-29: 50 ug via INTRAVENOUS

## 2019-06-29 MED ORDER — MIDAZOLAM HCL 2 MG/2ML IJ SOLN
INTRAMUSCULAR | Status: AC
  Filled 2019-06-29: qty 2

## 2019-06-29 MED ORDER — FLUORESCEIN SODIUM 10 % IV SOLN
INTRAVENOUS | Status: AC
  Filled 2019-06-29: qty 5

## 2019-06-29 MED ORDER — LIDOCAINE 2% (20 MG/ML) 5 ML SYRINGE
INTRAMUSCULAR | Status: AC
  Filled 2019-06-29: qty 5

## 2019-06-29 MED ORDER — ONDANSETRON HCL 4 MG/2ML IJ SOLN
4.0000 mg | Freq: Once | INTRAMUSCULAR | Status: DC | PRN
  Filled 2019-06-29: qty 2

## 2019-06-29 MED ORDER — SCOPOLAMINE 1 MG/3DAYS TD PT72
1.0000 | MEDICATED_PATCH | TRANSDERMAL | Status: DC
  Administered 2019-06-29: 1.5 mg via TRANSDERMAL
  Filled 2019-06-29: qty 1

## 2019-06-29 MED ORDER — ROCURONIUM BROMIDE 10 MG/ML (PF) SYRINGE
PREFILLED_SYRINGE | INTRAVENOUS | Status: DC | PRN
  Administered 2019-06-29: 10 mg via INTRAVENOUS
  Administered 2019-06-29: 50 mg via INTRAVENOUS
  Administered 2019-06-29: 10 mg via INTRAVENOUS

## 2019-06-29 MED ORDER — OXYCODONE HCL 5 MG PO TABS
5.0000 mg | ORAL_TABLET | Freq: Once | ORAL | Status: DC | PRN
  Filled 2019-06-29: qty 1

## 2019-06-29 MED ORDER — PROPOFOL 10 MG/ML IV BOLUS
INTRAVENOUS | Status: AC
  Filled 2019-06-29: qty 20

## 2019-06-29 MED ORDER — ROCURONIUM BROMIDE 10 MG/ML (PF) SYRINGE
PREFILLED_SYRINGE | INTRAVENOUS | Status: AC
  Filled 2019-06-29: qty 10

## 2019-06-29 MED ORDER — ACETAMINOPHEN 500 MG PO TABS
1000.0000 mg | ORAL_TABLET | ORAL | Status: AC
  Administered 2019-06-29: 08:00:00 1000 mg via ORAL
  Filled 2019-06-29: qty 2

## 2019-06-29 MED ORDER — ONDANSETRON HCL 4 MG PO TABS
4.0000 mg | ORAL_TABLET | Freq: Four times a day (QID) | ORAL | Status: DC | PRN
  Filled 2019-06-29: qty 1

## 2019-06-29 MED ORDER — FENTANYL CITRATE (PF) 100 MCG/2ML IJ SOLN
25.0000 ug | INTRAMUSCULAR | Status: DC | PRN
  Administered 2019-06-29: 50 ug via INTRAVENOUS
  Filled 2019-06-29: qty 1

## 2019-06-29 MED ORDER — PROPOFOL 10 MG/ML IV BOLUS
INTRAVENOUS | Status: DC | PRN
  Administered 2019-06-29: 180 mg via INTRAVENOUS

## 2019-06-29 MED ORDER — KETOROLAC TROMETHAMINE 30 MG/ML IJ SOLN
30.0000 mg | Freq: Once | INTRAMUSCULAR | Status: AC
  Administered 2019-06-29: 13:00:00 30 mg via INTRAVENOUS
  Filled 2019-06-29: qty 1

## 2019-06-29 MED ORDER — CEFAZOLIN SODIUM-DEXTROSE 2-4 GM/100ML-% IV SOLN
INTRAVENOUS | Status: AC
  Filled 2019-06-29: qty 100

## 2019-06-29 MED ORDER — MEPERIDINE HCL 25 MG/ML IJ SOLN
6.2500 mg | INTRAMUSCULAR | Status: DC | PRN
  Filled 2019-06-29: qty 1

## 2019-06-29 SURGICAL SUPPLY — 52 items
BARRIER ADHS 3X4 INTERCEED (GAUZE/BANDAGES/DRESSINGS) IMPLANT
CABLE HIGH FREQUENCY MONO STRZ (ELECTRODE) IMPLANT
CELL SAVER LIPIGURD (MISCELLANEOUS) IMPLANT
CLOTH BEACON ORANGE TIMEOUT ST (SAFETY) ×3 IMPLANT
COVER BACK TABLE 60X90IN (DRAPES) ×3 IMPLANT
COVER MAYO STAND STRL (DRAPES) ×3 IMPLANT
COVER SURGICAL LIGHT HANDLE (MISCELLANEOUS) ×3 IMPLANT
COVER WAND RF STERILE (DRAPES) IMPLANT
DERMABOND ADVANCED (GAUZE/BANDAGES/DRESSINGS) ×1
DERMABOND ADVANCED .7 DNX12 (GAUZE/BANDAGES/DRESSINGS) ×2 IMPLANT
DEVICE SUTURE ENDOST 10MM (ENDOMECHANICALS) ×3 IMPLANT
DRSG OPSITE POSTOP 3X4 (GAUZE/BANDAGES/DRESSINGS) ×3 IMPLANT
DURAPREP 26ML APPLICATOR (WOUND CARE) ×3 IMPLANT
EXTRT SYSTEM ALEXIS 14CM (MISCELLANEOUS)
EXTRT SYSTEM ALEXIS 17CM (MISCELLANEOUS)
GAUZE 4X4 16PLY RFD (DISPOSABLE) ×3 IMPLANT
GLOVE BIO SURGEON STRL SZ 6.5 (GLOVE) ×12 IMPLANT
GOWN STRL REUS W/TWL LRG LVL3 (GOWN DISPOSABLE) ×6 IMPLANT
IRRIG SUCT STRYKERFLOW 2 WTIP (MISCELLANEOUS) ×3
IRRIGATION SUCT STRKRFLW 2 WTP (MISCELLANEOUS) ×2 IMPLANT
NS IRRIG 1000ML POUR BTL (IV SOLUTION) ×3 IMPLANT
OCCLUDER COLPOPNEUMO (BALLOONS) ×3 IMPLANT
PACK LAPAROSCOPY BASIN (CUSTOM PROCEDURE TRAY) ×3 IMPLANT
PACK TRENDGUARD 450 HYBRID PRO (MISCELLANEOUS) ×2 IMPLANT
SCISSORS LAP 5X35 DISP (ENDOMECHANICALS) IMPLANT
SET IRRIG TUBING LAPAROSCOPIC (IRRIGATION / IRRIGATOR) ×3 IMPLANT
SET IRRIG Y TYPE TUR BLADDER L (SET/KITS/TRAYS/PACK) ×3 IMPLANT
SET TRI-LUMEN FLTR TB AIRSEAL (TUBING) IMPLANT
SHEARS HARMONIC ACE PLUS 36CM (ENDOMECHANICALS) ×3 IMPLANT
SUT ENDO VLOC 180-0-8IN (SUTURE) ×3 IMPLANT
SUT PLAIN 2 0 XLH (SUTURE) IMPLANT
SUT VIC AB 0 CT1 27 (SUTURE) ×2
SUT VIC AB 0 CT1 27XBRD ANBCTR (SUTURE) ×4 IMPLANT
SUT VIC AB 2-0 CT1 (SUTURE) IMPLANT
SUT VIC AB 4-0 KS 27 (SUTURE) IMPLANT
SUT VIC AB 4-0 PS2 27 (SUTURE) ×3 IMPLANT
SUT VICRYL 0 UR6 27IN ABS (SUTURE) ×3 IMPLANT
SYR 10ML LL (SYRINGE) ×3 IMPLANT
SYR 50ML LL SCALE MARK (SYRINGE) ×3 IMPLANT
SYSTEM CARTER THOMASON II (TROCAR) ×3 IMPLANT
SYSTEM CONTND EXTRCTN KII BLLN (MISCELLANEOUS) IMPLANT
TIP UTERINE 5.1X6CM LAV DISP (MISCELLANEOUS) IMPLANT
TIP UTERINE 6.7X10CM GRN DISP (MISCELLANEOUS) IMPLANT
TIP UTERINE 6.7X6CM WHT DISP (MISCELLANEOUS) IMPLANT
TIP UTERINE 6.7X8CM BLUE DISP (MISCELLANEOUS) ×3 IMPLANT
TOWEL OR 17X26 10 PK STRL BLUE (TOWEL DISPOSABLE) ×6 IMPLANT
TRAY FOLEY W/BAG SLVR 14FR (SET/KITS/TRAYS/PACK) ×3 IMPLANT
TRENDGUARD 450 HYBRID PRO PACK (MISCELLANEOUS) ×3
TROCAR BLADELESS OPT 5 100 (ENDOMECHANICALS) ×6 IMPLANT
TROCAR PORT AIRSEAL 5X120 (TROCAR) IMPLANT
TROCAR XCEL NON-BLD 11X100MML (ENDOMECHANICALS) ×3 IMPLANT
WARMER LAPAROSCOPE (MISCELLANEOUS) ×3 IMPLANT

## 2019-06-29 NOTE — Brief Op Note (Signed)
06/29/2019  11:11 AM  PATIENT:  Joanne Molina  39 y.o. female  PRE-OPERATIVE DIAGNOSIS:  menorrhagia, dysmenorrhea  POST-OPERATIVE DIAGNOSIS:  menorrhagia, dysmenorrhea  PROCEDURE:  Procedure(s) with comments: TOTAL LAPAROSCOPIC HYSTERECTOMY WITH SALPINGECTOMY, poss open (Bilateral) - smoke evac rep will be here [name] CYSTOSCOPY (N/A)  SURGEON:  Surgeon(s) and Role:    * Editha Bridgeforth, Bonnee Quin, DO - Primary    * Paula Compton, MD - Assisting  PHYSICIAN ASSISTANT:   ASSISTANTS: general   ANESTHESIA:   general  EBL:  75 mL   BLOOD ADMINISTERED:none  DRAINS: Urinary Catheter (Foley)   LOCAL MEDICATIONS USED:  MARCAINE     SPECIMEN:  Source of Specimen:  uterus, cervix , both fallopian tubes  DISPOSITION OF SPECIMEN:  PATHOLOGY  COUNTS:  YES  TOURNIQUET:  * No tourniquets in log *  DICTATION: .Note written in EPIC  PLAN OF CARE: extended recovery  PATIENT DISPOSITION:  PACU - hemodynamically stable.   Delay start of Pharmacological VTE agent (>24hrs) due to surgical blood loss or risk of bleeding: not applicable

## 2019-06-29 NOTE — Anesthesia Procedure Notes (Signed)
Procedure Name: Intubation Date/Time: 06/29/2019 8:52 AM Performed by: Donne Baley D, CRNA Pre-anesthesia Checklist: Patient identified, Emergency Drugs available, Suction available and Patient being monitored Patient Re-evaluated:Patient Re-evaluated prior to induction Oxygen Delivery Method: Circle system utilized Preoxygenation: Pre-oxygenation with 100% oxygen Induction Type: IV induction Ventilation: Mask ventilation without difficulty Laryngoscope Size: Mac and 4 Grade View: Grade I Tube type: Oral Tube size: 7.0 mm Number of attempts: 1 Airway Equipment and Method: Stylet and Oral airway Placement Confirmation: ETT inserted through vocal cords under direct vision,  positive ETCO2 and breath sounds checked- equal and bilateral Secured at: 21 cm Tube secured with: Tape Dental Injury: Teeth and Oropharynx as per pre-operative assessment

## 2019-06-29 NOTE — Anesthesia Postprocedure Evaluation (Signed)
Anesthesia Post Note  Patient: Joanne Molina  Procedure(s) Performed: TOTAL LAPAROSCOPIC HYSTERECTOMY WITH SALPINGECTOMY, poss open (Bilateral Uterus) CYSTOSCOPY (N/A Bladder)     Patient location during evaluation: PACU Anesthesia Type: General Level of consciousness: awake and alert Pain management: pain level controlled Vital Signs Assessment: post-procedure vital signs reviewed and stable Respiratory status: spontaneous breathing, nonlabored ventilation, respiratory function stable and patient connected to nasal cannula oxygen Cardiovascular status: blood pressure returned to baseline and stable Postop Assessment: no apparent nausea or vomiting Anesthetic complications: no    Last Vitals:  Vitals:   06/29/19 1400 06/29/19 1805  BP: 101/63 110/73  Pulse: 80 84  Resp: 18 18  Temp: 36.6 C 37 C  SpO2: 97% 97%    Last Pain:  Vitals:   06/29/19 1900  TempSrc:   PainSc: 2                  Mykell Genao

## 2019-06-29 NOTE — Interval H&P Note (Signed)
History and Physical Interval Note: Pt doing well. Has no change since H/P done. Reviewed procedure and post op expectations Consent re-verified To OR when ready  06/29/2019 8:26 AM  [name]  has presented today for surgery, with the diagnosis of menorrhagia, dysmenorrhea.  The various methods of treatment have been discussed with the patient and family. After consideration of risks, benefits and other options for treatment, the patient has consented to  Procedure(s) with comments: TOTAL LAPAROSCOPIC HYSTERECTOMY WITH SALPINGECTOMY, poss open (Bilateral) - smoke evac rep will be here [name] CYSTOSCOPY (N/A) as a surgical intervention.  The patient's history has been reviewed, patient examined, no change in status, stable for surgery.  I have reviewed the patient's chart and labs.  Questions were answered to the patient's satisfaction.     Joanne Molina

## 2019-06-29 NOTE — Transfer of Care (Signed)
Immediate Anesthesia Transfer of Care Note  Patient: Joanne Molina  Procedure(s) Performed: TOTAL LAPAROSCOPIC HYSTERECTOMY WITH SALPINGECTOMY, poss open (Bilateral Uterus) CYSTOSCOPY (N/A Bladder)  Patient Location: PACU  Anesthesia Type:General  Level of Consciousness: awake, alert  and oriented  Airway & Oxygen Therapy: Patient Spontanous Breathing and Patient connected to nasal cannula oxygen  Post-op Assessment: Report given to RN and Post -op Vital signs reviewed and stable  Post vital signs: Reviewed and stable  Last Vitals:  Vitals Value Taken Time  BP    Temp    Pulse 86 06/29/19 1121  Resp 15 06/29/19 1121  SpO2 100 % 06/29/19 1121  Vitals shown include unvalidated device data.  Last Pain:  Vitals:   06/29/19 0746  TempSrc:   PainSc: 0-No pain      Patients Stated Pain Goal: 6 (58/59/29 2446)  Complications: No apparent anesthesia complications

## 2019-06-29 NOTE — Op Note (Signed)
Operative Note    Preoperative Diagnosis 1. Menorrhagia 2. Dysmenorrhea   Postoperative Diagnosis:  Same   Procedure: Total laparoscopic hysterectomy with bilateral salpingectomy, cystoscopy   Surgeon: Mickle Mallory DO Assist: Sharen Heck MD  Anesthesia: General  Fluids: LR 985ml EBL: 45ml UOP: 139ml   Findings: Grossly normal uterus ,tubes and ovaries   Specimen: uterus, cervix, bilateral fallopian tubes   Procedure Note Pt seen in pre-op. Procedure reviewed and all questions answered; consent verified  Pt taken to operating room and placed in dorsal lithotomy position with her arms out . General anesthesia was administered and found to be adequate. Pt was prepped and draped in sterile fashion and a timeout performed. A weighted speculum was placed in the posterior fornix and a sim retractor placed anteriorly. Excellent visualization of the cervix was obtained. Uterus was sounded to 9cm so a size 8 koh was assembled with a medium cup and placed with retention sutures at 3 and 9 o'clock. A foley catheter was also placed in a sterile fashion Legs were then lowered and attention turned to her abdomen.  Here a 59mm incision was made at the umbilicus. A 62mm optiview trocar was then placed with the abdomen tented upwards. The laparoscopic camera was used to confirm placement and pneumoperitoneum obtained with CO2 gas to 42mmHg. The patient was placed in trendelenberg and gross survey of pelvis done.The uterus was noted as described above:  At this time one more 64mm port was then placed under direct visualization in right lower quadrant and an 11 site in the left with care taken to avoid the epigastric vessels. Further exploration noted  Both ureters were visualized along lateral sidewalls.  Starting on the the patients left, the left fallopian tube was then grasped with a blunt grasper, elevated and excised using the harmonic hemostatically. Next the utero-ovarian ligament and the  round ligaments were sequentially grasped and excised. The broad ligament was then separated from the uterus as well with a bladder flap created. The cardinal ligament was then excised next at the utero-cervical junction and the ovarian vessel clamped, cauterized and cut. The same was done on the patients right with similar results.  Next the bladder reflection was dissected away. The vaginal occluder was filled with 50cc of saline and starting anteriorly and working laterally the uterus and cervix were amputated off the superior vagina. The uterus, cervix and fallopian tubes were removed.  The pelvis was irrigated and hemostasis noted. The angles of the vaginal cuff were easily seen and using the endostitch with an 0 vicryl v-lock suture, the cuff was closed in a running fashion with 3 back stitches to ensure closure.  Further irrigation of the pelvis confirmed no abnormalities or bleeding hence patient was flattened. The 11 port site was closed with 0-vicryl suture with a carter thomasen to ensure closure of the peritoneum.  The remaining  trocars were removed under direct visualization and gas allowed to escape.  Incision sites were closed with 4-0 vicryl suture and dermabond. Counts were noted to be correct. Patient was awakened and taken to recovery room in stable status.  Foley was left in place

## 2019-06-30 ENCOUNTER — Encounter (HOSPITAL_BASED_OUTPATIENT_CLINIC_OR_DEPARTMENT_OTHER): Payer: Self-pay | Admitting: Obstetrics and Gynecology

## 2019-06-30 DIAGNOSIS — J45998 Other asthma: Secondary | ICD-10-CM | POA: Diagnosis not present

## 2019-06-30 DIAGNOSIS — N946 Dysmenorrhea, unspecified: Secondary | ICD-10-CM | POA: Diagnosis not present

## 2019-06-30 DIAGNOSIS — Z881 Allergy status to other antibiotic agents status: Secondary | ICD-10-CM | POA: Diagnosis not present

## 2019-06-30 DIAGNOSIS — N72 Inflammatory disease of cervix uteri: Secondary | ICD-10-CM | POA: Diagnosis not present

## 2019-06-30 DIAGNOSIS — N921 Excessive and frequent menstruation with irregular cycle: Secondary | ICD-10-CM | POA: Diagnosis not present

## 2019-06-30 DIAGNOSIS — N920 Excessive and frequent menstruation with regular cycle: Secondary | ICD-10-CM | POA: Diagnosis not present

## 2019-06-30 DIAGNOSIS — N841 Polyp of cervix uteri: Secondary | ICD-10-CM | POA: Diagnosis not present

## 2019-06-30 DIAGNOSIS — N888 Other specified noninflammatory disorders of cervix uteri: Secondary | ICD-10-CM | POA: Diagnosis not present

## 2019-06-30 DIAGNOSIS — N938 Other specified abnormal uterine and vaginal bleeding: Secondary | ICD-10-CM | POA: Diagnosis not present

## 2019-06-30 DIAGNOSIS — F419 Anxiety disorder, unspecified: Secondary | ICD-10-CM | POA: Diagnosis not present

## 2019-06-30 DIAGNOSIS — F329 Major depressive disorder, single episode, unspecified: Secondary | ICD-10-CM | POA: Diagnosis not present

## 2019-06-30 LAB — CBC
HCT: 34.5 % — ABNORMAL LOW (ref 36.0–46.0)
Hemoglobin: 11.1 g/dL — ABNORMAL LOW (ref 12.0–15.0)
MCH: 30.2 pg (ref 26.0–34.0)
MCHC: 32.2 g/dL (ref 30.0–36.0)
MCV: 93.8 fL (ref 80.0–100.0)
Platelets: 210 10*3/uL (ref 150–400)
RBC: 3.68 MIL/uL — ABNORMAL LOW (ref 3.87–5.11)
RDW: 13.2 % (ref 11.5–15.5)
WBC: 9.6 10*3/uL (ref 4.0–10.5)
nRBC: 0 % (ref 0.0–0.2)

## 2019-06-30 MED ORDER — OXYCODONE-ACETAMINOPHEN 5-325 MG PO TABS
ORAL_TABLET | ORAL | Status: AC
  Filled 2019-06-30: qty 1

## 2019-06-30 MED ORDER — OXYCODONE-ACETAMINOPHEN 5-325 MG PO TABS: 1.0000 | ORAL_TABLET | ORAL | 0 refills | Status: AC | PRN

## 2019-06-30 MED ORDER — IBUPROFEN 800 MG PO TABS: 800.0000 mg | ORAL_TABLET | Freq: Three times a day (TID) | ORAL | 1 refills | Status: DC | PRN

## 2019-06-30 MED ORDER — IBUPROFEN 200 MG PO TABS
ORAL_TABLET | ORAL | Status: AC
  Filled 2019-06-30: qty 4

## 2019-06-30 NOTE — Discharge Instructions (Signed)
Laparoscopically Assisted Vaginal Hysterectomy, Care After °This sheet gives you information about how to care for yourself after your procedure. Your health care provider may also give you more specific instructions. If you have problems or questions, contact your health care provider. °What can I expect after the procedure? °After the procedure, it is common to have: °· Soreness and numbness in your incision areas. °· Abdominal pain. You will be given pain medicine to control it. °· Vaginal bleeding and discharge. You will need to use a sanitary napkin after this procedure. °· Sore throat from the breathing tube that was inserted during surgery. °Follow these instructions at home: °Medicines °· Take over-the-counter and prescription medicines only as told by your health care provider. °· Do not take aspirin or ibuprofen. These medicines can cause bleeding. °· Do not drive or use heavy machinery while taking prescription pain medicine. °· Do not drive for 24 hours if you were given a medicine to help you relax (sedative) during the procedure. °Incision care ° °· Follow instructions from your health care provider about how to take care of your incisions. Make sure you: °? Wash your hands with soap and water before you change your bandage (dressing). If soap and water are not available, use hand sanitizer. °? Change your dressing as told by your health care provider. °? Leave stitches (sutures), skin glue, or adhesive strips in place. These skin closures may need to stay in place for 2 weeks or longer. If adhesive strip edges start to loosen and curl up, you may trim the loose edges. Do not remove adhesive strips completely unless your health care provider tells you to do that. °· Check your incision area every day for signs of infection. Check for: °? Redness, swelling, or pain. °? Fluid or blood. °? Warmth. °? Pus or a bad smell. °Activity °· Get regular exercise as told by your health care provider. You may be  told to take short walks every day and go farther each time. °· Return to your normal activities as told by your health care provider. Ask your health care provider what activities are safe for you. °· Do not douche, use tampons, or have sexual intercourse for at least 6 weeks, or until your health care provider gives you permission. °· Do not lift anything that is heavier than 10 lb (4.5 kg), or the limit that your health care provider tells you, until he or she says that it is safe. °General instructions °· Do not take baths, swim, or use a hot tub until your health care provider approves. Take showers instead of baths. °· Do not drive for 24 hours if you received a sedative. °· Do not drive or operate heavy machinery while taking prescription pain medicine. °· To prevent or treat constipation while you are taking prescription pain medicine, your health care provider may recommend that you: °? Drink enough fluid to keep your urine clear or pale yellow. °? Take over-the-counter or prescription medicines. °? Eat foods that are high in fiber, such as fresh fruits and vegetables, whole grains, and beans. °? Limit foods that are high in fat and processed sugars, such as fried and sweet foods. °· Keep all follow-up visits as told by your health care provider. This is important. °Contact a health care provider if: °· You have signs of infection, such as: °? Redness, swelling, or pain around your incision sites. °? Fluid or blood coming from an incision. °? An incision that feels warm to the   touch. °? Pus or a bad smell coming from an incision. °· Your incision breaks open. °· Your pain medicine is not helping. °· You feel dizzy or light-headed. °· You have pain or bleeding when you urinate. °· You have persistent nausea and vomiting. °· You have blood, pus, or a bad-smelling discharge from your vagina. °Get help right away if: °· You have a fever. °· You have severe abdominal pain. °· You have chest pain. °· You have  shortness of breath. °· You faint. °· You have pain, swelling, or redness in your leg. °· You have heavy bleeding from your vagina. °Summary °· After the procedure, it is common to have abdominal pain and vaginal bleeding. °· You should not drive or lift heavy objects until your health care provider says that it is safe. °· Contact your health care provider if you have any symptoms of infection, excessive vaginal bleeding, nausea, vomiting, or shortness of breath. °This information is not intended to replace advice given to you by your health care provider. Make sure you discuss any questions you have with your health care provider. °Document Released: 12/06/2011 Document Revised: 11/29/2017 Document Reviewed: 02/12/2017 °Elsevier Patient Education © 2020 [employer] °Call office with any concerns [phone] °

## 2019-06-30 NOTE — Discharge Summary (Signed)
Physician Discharge Summary  Patient ID: Joanne Molina MRN: [id number] DOB/AGE: 04/22/1980 39 y.o.  Admit date: 06/29/2019 Discharge date: 06/30/2019  Admission Diagnoses:  Discharge Diagnoses:  Active Problems:   Menorrhagia with regular cycle   S/P laparoscopic hysterectomy  and bilateral salpingectomy and cystoscopy   Discharged Condition: stable  Hospital Course: Pt recovered well post op. She ambulated, voided with foley removed and tolerated PO. Stable for discharge next am  Consults: None  Significant Diagnostic Studies: labs: cbc wnl  Treatments: analgesia: percocet and ibuprofen and surgery: Total laparoscopic hysterectomy and bilateral salpingectomy and cystoscopy  Discharge Exam: Blood pressure 123/78, pulse 85, temperature 98.1 F (36.7 C), resp. rate 18, height 5\' 7"  (1.702 m), weight 78.1 kg, last menstrual period 06/02/2019, SpO2 98 %, unknown if currently breastfeeding. General appearance: alert, cooperative and no distress Incision/Wound:c/d/i  Disposition:   Discharge Instructions    Call MD for:  difficulty breathing, headache or visual disturbances   Complete by: As directed    Call MD for:  persistant nausea and vomiting   Complete by: As directed    Call MD for:  redness, tenderness, or signs of infection (pain, swelling, redness, odor or green/yellow discharge around incision site)   Complete by: As directed    Call MD for:  severe uncontrolled pain   Complete by: As directed    Call MD for:  temperature >100.4   Complete by: As directed    Diet - low sodium heart healthy   Complete by: As directed    Increase activity slowly   Complete by: As directed      Allergies as of 06/30/2019      Reactions   Erythromycin Nausea Only      Medication List    TAKE these medications   albuterol 108 (90 Base) MCG/ACT inhaler Commonly known as: VENTOLIN HFA Inhale 1 puff into the lungs every 6 (six) hours as needed for wheezing or shortness of  breath.   ALPRAZolam 1 MG tablet Commonly known as: XANAX Take 1 tablet (1 mg total) by mouth 2 (two) times daily as needed for anxiety.   amoxicillin-clavulanate 875-125 MG tablet Commonly known as: AUGMENTIN Take 1 tablet by mouth 2 (two) times daily.   bisoprolol 5 MG tablet Commonly known as: ZEBETA Take 1 tablet (5 mg total) by mouth daily.   Eszopiclone 3 MG Tabs TAKE 1 TABLET BY MOUTH AT BEDTIME. TAKE IMMEDIATELY BEFORE BEDTIME What changed: See the new instructions.   ibuprofen 800 MG tablet Commonly known as: ADVIL Take 1 tablet (800 mg total) by mouth every 8 (eight) hours as needed for moderate pain or cramping.   ISOtretinoin 20 MG capsule Commonly known as: ACCUTANE Take 20 mg by mouth daily.   mometasone-formoterol 100-5 MCG/ACT Aero Commonly known as: DULERA Take 2 puffs first thing in am and then another 2 puffs about 12 hours later. What changed:   how much to take  how to take this  when to take this   mupirocin ointment 2 % Commonly known as: BACTROBAN Apply 1 application topically daily as needed (breakouts on face).   oxyCODONE-acetaminophen 5-325 MG tablet Commonly known as: PERCOCET/ROXICET Take 1 tablet by mouth every 4 (four) hours as needed for up to 7 days for moderate pain or severe pain.   pantoprazole 40 MG tablet Commonly known as: PROTONIX Take 40 mg by mouth daily.   sertraline 100 MG tablet Commonly known as: ZOLOFT Take 2 tablets (200 mg total) by mouth  daily.      Follow-up Information    Go to [facility] AreolaBanga, Cecilia Worema, DO.   Specialty: Obstetrics and Gynecology Why: In 2 weeks for post op incision check Contact information: [address] [place] [address] [phone]           Signed: Cathrine MusterCecilia W Banga 06/30/2019, 5:25 PM

## 2019-07-23 ENCOUNTER — Encounter (HOSPITAL_BASED_OUTPATIENT_CLINIC_OR_DEPARTMENT_OTHER): Payer: Self-pay | Admitting: Obstetrics and Gynecology

## 2019-08-12 ENCOUNTER — Other Ambulatory Visit: Payer: Self-pay

## 2019-08-12 ENCOUNTER — Encounter: Payer: Self-pay | Admitting: Physician Assistant

## 2019-08-12 ENCOUNTER — Ambulatory Visit (INDEPENDENT_AMBULATORY_CARE_PROVIDER_SITE_OTHER): Payer: BC Managed Care – PPO | Admitting: Physician Assistant

## 2019-08-12 DIAGNOSIS — G4700 Insomnia, unspecified: Secondary | ICD-10-CM

## 2019-08-12 DIAGNOSIS — F424 Excoriation (skin-picking) disorder: Secondary | ICD-10-CM

## 2019-08-12 DIAGNOSIS — F411 Generalized anxiety disorder: Secondary | ICD-10-CM

## 2019-08-12 MED ORDER — ESZOPICLONE 3 MG PO TABS: 3.0000 mg | ORAL_TABLET | Freq: Every day | ORAL | 5 refills | Status: DC

## 2019-08-12 MED ORDER — SERTRALINE HCL 100 MG PO TABS: 200.0000 mg | ORAL_TABLET | Freq: Every day | ORAL | 1 refills | Status: DC

## 2019-08-12 MED ORDER — ALPRAZOLAM 1 MG PO TABS: 1.0000 mg | ORAL_TABLET | Freq: Two times a day (BID) | ORAL | 5 refills | Status: DC | PRN

## 2019-08-12 NOTE — Progress Notes (Signed)
[facility]  Patient ID: Joanne BentonMary K Tondreau,  MRN: [id number]  PCP: Shirline FreesNafziger, Cory, NP  Date of Evaluation: 08/12/2019 Time spent:15 minutes  Chief Complaint:  Chief Complaint    Anxiety; Follow-up     Virtual Visit via Telephone Note  I connected with patient by a video enabled telemedicine application or telephone, with their informed consent, and verified patient privacy and that I am speaking with the correct person using two identifiers.  I am private, in my home and the patient is home.   I discussed the limitations, risks, security and privacy concerns of performing an evaluation and management service by telephone and the availability of in person appointments. I also discussed with the patient that there may be a patient responsible charge related to this service. The patient expressed understanding and agreed to proceed.   I discussed the assessment and treatment plan with the patient. The patient was provided an opportunity to ask questions and all were answered. The patient agreed with the plan and demonstrated an understanding of the instructions.   The patient was advised to call back or seek an in-person evaluation if the symptoms worsen or if the condition fails to improve as anticipated.  I provided 15 minutes of non-face-to-face time during this encounter.  HISTORY/CURRENT STATUS: HPI For routine med check.   A lot of changes lately, but feeling really good. 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  [name] very excited about that.  She had some anxiety when she went through the hysterectomy back in June but she realized it was due to circumstances and was able to do deep breathing and other exercises that help her cope.  The anxiety has otherwise been controlled.  [name] able to enjoy things.  Energy and motivation are good.  No isolating other than what is required due to the  coronavirus pandemic.  Denies suicidal or homicidal thoughts.  Sleeps well with the Lunesta.  She does not take it every night but needs it more often than not.  Denies dizziness, syncope, seizures, numbness, tingling, tremor, tics, unsteady gait, slurred speech, confusion. Denies muscle or joint pain, stiffness, or dystonia.  Individual Medical History/ Review of Systems: Changes? :Yes dx w/ asthma since LOV.  Feels much better since she is on inhalers now. Had hysterectomy 06/29/19 (still has ovaries)  Past medications for mental health diagnoses include: Sonata,Paxil, Celexa, Effexor, BuSpar made the anxiety worse, Ativan, Klonopin, Prozac, Xanax, Ambien  Allergies: Erythromycin  Current Medications:  Current Outpatient Medications:  .  albuterol (PROVENTIL HFA;VENTOLIN HFA) 108 (90 Base) MCG/ACT inhaler, Inhale 1 puff into the lungs every 6 (six) hours as needed for wheezing or shortness of breath., Disp: , Rfl:  .  ALPRAZolam (XANAX) 1 MG tablet, Take 1 tablet (1 mg total) by mouth 2 (two) times daily as needed for anxiety., Disp: 60 tablet, Rfl: 5 .  bisoprolol (ZEBETA) 5 MG tablet, Take 1 tablet (5 mg total) by mouth daily. (Patient taking differently: Take 5 mg by mouth daily. ), Disp: 30 tablet, Rfl: 11 .  Eszopiclone 3 MG TABS, Take 1 tablet (3 mg total) by mouth at bedtime. Take immediately before bedtime, Disp: 30 tablet, Rfl: 5 .  ISOtretinoin (ACCUTANE) 20 MG capsule, Take 20 mg by mouth daily., Disp: , Rfl:  .  mometasone-formoterol (DULERA) 100-5 MCG/ACT AERO, Take 2 puffs first thing in am and then another 2 puffs about 12  hours later. (Patient taking differently: Inhale 1 puff into the lungs 2 (two) times a day. Take 2 puffs first thing in am and then another 2 puffs about 12 hours later.), Disp: 1 Inhaler, Rfl: 11 .  pantoprazole (PROTONIX) 40 MG tablet, Take 40 mg by mouth daily., Disp: , Rfl: 1 .  sertraline (ZOLOFT) 100 MG tablet, Take 2 tablets (200 mg total) by mouth  daily., Disp: 180 tablet, Rfl: 1 .  amoxicillin-clavulanate (AUGMENTIN) 875-125 MG tablet, Take 1 tablet by mouth 2 (two) times daily. (Patient not taking: Reported on 06/22/2019), Disp: 14 tablet, Rfl: 0 .  ibuprofen (ADVIL) 800 MG tablet, Take 1 tablet (800 mg total) by mouth every 8 (eight) hours as needed for moderate pain or cramping. (Patient not taking: Reported on 08/12/2019), Disp: 30 tablet, Rfl: 1 .  mupirocin ointment (BACTROBAN) 2 %, Apply 1 application topically daily as needed (breakouts on face)., Disp: , Rfl:  Medication Side Effects: none  Family Medical/ Social History: Changes?  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  General Appearance: unable to assess  Eye Contact:  unable to assess  Speech:  Clear and Coherent  Volume:  Normal  Mood:  Euthymic  Affect:  unable to assess  Thought Process:  Goal Directed  Orientation:  Full (Time, Place, and Person)  Thought Content: Logical   Suicidal Thoughts:  No  Homicidal Thoughts:  No  Memory:  WNL  Judgement:  Good  Insight:  Good  Psychomotor Activity:  unable to assess  Concentration:  Concentration: Good  Recall:  Good  Fund of Knowledge: Good  Language: Good  Assets:  Desire for Improvement  ADL's:  Intact  Cognition: WNL  Prognosis:  Good    DIAGNOSES:    ICD-10-CM   1. Generalized anxiety disorder  F41.1   2. Insomnia, unspecified type  G47.00   3. Skin picking habit  F42.4     Receiving Psychotherapy: No    RECOMMENDATIONS:  Continue Xanax 1 mg 1 p.o. twice daily as needed. Continue Lunesta 3 mg nightly as needed sleep. Continue Zoloft 100 mg, 2 p.o. daily. Continue coping skills. Return in 6 months.  Donnal Moat, PA-C   This record has been created using [employer].  Chart creation errors have been sought,  but may not always have been located and corrected. Such creation errors do not reflect on the standard of medical care.

## 2019-08-26 ENCOUNTER — Telehealth: Payer: BC Managed Care – PPO | Admitting: Nurse Practitioner

## 2019-08-26 DIAGNOSIS — N3000 Acute cystitis without hematuria: Secondary | ICD-10-CM

## 2019-08-26 MED ORDER — CEPHALEXIN 500 MG PO CAPS: 500.0000 mg | ORAL_CAPSULE | Freq: Two times a day (BID) | ORAL | 0 refills | Status: DC

## 2019-08-26 NOTE — Progress Notes (Signed)

## 2019-09-08 ENCOUNTER — Telehealth (INDEPENDENT_AMBULATORY_CARE_PROVIDER_SITE_OTHER): Payer: BC Managed Care – PPO | Admitting: Adult Health

## 2019-09-08 ENCOUNTER — Encounter: Payer: Self-pay | Admitting: Adult Health

## 2019-09-08 ENCOUNTER — Other Ambulatory Visit: Payer: Self-pay

## 2019-09-08 DIAGNOSIS — J069 Acute upper respiratory infection, unspecified: Secondary | ICD-10-CM

## 2019-09-08 DIAGNOSIS — K2100 Gastro-esophageal reflux disease with esophagitis, without bleeding: Secondary | ICD-10-CM

## 2019-09-08 MED ORDER — AZITHROMYCIN 250 MG PO TABS: ORAL_TABLET | ORAL | 0 refills | Status: DC

## 2019-09-08 MED ORDER — PREDNISONE 10 MG PO TABS: ORAL_TABLET | ORAL | 0 refills | Status: DC
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
## 2019-09-09 ENCOUNTER — Telehealth: Payer: Self-pay | Admitting: Internal Medicine

## 2019-09-09 NOTE — Telephone Encounter (Signed)
Called and spoke with pt regarding MW's response below. Pt states her symptoms are reflective of her prior flares. She states she gets these same flares every year around this time (September-October). I let her know, per MW, that since this is the case she could still be seen; however, if her symptoms worsen before her appt and she starts to feel acutely ill with new onset shortness of breath, fever/chills/body aches, etc to seek emergency care and get tested for COVID. Pt expressed understanding with no additional questions or concerns. Routing to MW as an Pharmacist, hospital. Nothing further needed at this time.

## 2019-09-09 NOTE — Telephone Encounter (Signed)
ATC patient unable to reach left detailed message on machine per DPR. And to have patient call back

## 2019-09-09 NOTE — Telephone Encounter (Signed)
Patient is returning phone call.  Patient phone number is [phone].

## 2019-09-09 NOTE — Telephone Encounter (Signed)
Depends on how long she's been having flare and whether it's typical of what she's had before and if she's acutely ill with new symptoms then will need to go to er and check for covid first, if it's same as prior flares then I can see her without testing

## 2019-09-09 NOTE — Telephone Encounter (Signed)
Called spoke with patient, she believes she is having an asthma flare up. An appointment was made with Dr. Melvyn Novas 09/10/19 at 1600. When patient was asked the covid screening questions she answered yes to many of them.  1. Increased shortness of breath 2. Congestion 3. Runny nose 4. Cough cannot go an hour without coughing 5. Slight sore throat.   Patient denies traveling or being around anyone who has tested positive.   Dr. Melvyn Novas please advise if this appt should be changed to a video or telephone visit

## 2019-09-10 ENCOUNTER — Ambulatory Visit (INDEPENDENT_AMBULATORY_CARE_PROVIDER_SITE_OTHER): Payer: BC Managed Care – PPO | Admitting: Internal Medicine

## 2019-09-10 ENCOUNTER — Other Ambulatory Visit: Payer: Self-pay

## 2019-09-10 ENCOUNTER — Encounter: Payer: Self-pay | Admitting: Internal Medicine

## 2019-09-10 DIAGNOSIS — I10 Essential (primary) hypertension: Secondary | ICD-10-CM

## 2019-09-10 DIAGNOSIS — J45991 Cough variant asthma: Secondary | ICD-10-CM

## 2019-09-10 DIAGNOSIS — R05 Cough: Secondary | ICD-10-CM | POA: Diagnosis not present

## 2019-09-10 DIAGNOSIS — R059 Cough, unspecified: Secondary | ICD-10-CM

## 2019-09-10 MED ORDER — ALBUTEROL SULFATE (2.5 MG/3ML) 0.083% IN NEBU
2.5000 mg | INHALATION_SOLUTION | Freq: Once | RESPIRATORY_TRACT | Status: AC
  Administered 2019-09-10: 2.5 mg via RESPIRATORY_TRACT

## 2019-09-10 MED ORDER — ACETAMINOPHEN-CODEINE #3 300-30 MG PO TABS: 1.0000 | ORAL_TABLET | ORAL | 0 refills | Status: DC | PRN

## 2019-09-10 MED ORDER — PANTOPRAZOLE SODIUM 40 MG PO TBEC: 40.0000 mg | DELAYED_RELEASE_TABLET | Freq: Every day | ORAL | 2 refills | Status: DC

## 2019-09-10 MED ORDER — FAMOTIDINE 20 MG PO TABS: ORAL_TABLET | ORAL | 11 refills | Status: DC

## 2019-09-10 NOTE — Assessment & Plan Note (Addendum)
Onset in childhood  - Allergy profile 05/14/2019 >  Eos 0.2 /  IgE 36 RAST pos dog > cat, dust, grass   - 05/14/2019  A  changed flovent to dulera 100 2bid and d/c'd inderal - alpha one AT screen 05/14/19  Level 138 MM - 09/10/2019  After extensive coaching inhaler device,  effectiveness =    90%   Worse "hb" since ET now severe typical flare assoc with poorly controlled rhinitis ? sinsusitis but clear exam x for pseudowheeze   .DDX of  difficult airways management almost all start with A and  include Adherence, Ace Inhibitors, Acid Reflux, Active Sinus Disease, Alpha 1 Antitripsin deficiency, Anxiety masquerading as Airways dz,  ABPA,  Allergy(esp in young), Aspiration (esp in elderly), Adverse effects of meds,  Active smoking or vaping, A bunch of PE's (a small clot burden can't cause this syndrome unless there is already severe underlying pulm or vascular dz with poor reserve) plus two Bs  = Bronchiectasis and Beta blocker use..and one C= CHF  Adherence is always the initial "prime suspect" and is a multilayered concern that requires a "trust but verify" approach in every patient - starting with knowing how to use medications, especially inhalers, correctly, keeping up with refills and understanding the fundamental difference between maintenance and prns vs those medications only taken for a very short course and then stopped and not refilled.  - see hfa teaching  ? Acid (or non-acid) GERD > always difficult to exclude as up to 75% of pts in some series report no assoc GI/ Heartburn symptoms> rec max (24h)  acid suppression and diet restrictions/ reviewed and instructions given in writing.   ? Active sinus dz > prior ent eval > 10 y "unhelpful" > repeat sinus ct  ? Allergy > unimpressive rast / neg for ragweed > finish pred   ? Alpha one AT > MM phenotype  ? Anxiety > usually at the bottom of this list of usual suspects but should be included  on this pt's based on H and P and note already on  psychotropics and may interfere with adherence and also interpretation of response or lack thereof to symptom management which can be quite subjective.  - says only thing that helps is alb neb and is last pt of day so rx neb alb x one but explained same as increasing saba for which she is no where near max dose at present   ? Beta blocker effect > not as likely on bisoprolol -see hbp   I had an extended discussion with the patient reviewing all relevant studies completed to date and  lasting 25 minutes of a 40  minute acute office  visit    re  severe non-specific but potentially very serious refractory respiratory symptoms of uncertain and potentially multiple  etiologies.  I performed device teaching  using a teach back technique which also  extended face to face time for this visit (see above)   Each maintenance medication was reviewed in detail including most importantly the difference between maintenance and prns and under what circumstances the prns are to be triggered using an action plan format that is not reflected in the computer generated alphabetically organized AVS.    Please see AVS for specific instructions unique to this office visit that I personally wrote and verbalized to the the pt in detail and then reviewed with pt  by my nurse highlighting any changes in therapy/plan of care  recommended at today's visit.

## 2019-09-10 NOTE — Progress Notes (Signed)
Joanne Molina, female    DOB: Jul 04, 1980,    MRN: [id number]   Brief patient profile:  18 yowf never smoker with "lifelong" recurrent cough once or twice a year esp in school and in cold weather  pattern then around 2017 pattern changed to cough and sob esp at hs assoc with subjective wheeze which did not really resolve between flares = x tol poor remained poor doe > to UC repeatedly rx  with neb and "all better for a day or two" pattern continued s maint rx until fall 2019 assoc with nasal congestion about 80% improved since since  maintained on Flovent 110  So referred to pulmonary clinic 05/14/2019 by Dorothyann Peng.   Nasal congestion x 2010 saw ENT  No better, no relation seasonal changes      History of Present Illness  05/14/2019  Pulmonary/ 1st office eval/Raeann Offner on inderal 20 mg hs  Chief Complaint  Patient presents with  . Pulmonary Consult    Referred by Dorothyann Peng, NP. Pt c/o cough and wheezing- occurs in the late summer through the winter.   Dyspnea:  Still some doe variable chest tight / wheeze  Cough: mostly dry assoc with nasal congestion  Sleep: better now on flovent  SABA use: last used 3 d prior to OV  / increase need in cold weather  rec Please remember to go to the lab department   for your tests - we will call you with the results when they are available. Stop flovent and inderal Start bisoprolol 5 mg daily  Plan A = Automatic = Dulera 100 1-2 puffs every 12 hours  Plan B = Backup Only use your albuterol inhaler as a rescue medication   On dulera 100 one bid could do anything she walk anywhere, no need for albuterol but nasal symptoms no better.   06/29/2019 lap hysterectomy > worse GERD on prilosec immediately post op despite prilosec 20 mg daily esp in evening despite prilosec 20 mg before supper.    09/10/2019 acute extended ov/Welda Azzarello re: cough variant asthma vs VCD flare with uri/ reflux  Chief Complaint  Patient presents with  . Acute Visit    Pt c/o  increased cough x 2 wks- started as tickle in her throat and now coughing up white to light yellow sputum. She also c/o sinus congestion and sore throat. [name] using her rescue inhaler 1-2 x per day.   onset 2 weeks nasal congestion worse with tickle with yellow sputum / subjective wheeze  Trouble sleeping due to sob/cough despite 2 pillows hob  Already on dulera 100 2bid / started prednisone 40mg  started on 09/08/2019 and zpak  And changed to nexium 20 from prilosec 20 mg and no better so far  Albuterol total of 4 pffs  /day did not know max dose from  No fever, chills, headache    No obvious day to day or daytime variability or   mucus plugs or hemoptysis or cp or chest tightness,   or overt sinus or hb symptoms.   Sleeping  without nocturnal  or early am exacerbation  of respiratory  c/o's or need for noct saba. Also denies any obvious fluctuation of symptoms with weather or environmental changes or other aggravating or alleviating factors except as outlined above   No unusual exposure hx or h/o childhood pna/ asthma or knowledge of premature birth.  Current Allergies, Complete Past Medical History, Past Surgical History, Family History, and Social History were reviewed in [place]  [facility] electronic medical record.  ROS  The following are not active complaints unless bolded Hoarseness, sore throat, dysphagia, dental problems, itching, sneezing,  nasal congestion or discharge of excess mucus or purulent secretions, ear ache,   fever, chills, sweats, unintended wt loss or wt gain, classically pleuritic or exertional cp,  orthopnea pnd or arm/hand swelling  or leg swelling, presyncope, palpitations, abdominal pain, anorexia, nausea, vomiting, diarrhea  or change in bowel habits or change in bladder habits, change in stools or change in urine, dysuria, hematuria,  rash, arthralgias, visual complaints, headache, numbness, weakness or ataxia or problems with walking or coordination,  change in mood or   memory.        Current Meds  Medication Sig  . albuterol (PROVENTIL HFA;VENTOLIN HFA) 108 (90 Base) MCG/ACT inhaler Inhale 1 puff into the lungs every 6 (six) hours as needed for wheezing or shortness of breath.  . ALPRAZolam (XANAX) 1 MG tablet Take 1 tablet (1 mg total) by mouth 2 (two) times daily as needed for anxiety.  Marland Kitchen azithromycin (ZITHROMAX Z-PAK) 250 MG tablet Take 2 tablets on Day 1.  Then take 1 tablet daily.  . bisoprolol (ZEBETA) 5 MG tablet Take 1 tablet (5 mg total) by mouth daily. (Patient taking differently: Take 5 mg by mouth daily. )  . esomeprazole (NEXIUM) 20 MG capsule Take 20 mg by mouth daily at 12 noon.  . Eszopiclone 3 MG TABS Take 1 tablet (3 mg total) by mouth at bedtime. Take immediately before bedtime  . ISOtretinoin (ACCUTANE) 20 MG capsule Take 20 mg by mouth daily.  . mometasone-formoterol (DULERA) 100-5 MCG/ACT AERO Take 2 puffs first thing in am and then another 2 puffs about 12 hours later. (Patient taking differently: Inhale 1 puff into the lungs 2 (two) times a day. Take 2 puffs first thing in am and then another 2 puffs about 12 hours later.)  . mupirocin ointment (BACTROBAN) 2 % Apply 1 application topically daily as needed (breakouts on face).  . predniSONE (DELTASONE) 10 MG tablet 40 mg x 3 days, 20 mg x 3 days, 10 mg x 3 days  . sertraline (ZOLOFT) 100 MG tablet Take 2 tablets (200 mg total) by mouth daily.         Past Medical History:  Diagnosis Date  . Anxiety    panic attack  . Depression   . Migraine        Objective:    amb wf nad until tries fvc or cough > marked  pseudowheeze  Wt Readings from Last 3 Encounters:  09/10/19 179 lb (81.2 kg)  06/29/19 172 lb 2 oz (78.1 kg)  06/25/19 174 lb 2 oz (79 kg)     Vital signs reviewed - Note on arrival 02 sats  96% on RA    HEENT : pt wearing mask not removed for exam due to covid - 19 concerns.    NECK :  without JVD/Nodes/TM/ nl carotid upstrokes bilaterally   LUNGS: no acc  muscle use,  Nl contour chest which is clear to A and P bilaterally without cough on insp or exp maneuvers   CV:  RRR  no s3 or murmur or increase in P2, and no edema   ABD:  soft and nontender with nl inspiratory excursion in the supine position. No bruits or organomegaly appreciated, bowel sounds nl  MS:  Nl gait/ ext warm without deformities, calf tenderness, cyanosis or clubbing No obvious joint restrictions   SKIN: warm and dry  without lesions    NEURO:  alert, approp, nl sensorium with  no motor or cerebellar deficits apparent.             Assessment

## 2019-09-10 NOTE — Patient Instructions (Addendum)
Plan A = Automatic = Dulera 100 1-2 puffs every 12 hours   Plan B = Backup Only use your albuterol inhaler as a rescue medication to be used if you can't catch your breath by resting or doing a relaxed purse lip breathing pattern.  - The less you use it, the better it will work when you need it. - Ok to use the inhaler up to 2 puffs  every 4 hours if you must but call for appointment if use goes up over your usual need - Don't leave home without it !!  (think of it like the spare tire for your car)   Finish prednisone and zpak   The key to effective treatment for your cough is eliminating the non-stop cycle of cough you're stuck in long enough to let your airway heal completely and then see if there is anything still making you cough once you stop the cough suppression, but this should take no more than 5 days to figure out  Take delsym two tsp every 12 hours and supplement if needed with  Tylenol #3   up to 1-2 every 4 hours to suppress the urge to cough. Swallowing water and/or using ice chips/non mint and menthol containing candies (such as lifesavers or sugarless jolly ranchers) are also effective.  You should rest your voice and avoid activities that you know make you cough.  Once you have eliminated the cough for 3 straight days try reducing the Tylenol #3 first,  then the delsym as tolerated.       Protonix (pantoprazole)  40 mg Take 30-60 min before first meal of the day and Pepcid 20 mg one after supper    GERD (REFLUX)  is an extremely common cause of respiratory symptoms, many times with no significant heartburn at all.    It can be treated with medication, but also with lifestyle changes including avoidance of late meals, excessive alcohol, smoking cessation, and avoid fatty foods, chocolate, peppermint, colas, red wine, and acidic juices such as orange juice.  NO MINT OR MENTHOL PRODUCTS SO NO COUGH DROPS   USE HARD CANDY INSTEAD (jolley ranchers or Stover's or Lifesavers (all  available in sugarless versions) NO OIL BASED VITAMINS - use powdered substitutes.  We will  schedule sinus ct next avail and call you the results   Please schedule a follow up office visit in 2 weeks, sooner if needed

## 2019-09-10 NOTE — Assessment & Plan Note (Addendum)
Changed inderol to bisoprol 5 mg daily 05/14/2019 due to concerns re possible asthma   Adequate control on present rx, reviewed in detail with pt > no change in rx needed

## 2019-09-16 ENCOUNTER — Inpatient Hospital Stay: Admission: RE | Admit: 2019-09-16 | Payer: BC Managed Care – PPO | Source: Ambulatory Visit

## 2019-09-24 ENCOUNTER — Ambulatory Visit: Payer: BC Managed Care – PPO | Admitting: Internal Medicine

## 2019-09-24 LAB — HEPATIC FUNCTION PANEL
ALT: 16 (ref 7–35)
AST: 16 (ref 13–35)
Alkaline Phosphatase: 77 (ref 25–125)
Bilirubin, Direct: 0.09 (ref 0.01–0.4)
Bilirubin, Total: 0.3

## 2019-09-24 LAB — TSH: TSH: 1.49 (ref 0.41–5.90)

## 2019-10-01 ENCOUNTER — Ambulatory Visit (INDEPENDENT_AMBULATORY_CARE_PROVIDER_SITE_OTHER)
Admission: RE | Admit: 2019-10-01 | Discharge: 2019-10-01 | Disposition: A | Discharge status: Home / Self Care | Payer: BC Managed Care – PPO | Source: Ambulatory Visit | Attending: Internal Medicine | Admitting: Internal Medicine

## 2019-10-01 ENCOUNTER — Other Ambulatory Visit: Payer: Self-pay

## 2019-10-01 DIAGNOSIS — J45991 Cough variant asthma: Secondary | ICD-10-CM

## 2019-10-01 DIAGNOSIS — R05 Cough: Secondary | ICD-10-CM

## 2019-10-01 DIAGNOSIS — R059 Cough, unspecified: Secondary | ICD-10-CM

## 2019-10-02 ENCOUNTER — Telehealth: Payer: Self-pay | Admitting: Internal Medicine

## 2019-10-02 MED ORDER — AMOXICILLIN-POT CLAVULANATE 875-125 MG PO TABS: ORAL_TABLET | ORAL | 0 refills | Status: DC

## 2019-10-02 NOTE — Telephone Encounter (Signed)
Called and spoke with Patient.  Dr. Melvyn Novas results and recommendations given.  Understanding stated. Augmentin #40 sent to requested [facility] [place]. Patient scheduled follow up 10/26/19, at 2pm for OV with Dr. Melvyn Novas. Nothing further at this time.

## 2019-10-05 DIAGNOSIS — K319 Disease of stomach and duodenum, unspecified: Secondary | ICD-10-CM | POA: Insufficient documentation

## 2019-10-05 HISTORY — DX: Disease of stomach and duodenum, unspecified: K31.9

## 2019-10-08 ENCOUNTER — Encounter: Payer: Self-pay | Admitting: Family Medicine

## 2019-10-15 ENCOUNTER — Encounter: Payer: Self-pay | Admitting: Family Medicine

## 2019-10-16 ENCOUNTER — Telehealth: Payer: Self-pay | Admitting: Adult Health

## 2019-10-16 MED ORDER — BUDESONIDE-FORMOTEROL FUMARATE 80-4.5 MCG/ACT IN AERO: 2.0000 | INHALATION_SPRAY | Freq: Two times a day (BID) | RESPIRATORY_TRACT | 5 refills | Status: DC

## 2019-10-16 NOTE — Telephone Encounter (Signed)
Try symbicort 80 2bid and send coupon for 25 dollar copay

## 2019-10-16 NOTE — Telephone Encounter (Signed)
I called pt and advised her that we were switching her inhaler to Symbicort. Pt understood and will let us know if her insurance covers it and get back to [place]. Nothing further is needed.

## 2019-10-16 NOTE — Telephone Encounter (Signed)
Copied from [place] [id number]. Topic: General - Other >> Oct 16, 2019  9:29 AM Keene Breath wrote: Reason for CRM: Pharmacy called to request a new script for patients medication mometasone-formoterol (DULERA) 100-5 MCG/ACT AERO.  The insurance company will not cover this medication and would like an alternative script sent.  Please advise and call to discuss at [phone]

## 2019-10-16 NOTE — Telephone Encounter (Signed)
Let's try it anyway and give sample as she will be in for f/u soon and if it's not covered have her bring her formulary with her

## 2019-10-16 NOTE — Telephone Encounter (Addendum)
Per our system, Symbicort is not on the pt's formulary either.

## 2019-10-26 ENCOUNTER — Ambulatory Visit: Payer: BC Managed Care – PPO | Admitting: Internal Medicine

## 2019-11-19 ENCOUNTER — Telehealth: Payer: Self-pay | Admitting: Nurse Practitioner

## 2019-11-19 DIAGNOSIS — R002 Palpitations: Secondary | ICD-10-CM

## 2019-11-19 NOTE — Telephone Encounter (Signed)
Pt has not been seen since 09/2017. States she is calling in due to increased palpitations.  Occurs daily now, short spurts throughout the day, but "very frequent".  Reports "feels like its skipping". She was diagnosed with asthma this year and is taking Symbicort daily & albuterol PRN. She takes Bisoprolol daily, but she states this is for helping her HRs during panic attacks. Explained that she will most likely need a monitor before being seen.  Aware that Truitt Merle, NP is out of the office the rest of the week (provider she saw last) and may be next week before return call.   Explained that I don't want to make an appt to discuss yet b/c the monitor prior to appt may be the better plan. Pt agrees and understands it may be next week before return call w/ advisement

## 2019-11-19 NOTE — Telephone Encounter (Signed)
Ok to arrange for 14 day zio and then can arrange a follow up visit.   Would restrict caffeine use.   Burtis Junes, RN, [facility] [address] [place], [place]  [phone]

## 2019-11-19 NOTE — Telephone Encounter (Signed)
Patient c/o Palpitations:  High priority if patient c/o lightheadedness, shortness of breath, or chest pain  1) How long have you had palpitations/irregular HR/ Afib? Are you having the symptoms now? Yes off and on all day  2) Are you currently experiencing lightheadedness, SOB or CP? no  3) Do you have a history of afib (atrial fibrillation) or irregular heart rhythm? History Palpitations   4) Have you checked your BP or HR? (document readings if available): no  Are you experiencing any other symptoms? No

## 2019-11-20 ENCOUNTER — Telehealth: Payer: Self-pay

## 2019-11-20 NOTE — Telephone Encounter (Signed)
14 day ZIO ordered and mailed to pt.  

## 2019-11-20 NOTE — Telephone Encounter (Signed)
Made aware of recommendation.  Pt agreeable to plan. Pt aware office will call to arrange OV w/ Cecille Rubin post completed monitor  (will route to her assist to arrange follow up)

## 2019-12-01 ENCOUNTER — Ambulatory Visit (INDEPENDENT_AMBULATORY_CARE_PROVIDER_SITE_OTHER): Payer: BC Managed Care – PPO

## 2019-12-01 DIAGNOSIS — R002 Palpitations: Secondary | ICD-10-CM

## 2019-12-18 ENCOUNTER — Other Ambulatory Visit: Payer: Self-pay | Admitting: Internal Medicine

## 2019-12-18 DIAGNOSIS — J45991 Cough variant asthma: Secondary | ICD-10-CM

## 2020-01-25 ENCOUNTER — Ambulatory Visit: Payer: BC Managed Care – PPO | Attending: Internal Medicine

## 2020-01-25 DIAGNOSIS — Z20822 Contact with and (suspected) exposure to covid-19: Secondary | ICD-10-CM

## 2020-01-26 LAB — NOVEL CORONAVIRUS, NAA: SARS-CoV-2, NAA: NOT DETECTED

## 2020-02-25 ENCOUNTER — Telehealth: Payer: BC Managed Care – PPO | Admitting: Physician Assistant

## 2020-02-25 DIAGNOSIS — N3000 Acute cystitis without hematuria: Secondary | ICD-10-CM | POA: Diagnosis not present

## 2020-02-25 MED ORDER — CEPHALEXIN 500 MG PO CAPS: 500.0000 mg | ORAL_CAPSULE | Freq: Two times a day (BID) | ORAL | 0 refills | Status: AC

## 2020-02-25 NOTE — Progress Notes (Signed)

## 2020-03-03 ENCOUNTER — Other Ambulatory Visit: Payer: Self-pay | Admitting: Physician Assistant

## 2020-03-03 NOTE — Telephone Encounter (Signed)
Joanne Molina made appt for 03/07/20.  But she still need the Xanax refilled before the weeked.

## 2020-03-03 NOTE — Telephone Encounter (Signed)
Apt 03/08

## 2020-03-07 ENCOUNTER — Ambulatory Visit (INDEPENDENT_AMBULATORY_CARE_PROVIDER_SITE_OTHER): Payer: BC Managed Care – PPO | Admitting: Physician Assistant

## 2020-03-07 ENCOUNTER — Other Ambulatory Visit: Payer: Self-pay

## 2020-03-07 ENCOUNTER — Encounter: Payer: Self-pay | Admitting: Physician Assistant

## 2020-03-07 DIAGNOSIS — G4700 Insomnia, unspecified: Secondary | ICD-10-CM

## 2020-03-07 DIAGNOSIS — F3341 Major depressive disorder, recurrent, in partial remission: Secondary | ICD-10-CM

## 2020-03-07 DIAGNOSIS — F411 Generalized anxiety disorder: Secondary | ICD-10-CM

## 2020-03-07 DIAGNOSIS — F329 Major depressive disorder, single episode, unspecified: Secondary | ICD-10-CM

## 2020-03-07 MED ORDER — ALPRAZOLAM 1 MG PO TABS: ORAL_TABLET | ORAL | 0 refills | Status: DC

## 2020-03-07 MED ORDER — TRAZODONE HCL 50 MG PO TABS: 25.0000 mg | ORAL_TABLET | Freq: Every evening | ORAL | 1 refills | Status: DC | PRN

## 2020-03-07 MED ORDER — SERTRALINE HCL 100 MG PO TABS: 250.0000 mg | ORAL_TABLET | Freq: Every day | ORAL | 1 refills | Status: DC

## 2020-03-07 NOTE — Progress Notes (Signed)
[facility] Med Check  Patient ID: Joanne Molina,  MRN: [id number]  PCP: Dorothyann Peng, NP  Date of Evaluation: 03/07/2020 Time spent:20 minutes  Chief Complaint:  Chief Complaint    Anxiety; Depression; Insomnia      HISTORY/CURRENT STATUS: HPI For routine med check.  Her good friend from childhood committed suicide since the last visit.  "That's been really hard.  She took her own life."  She has had an uptake in the anxiety.  She has had a few panic attacks that "if I I did not know what they were, I would have needed to go to the emergency room.  I had chest pain and shortness of breath but I was able to use the things have learned in therapy to help me get through the panic attack.  The Ativan does help.  She has not been enjoying things as much as she would like to.  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  She still does not sleep well sometimes but the Lunesta does help.  It is not always effective however.  Appetite and weight are stable.  Does not cry easily for no reason.  No suicidal or homicidal thoughts.  Patient denies increased energy with decreased need for sleep, no increased talkativeness, no racing thoughts, no impulsivity or risky behaviors, no increased spending, no increased libido, no grandiosity.  Denies dizziness, syncope, seizures, numbness, tingling, tremor, tics, unsteady gait, slurred speech, confusion. Denies muscle or joint pain, stiffness, or dystonia.  Individual Medical History/ Review of Systems: Changes? :Yes  Had endoscopy for gastric ulcers.   Past medications for mental health diagnoses include: Sonata,Paxil, Celexa, Effexor, BuSpar made the anxiety worse, Ativan, Klonopin, Prozac, Xanax, Ambien  Allergies: Erythromycin  Current Medications:  Current Outpatient Medications:  .  albuterol (PROVENTIL HFA;VENTOLIN HFA) 108 (90  Base) MCG/ACT inhaler, Inhale 1 puff into the lungs every 6 (six) hours as needed for wheezing or shortness of breath., Disp: , Rfl:  .  ALPRAZolam (XANAX) 1 MG tablet, TAKE 1 TABLET BY MOUTH 2 TIMES DAILY AS NEEDED FOR ANXIETY., Disp: 60 tablet, Rfl: 0 .  bisoprolol (ZEBETA) 5 MG tablet, Take 1 tablet (5 mg total) by mouth daily. (Patient taking differently: Take 5 mg by mouth daily. ), Disp: 30 tablet, Rfl: 11 .  budesonide-formoterol (SYMBICORT) 80-4.5 MCG/ACT inhaler, Inhale 2 puffs into the lungs 2 (two) times daily., Disp: 1 Inhaler, Rfl: 5 .  Eszopiclone 3 MG TABS, Take 1 tablet (3 mg total) by mouth at bedtime. Take immediately before bedtime, Disp: 30 tablet, Rfl: 5 .  ISOtretinoin (ACCUTANE) 20 MG capsule, Take 20 mg by mouth daily., Disp: , Rfl:  .  mupirocin ointment (BACTROBAN) 2 %, Apply 1 application topically daily as needed (breakouts on face)., Disp: , Rfl:  .  acetaminophen-codeine (TYLENOL #3) 300-30 MG tablet, Take 1-2 tablets by mouth every 4 (four) hours as needed (cough). (Patient not taking: Reported on 03/07/2020), Disp: 40 tablet, Rfl: 0 .  amoxicillin-clavulanate (AUGMENTIN) 875-125 MG tablet, Take 1 tab at breakfast and 1 tab with supper, take with  full glass of water (Patient not taking: Reported on 03/07/2020), Disp: 40 tablet, Rfl: 0 .  azithromycin (ZITHROMAX Z-PAK) 250 MG tablet, Take 2 tablets on Day 1.  Then take 1 tablet daily. (Patient not taking: Reported on 03/07/2020), Disp: 6 tablet, Rfl: 0 .  DEXILANT 60 MG  capsule, Take 1 capsule by mouth daily., Disp: , Rfl:  .  famotidine (PEPCID) 20 MG tablet, One after supper (Patient not taking: Reported on 03/07/2020), Disp: 30 tablet, Rfl: 11 .  pantoprazole (PROTONIX) 40 MG tablet, TAKE 1 TABLET (40 MG TOTAL) BY MOUTH DAILY. TAKE 30-60 MIN BEFORE FIRST MEAL OF THE DAY (Patient not taking: Reported on 03/07/2020), Disp: 90 tablet, Rfl: 2 .  predniSONE (DELTASONE) 10 MG tablet, 40 mg x 3 days, 20 mg x 3 days, 10 mg x 3 days  (Patient not taking: Reported on 03/07/2020), Disp: 21 tablet, Rfl: 0 .  sertraline (ZOLOFT) 100 MG tablet, Take 2.5 tablets (250 mg total) by mouth daily., Disp: 225 tablet, Rfl: 1 .  traZODone (DESYREL) 50 MG tablet, Take 0.5-2 tablets (25-100 mg total) by mouth at bedtime as needed for sleep., Disp: 60 tablet, Rfl: 1 Medication Side Effects: none  Family Medical/ Social History: Changes? No  MENTAL HEALTH EXAM:  Last menstrual period 06/02/2019, unknown if currently breastfeeding.There is no height or weight on file to calculate BMI.  General Appearance: Casual, Neat and Well Groomed  Eye Contact:  Good  Speech:  Clear and Coherent and Normal Rate  Volume:  Normal  Mood:  Anxious  Affect:  Tearful and Anxious she is consolable.  She became tearful when talking about her friend's death  Thought Process:  Goal Directed and Descriptions of Associations: Intact  Orientation:  Full (Time, Place, and Person)  Thought Content: Logical   Suicidal Thoughts:  No  Homicidal Thoughts:  No  Memory:  WNL  Judgement:  Good  Insight:  Good  Psychomotor Activity:  Normal  Concentration:  Concentration: Good  Recall:  Good  Fund of Knowledge: Good  Language: Good  Assets:  Desire for Improvement  ADL's:  Intact  Cognition: WNL  Prognosis:  Good    DIAGNOSES:    ICD-10-CM   1. Major depressive disorder with current active episode, unspecified depression episode severity, unspecified whether recurrent  F32.9   2. Recurrent major depressive disorder, in partial remission (HCC)  F33.41   3. Insomnia, unspecified type  G47.00   4. Generalized anxiety disorder  F41.1     Receiving Psychotherapy: No    RECOMMENDATIONS:  PDMP was reviewed. I spent 20 minutes with her. Increase Zoloft 100 mg to 2.5 pills daily. Start trazodone 50 mg, 1/2-2 p.o. nightly as needed sleep. Continue Lunesta 3 mg, 1 at bedtime as needed. Continue Xanax 1 mg 1 p.o. twice daily as needed anxiety. Consider  counseling. Return in 4 to 6 weeks.  Melony Overly, PA-C

## 2020-03-29 ENCOUNTER — Other Ambulatory Visit: Payer: Self-pay | Admitting: Physician Assistant

## 2020-04-06 ENCOUNTER — Ambulatory Visit: Payer: BC Managed Care – PPO | Admitting: Physician Assistant

## 2020-04-06 ENCOUNTER — Ambulatory Visit: Payer: BC Managed Care – PPO | Admitting: Adult Health

## 2020-04-12 ENCOUNTER — Encounter: Payer: Self-pay | Admitting: Adult Health

## 2020-04-12 ENCOUNTER — Other Ambulatory Visit: Payer: Self-pay

## 2020-04-12 ENCOUNTER — Ambulatory Visit (INDEPENDENT_AMBULATORY_CARE_PROVIDER_SITE_OTHER): Payer: BC Managed Care – PPO | Admitting: Adult Health

## 2020-04-12 VITALS — BP 130/86 | Temp 98.3°F | Wt 188.0 lb

## 2020-04-12 DIAGNOSIS — M5441 Lumbago with sciatica, right side: Secondary | ICD-10-CM | POA: Diagnosis not present

## 2020-04-12 MED ORDER — CYCLOBENZAPRINE HCL 10 MG PO TABS: 10.0000 mg | ORAL_TABLET | Freq: Three times a day (TID) | ORAL | 0 refills | Status: DC | PRN

## 2020-04-12 MED ORDER — METHYLPREDNISOLONE 4 MG PO TBPK: ORAL_TABLET | ORAL | 0 refills | Status: DC
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
## 2020-04-15 ENCOUNTER — Telehealth: Payer: BC Managed Care – PPO | Admitting: Nurse Practitioner

## 2020-04-15 DIAGNOSIS — N390 Urinary tract infection, site not specified: Secondary | ICD-10-CM

## 2020-04-15 MED ORDER — CEPHALEXIN 500 MG PO CAPS: 500.0000 mg | ORAL_CAPSULE | Freq: Two times a day (BID) | ORAL | 0 refills | Status: AC

## 2020-04-15 NOTE — Progress Notes (Signed)
We are sorry that you are not feeling well.  Here is how we plan to help!  Based on what you shared with me it looks like you most likely have a simple urinary tract infection.  A UTI (Urinary Tract Infection) is a bacterial infection of the bladder.  Most cases of urinary tract infections are simple to treat but a key part of your care is to encourage you to drink plenty of fluids and watch your symptoms carefully. In review of your chart, it appears your last UTI was in February.  If they become recurrent, you will need to follow up with your PCP for a urine culture.  I have prescribed Keflex 500 mg twice a day for 7 days.  Your symptoms should gradually improve. Call us if the burning in your urine worsens, you develop worsening fever, back pain or pelvic pain or if your symptoms do not resolve after completing the antibiotic.  Urinary tract infections can be prevented by drinking plenty of water to keep your body hydrated.  Also be sure when you wipe, wipe from front to back and don't hold it in!  If possible, empty your bladder every 4 hours.  Your e-visit answers were reviewed by a board certified advanced clinical practitioner to complete your personal care plan.  Depending on the condition, your plan could have included both over the counter or prescription medications.  If there is a problem please reply  once you have received a response from your provider.  Your safety is important to [place].  If you have drug allergies check your prescription carefully.    You can use MyChart to ask questions about today's visit, request a non-urgent call back, or ask for a work or school excuse for 24 hours related to this e-Visit. If it has been greater than 24 hours you will need to follow up with your provider, or enter a new e-Visit to address those concerns.   You will get an e-mail in the next two days asking about your experience.  I hope that your e-visit has been valuable and will speed your  recovery. Thank you for using e-visits.  I have spent at least 5 minutes reviewing and documenting in the patient's chart.

## 2020-04-18 ENCOUNTER — Ambulatory Visit: Payer: BC Managed Care – PPO | Admitting: Physician Assistant

## 2020-05-03 ENCOUNTER — Other Ambulatory Visit: Payer: Self-pay | Admitting: Physician Assistant

## 2020-05-04 ENCOUNTER — Other Ambulatory Visit: Payer: Self-pay

## 2020-05-17 ENCOUNTER — Other Ambulatory Visit: Payer: Self-pay | Admitting: Internal Medicine

## 2020-06-17 ENCOUNTER — Telehealth: Payer: Self-pay | Admitting: Internal Medicine

## 2020-06-17 NOTE — Telephone Encounter (Signed)
Spoke with patient. She stated that she has noticed that she goes from a sitting to standing position she feels slightly lightheaded. She will break the tablet in half starting tomorrow to see if she can tell a difference.   I advised her if the symptoms do not go away or increase over the weekend to go to the nearest UC or ED. She verbalized understanding.   Nothing further needed at time of call.

## 2020-06-17 NOTE — Telephone Encounter (Signed)
Called and spoke with pt. Pt stated that her heart rate has been going to 60s-50s at night and she is concerned that this may be too low. She stated prior to being put on bisoprolol, she was on propranolol which she was placed on due to having anxiety which when she has an attack, her heart rate would go up to 130s. Pt stated that MW took her off of propranolol and put her on bisoprolol due to her breathing.  Pt is wanting recommendations due to meds and heart rate. Dr. Melvyn Novas, please advise.  Assessment & Plan Note by Tanda Rockers, MD at 09/10/2019 5:08 PM Author: Tanda Rockers, MD Author Type: Physician Filed: 09/10/2019 5:08 PM  Note Status: Bernell List: Cosign Not Required Encounter Date: 09/10/2019  Problem: Essential hypertension  Editor: Tanda Rockers, MD (Physician)      Prior Versions: 1. Tanda Rockers, MD (Physician) at 09/10/2019 5:08 PM - Written      Changed inderol to bisoprol 5 mg daily 05/14/2019 due to concerns re possible asthma   Adequate control on present rx, reviewed in detail with pt > no change in rx needed      Patient Instructions by Tanda Rockers, MD at 09/10/2019 4:00 PM Author: Tanda Rockers, MD Author Type: Physician Filed: 09/10/2019 4:55 PM  Note Status: Addendum Cosign: Cosign Not Required Encounter Date: 09/10/2019  Editor: Tanda Rockers, MD (Physician)      Prior Versions: 1. Tanda Rockers, MD (Physician) at 09/10/2019 4:44 PM - Signed      Plan A = Automatic = Dulera 100 1-2 puffs every 12 hours   Plan B = Backup Only use your albuterol inhaler as a rescue medication to be used if you can't catch your breath by resting or doing a relaxed purse lip breathing pattern.  - The less you use it, the better it will work when you need it. - Ok to use the inhaler up to 2 puffs  every 4 hours if you must but call for appointment if use goes up over your usual need - Don't leave home without it !!  (think of it like the spare tire for your car)    Finish prednisone and zpak   The key to effective treatment for your cough is eliminating the non-stop cycle of cough you're stuck in long enough to let your airway heal completely and then see if there is anything still making you cough once you stop the cough suppression, but this should take no more than 5 days to figure out  Take delsym two tsp every 12 hours and supplement if needed with  Tylenol #3   up to 1-2 every 4 hours to suppress the urge to cough. Swallowing water and/or using ice chips/non mint and menthol containing candies (such as lifesavers or sugarless jolly ranchers) are also effective.  You should rest your voice and avoid activities that you know make you cough.  Once you have eliminated the cough for 3 straight days try reducing the Tylenol #3 first,  then the delsym as tolerated.       Protonix (pantoprazole)  40 mg Take 30-60 min before first meal of the day and Pepcid 20 mg one after supper    GERD (REFLUX)  is an extremely common cause of respiratory symptoms, many times with no significant heartburn at all.    It can be treated with medication, but also with lifestyle changes including avoidance of late meals,  excessive alcohol, smoking cessation, and avoid fatty foods, chocolate, peppermint, colas, red wine, and acidic juices such as orange juice.  NO MINT OR MENTHOL PRODUCTS SO NO COUGH DROPS   USE HARD CANDY INSTEAD (jolley ranchers or Stover's or Lifesavers (all available in sugarless versions) NO OIL BASED VITAMINS - use powdered substitutes.  We will  schedule sinus ct next avail and call you the results   Please schedule a follow up office visit in 2 weeks, sooner if needed

## 2020-06-17 NOTE — Telephone Encounter (Signed)
50-60's is perfectly fine and what  We would  expect from an effectiv beta blocker - if having symptoms though (light headed standing, for example)  should be able to just break the pill in half and eliminate the problem - but if not better will  need ov with me or PCP to sort out what's best  but would not go back on Inderal as it risks making breathing worse.

## 2020-06-23 ENCOUNTER — Telehealth: Payer: BC Managed Care – PPO | Admitting: Physician Assistant

## 2020-06-23 DIAGNOSIS — R399 Unspecified symptoms and signs involving the genitourinary system: Secondary | ICD-10-CM

## 2020-06-23 MED ORDER — NITROFURANTOIN MONOHYD MACRO 100 MG PO CAPS: 100.0000 mg | ORAL_CAPSULE | Freq: Two times a day (BID) | ORAL | 0 refills | Status: AC

## 2020-06-23 NOTE — Progress Notes (Signed)
Hi Joanne Molina,   I am sorry you are not feeling well.  I will treat you today, but please schedule an appointment with your PCP to discuss the frequency of your UTI symptoms. I see this is your 3rd E-visit for UTI symptoms this year.  You should start providing urine samples to be sure this is in fact an established UTI, and you may need an evaluation by a urologist.   We are sorry that you are not feeling well.  Here is how we plan to help!  Based on what you shared with me it looks like you most likely have a simple urinary tract infection.  A UTI (Urinary Tract Infection) is a bacterial infection of the bladder.  Most cases of urinary tract infections are simple to treat but a key part of your care is to encourage you to drink plenty of fluids and watch your symptoms carefully.  I have prescribed MacroBid 100 mg twice a day for 5 days.  Your symptoms should gradually improve. Call us if the burning in your urine worsens, you develop worsening fever, back pain or pelvic pain or if your symptoms do not resolve after completing the antibiotic.  Urinary tract infections can be prevented by drinking plenty of water to keep your body hydrated.  Also be sure when you wipe, wipe from front to back and don't hold it in!  If possible, empty your bladder every 4 hours.  Your e-visit answers were reviewed by a board certified advanced clinical practitioner to complete your personal care plan.  Depending on the condition, your plan could have included both over the counter or prescription medications.  If there is a problem please reply  once you have received a response from your provider.  Your safety is important to [place].  If you have drug allergies check your prescription carefully.    You can use MyChart to ask questions about today's visit, request a non-urgent call back, or ask for a work or school excuse for 24 hours related to this e-Visit. If it has been greater than 24 hours you will need to  follow up with your provider, or enter a new e-Visit to address those concerns.   You will get an e-mail in the next two days asking about your experience.  I hope that your e-visit has been valuable and will speed your recovery. Thank you for using e-visits.   Greater than 5 minutes, yet less than 10 minutes of time have been spent researching, coordinating and implementing care for this patient today.

## 2020-06-28 ENCOUNTER — Other Ambulatory Visit: Payer: Self-pay | Admitting: Physician Assistant

## 2020-06-28 NOTE — Telephone Encounter (Signed)
Hasn't f/up since March

## 2020-07-03 ENCOUNTER — Other Ambulatory Visit: Payer: Self-pay | Admitting: Physician Assistant

## 2020-07-05 ENCOUNTER — Telehealth: Payer: Self-pay | Admitting: Physician Assistant

## 2020-07-05 ENCOUNTER — Other Ambulatory Visit: Payer: Self-pay

## 2020-07-05 MED ORDER — ALPRAZOLAM 1 MG PO TABS: ORAL_TABLET | ORAL | 0 refills | Status: DC

## 2020-07-05 NOTE — Telephone Encounter (Signed)
Pt made appt 7/27. Can she get her RF of Xanax? [facility] [place] [place]

## 2020-07-05 NOTE — Telephone Encounter (Signed)
Last refill 05/05/2020, pended for Joanne Molina to approved and send

## 2020-07-13 ENCOUNTER — Other Ambulatory Visit: Payer: Self-pay

## 2020-07-13 ENCOUNTER — Ambulatory Visit: Payer: BC Managed Care – PPO | Admitting: Adult Health

## 2020-07-13 ENCOUNTER — Encounter: Payer: Self-pay | Admitting: Adult Health

## 2020-07-13 VITALS — BP 116/82 | Temp 98.1°F | Wt 186.0 lb

## 2020-07-13 DIAGNOSIS — M542 Cervicalgia: Secondary | ICD-10-CM | POA: Diagnosis not present

## 2020-07-13 DIAGNOSIS — M5416 Radiculopathy, lumbar region: Secondary | ICD-10-CM

## 2020-07-13 MED ORDER — PREDNISONE 10 MG PO TABS: ORAL_TABLET | ORAL | 0 refills | Status: DC

## 2020-07-13 MED ORDER — CYCLOBENZAPRINE HCL 10 MG PO TABS: 10.0000 mg | ORAL_TABLET | Freq: Every day | ORAL | 0 refills | Status: DC
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
## 2020-07-25 ENCOUNTER — Ambulatory Visit: Payer: BC Managed Care – PPO | Admitting: Physical Therapy

## 2020-07-26 ENCOUNTER — Ambulatory Visit: Payer: BC Managed Care – PPO | Admitting: Physician Assistant

## 2020-08-12 ENCOUNTER — Other Ambulatory Visit: Payer: Self-pay

## 2020-08-12 ENCOUNTER — Ambulatory Visit
Admission: RE | Admit: 2020-08-12 | Discharge: 2020-08-12 | Disposition: A | Discharge status: Home / Self Care | Payer: BC Managed Care – PPO | Source: Ambulatory Visit | Attending: Adult Health | Admitting: Adult Health

## 2020-08-12 DIAGNOSIS — M542 Cervicalgia: Secondary | ICD-10-CM

## 2020-08-12 DIAGNOSIS — M5416 Radiculopathy, lumbar region: Secondary | ICD-10-CM

## 2020-08-13 ENCOUNTER — Other Ambulatory Visit: Payer: Self-pay | Admitting: Physician Assistant

## 2020-08-15 NOTE — Telephone Encounter (Signed)
Apt 08/19 

## 2020-08-16 ENCOUNTER — Encounter: Payer: Self-pay | Admitting: Adult Health

## 2020-08-18 ENCOUNTER — Ambulatory Visit: Payer: BC Managed Care – PPO | Admitting: Physician Assistant

## 2020-08-23 ENCOUNTER — Other Ambulatory Visit: Payer: Self-pay | Admitting: Adult Health

## 2020-08-23 DIAGNOSIS — M542 Cervicalgia: Secondary | ICD-10-CM

## 2020-08-31 ENCOUNTER — Other Ambulatory Visit: Payer: Self-pay | Admitting: Physician Assistant

## 2020-08-31 ENCOUNTER — Telehealth: Payer: Self-pay | Admitting: Physician Assistant

## 2020-08-31 MED ORDER — ALPRAZOLAM 1 MG PO TABS: 1.0000 mg | ORAL_TABLET | Freq: Two times a day (BID) | ORAL | 0 refills | Status: DC | PRN

## 2020-08-31 NOTE — Telephone Encounter (Signed)
Pt requesting refill for Xanax @ [facility] [place] [place]. Last apt  03/07/20. canc or no show all apt after. NEXT APT 10/5

## 2020-08-31 NOTE — Telephone Encounter (Signed)
Prescription was sent.  If she no-shows or misses 1 more appointment, she will receive a warning letter that she may be terminated from the practice for failure to keep appointments.

## 2020-08-31 NOTE — Telephone Encounter (Signed)
Last apt 03/07/2020 Now scheduled 10/04/20  NS 08/19 Can 07/27 NS/Can 04/19 Can 04/07

## 2020-09-02 DIAGNOSIS — M4802 Spinal stenosis, cervical region: Secondary | ICD-10-CM | POA: Insufficient documentation

## 2020-09-02 DIAGNOSIS — M5416 Radiculopathy, lumbar region: Secondary | ICD-10-CM | POA: Insufficient documentation

## 2020-09-27 ENCOUNTER — Other Ambulatory Visit: Payer: Self-pay | Admitting: Physician Assistant

## 2020-09-27 NOTE — Telephone Encounter (Signed)
review 

## 2020-10-04 ENCOUNTER — Telehealth: Payer: Self-pay | Admitting: Physician Assistant

## 2020-10-04 ENCOUNTER — Telehealth (INDEPENDENT_AMBULATORY_CARE_PROVIDER_SITE_OTHER): Payer: BC Managed Care – PPO | Admitting: Physician Assistant

## 2020-10-04 ENCOUNTER — Encounter: Payer: Self-pay | Admitting: Physician Assistant

## 2020-10-04 ENCOUNTER — Other Ambulatory Visit: Payer: Self-pay | Admitting: Physician Assistant

## 2020-10-04 DIAGNOSIS — F3341 Major depressive disorder, recurrent, in partial remission: Secondary | ICD-10-CM

## 2020-10-04 DIAGNOSIS — G4700 Insomnia, unspecified: Secondary | ICD-10-CM | POA: Diagnosis not present

## 2020-10-04 DIAGNOSIS — F411 Generalized anxiety disorder: Secondary | ICD-10-CM | POA: Diagnosis not present

## 2020-10-04 MED ORDER — TRAZODONE HCL 100 MG PO TABS: 100.0000 mg | ORAL_TABLET | Freq: Every evening | ORAL | 0 refills | Status: DC | PRN

## 2020-10-04 MED ORDER — ESZOPICLONE 2 MG PO TABS: 2.0000 mg | ORAL_TABLET | Freq: Every evening | ORAL | 0 refills | Status: DC | PRN

## 2020-10-04 MED ORDER — ALPRAZOLAM 1 MG PO TABS: 1.0000 mg | ORAL_TABLET | Freq: Two times a day (BID) | ORAL | 5 refills | Status: DC | PRN

## 2020-10-04 NOTE — Telephone Encounter (Signed)
Apt today at 3 pm

## 2020-10-04 NOTE — Telephone Encounter (Signed)
Ms. latravia, [place] are scheduled for a virtual visit with your provider today.    Just as we do with appointments in the office, we must obtain your consent to participate.  Your consent will be active for this visit and any virtual visit you may have with one of our providers in the next 365 days.    If you have a MyChart account, I can also send a copy of this consent to you electronically.  All virtual visits are billed to your insurance company just like a traditional visit in the office.  As this is a virtual visit, video technology does not allow for your provider to perform a traditional examination.  This may limit your provider's ability to fully assess your condition.  If your provider identifies any concerns that need to be evaluated in person or the need to arrange testing such as labs, EKG, etc, we will make arrangements to do so.    Although advances in technology are sophisticated, we cannot ensure that it will always work on either your end or our end.  If the connection with a video visit is poor, we may have to switch to a telephone visit.  With either a video or telephone visit, we are not always able to ensure that we have a secure connection.   I need to obtain your verbal consent now.   Are you willing to proceed with your visit today?   HALLEIGH COMES has provided verbal consent on 10/04/2020 for a virtual visit (video or telephone).   Melony Overly, PA-C 10/04/2020  3:37 PM

## 2020-10-04 NOTE — Progress Notes (Signed)
[facility] Check  Patient ID: Joanne Molina,  MRN: [id number]  PCP: Joanne Frees, NP  Date of Evaluation: 10/5//2021 Time spent:20 minutes  Chief Complaint:  Chief Complaint    Anxiety; Depression; Insomnia     Virtual Visit via Telehealth  I connected with patient by a video enabled telemedicine application with their informed consent, and verified patient privacy and that I am speaking with the correct person using two identifiers.  I am private, in my office and the patient is at home.  I discussed the limitations, risks, security and privacy concerns of performing an evaluation and management service by video and the availability of in person appointments. I also discussed with the patient that there may be a patient responsible charge related to this service. The patient expressed understanding and agreed to proceed.   I discussed the assessment and treatment plan with the patient. The patient was provided an opportunity to ask questions and all were answered. The patient agreed with the plan and demonstrated an understanding of the instructions.   The patient was advised to call back or seek an in-person evaluation if the symptoms worsen or if the condition fails to improve as anticipated.  I provided 20 minutes of non-face-to-face time during this encounter.  HISTORY/CURRENT STATUS: HPI For routine med check.  States she is she has a herniated cervical disc that is pressing on her spinal cord and will need surgery for that.  [name] planning to have it done hopefully in December when she is out of work for a month.  [name] a professor at [employer].  She still has a lot of trouble sleeping.  Both going to sleep and staying asleep.  The Xanax helps her relax some to go to sleep.  She performs good sleep hygiene already.  She has tried [place], Ambien, and [place].  The Alfonso Patten has helped more than anything.  [name] able to enjoy things.  Energy and motivation  are good.  Appetite and weight are stable.  Does not cry easily for no reason.  No suicidal or homicidal thoughts.  Patient denies increased energy with decreased need for sleep, no increased talkativeness, no racing thoughts, no impulsivity or risky behaviors, no increased spending, no increased libido, no grandiosity.  Denies dizziness, syncope, seizures, numbness, tingling, tremor, tics, unsteady gait, slurred speech, confusion. Denies muscle or joint pain, stiffness, or dystonia.  Individual Medical History/ Review of Systems: Changes? :Yes  See HPI  Past medications for mental health diagnoses include: Sonata,Paxil, Celexa, Effexor, BuSpar made the anxiety worse, Ativan, Klonopin, Prozac, Xanax, Ambien, Lunesta, trazodone  Allergies: Erythromycin  Current Medications:  Current Outpatient Medications:    albuterol (PROVENTIL HFA;VENTOLIN HFA) 108 (90 Base) MCG/ACT inhaler, Inhale 1 puff into the lungs every 6 (six) hours as needed for wheezing or shortness of breath., Disp: , Rfl:    ALPRAZolam (XANAX) 1 MG tablet, Take 1 tablet (1 mg total) by mouth 2 (two) times daily as needed for anxiety., Disp: 60 tablet, Rfl: 5   bisoprolol (ZEBETA) 5 MG tablet, Take 1 tablet (5 mg total) by mouth daily. (Patient taking differently: Take 5 mg by mouth daily. ), Disp: 30 tablet, Rfl: 11   budesonide-formoterol (SYMBICORT) 80-4.5 MCG/ACT inhaler, Inhale 2 puffs into the lungs 2 (two) times daily., Disp: 1 Inhaler, Rfl: 5   DEXILANT 60 MG capsule, Take 1 capsule by mouth daily., Disp: , Rfl:    gabapentin (NEURONTIN) 300 MG capsule, Take 300 mg by mouth  3 (three) times daily., Disp: , Rfl:    ISOtretinoin (ACCUTANE) 20 MG capsule, Take 20 mg by mouth daily., Disp: , Rfl:    cyclobenzaprine (FLEXERIL) 10 MG tablet, Take 1 tablet (10 mg total) by mouth at bedtime. (Patient not taking: Reported on 10/04/2020), Disp: 30 tablet, Rfl: 0   eszopiclone (LUNESTA) 2 MG TABS tablet, Take 1 tablet (2 mg  total) by mouth at bedtime as needed for sleep. Take immediately before bedtime, Disp: 30 tablet, Rfl: 0   mupirocin ointment (BACTROBAN) 2 %, Apply 1 application topically daily as needed (breakouts on face). (Patient not taking: Reported on 10/04/2020), Disp: , Rfl:    predniSONE (DELTASONE) 10 MG tablet, 40 mg x 3 days, 20 mg x 3 days, 10 mg x 3 days (Patient not taking: Reported on 10/04/2020), Disp: 21 tablet, Rfl: 0   sertraline (ZOLOFT) 100 MG tablet, TAKE 2.5 TABLETS (250 MG TOTAL) BY MOUTH DAILY., Disp: 225 tablet, Rfl: 1   traZODone (DESYREL) 100 MG tablet, Take 1-2 tablets (100-200 mg total) by mouth at bedtime as needed for sleep., Disp: 60 tablet, Rfl: 0 Medication Side Effects: none  Family Medical/ Social History: Changes? No  MENTAL HEALTH EXAM:  Last menstrual period 06/02/2019, unknown if currently breastfeeding.There is no height or weight on file to calculate BMI.  General Appearance: Casual, Neat and Well Groomed  Eye Contact:  Good  Speech:  Clear and Coherent and Normal Rate  Volume:  Normal  Mood:  Euthymic  Affect:  Appropriate   Thought Process:  Goal Directed and Descriptions of Associations: Intact  Orientation:  Full (Time, Place, and Person)  Thought Content: Logical   Suicidal Thoughts:  No  Homicidal Thoughts:  No  Memory:  WNL  Judgement:  Good  Insight:  Good  Psychomotor Activity:  Normal  Concentration:  Concentration: Good  Recall:  Good  Fund of Knowledge: Good  Language: Good  Assets:  Desire for Improvement  ADL's:  Intact  Cognition: WNL  Prognosis:  Good    DIAGNOSES:    ICD-10-CM   1. Recurrent major depressive disorder, in partial remission (HCC)  F33.41   2. Generalized anxiety disorder  F41.1   3. Insomnia, unspecified type  G47.00     Receiving Psychotherapy: No    RECOMMENDATIONS:  PDMP was reviewed. I provided 20 minutes of nonface-to-face time during this encounter. We discussed the insomnia.  [name] already  practicing good sleep hygiene.  I recommend increasing the trazodone.  If this is ineffective, would recommend either mirtazapine, Belsomra, Dayvigo, or ZolpiMist. Discontinue Lunesta. Continue Zoloft 100 mg, 2.5 pills daily. Increase trazodone to 100 mg, 1-2 p.o. nightly as needed sleep. Continue Xanax 1 mg 1 p.o. twice daily as needed anxiety. Return in 4 to 6 weeks.  If she is unable to be seen due to her surgery, I understand.  She definitely needs to be seen within 3 months though.  Melony Overly, PA-C

## 2020-10-04 NOTE — Telephone Encounter (Signed)
Joanne Molina, Please let her know that after her appointment I reviewed her chart further and she has been on Lunesta in the past.  So for the insomnia, I am increasing the trazodone dose that I am sending in.  She takes that as needed. Please call the pharmacy and cancel that prescription for Lunesta.  [facility] [address]  Thank you

## 2020-10-05 NOTE — Telephone Encounter (Signed)
Spoke with pharmacy and canceled the Rx on her Lunesta.

## 2020-10-05 NOTE — Telephone Encounter (Signed)
Did you let the patient know also?  Just double checking.  Thanks.

## 2020-10-10 NOTE — Telephone Encounter (Signed)
Yes

## 2020-10-28 ENCOUNTER — Other Ambulatory Visit: Payer: Self-pay | Admitting: Internal Medicine

## 2020-10-30 ENCOUNTER — Telehealth: Payer: BC Managed Care – PPO | Admitting: Physician Assistant

## 2020-10-30 ENCOUNTER — Encounter: Payer: Self-pay | Admitting: Physician Assistant

## 2020-10-30 DIAGNOSIS — N3000 Acute cystitis without hematuria: Secondary | ICD-10-CM

## 2020-10-30 MED ORDER — NITROFURANTOIN MONOHYD MACRO 100 MG PO CAPS: 100.0000 mg | ORAL_CAPSULE | Freq: Two times a day (BID) | ORAL | 0 refills | Status: DC

## 2020-10-30 NOTE — Progress Notes (Signed)
We are sorry that you are not feeling well.  Here is how we plan to help!  Based on what you shared with me it looks like you most likely have a simple urinary tract infection.  A UTI (Urinary Tract Infection) is a bacterial infection of the bladder.  Most cases of urinary tract infections are simple to treat but a key part of your care is to encourage you to drink plenty of fluids and watch your symptoms carefully.  I have prescribed MacroBid 100 mg twice a day for 5 days.  Your symptoms should gradually improve. Call us if the burning in your urine worsens, you develop worsening fever, back pain or pelvic pain or if your symptoms do not resolve after completing the antibiotic.  Urinary tract infections can be prevented by drinking plenty of water to keep your body hydrated.  Also be sure when you wipe, wipe from front to back and don't hold it in!  If possible, empty your bladder every 4 hours.  Your e-visit answers were reviewed by a board certified advanced clinical practitioner to complete your personal care plan.  Depending on the condition, your plan could have included both over the counter or prescription medications.  If there is a problem please reply  once you have received a response from your provider.  Your safety is important to us.  If you have drug allergies check your prescription carefully.    You can use MyChart to ask questions about today's visit, request a non-urgent call back, or ask for a work or school excuse for 24 hours related to this e-Visit. If it has been greater than 24 hours you will need to follow up with your provider, or enter a new e-Visit to address those concerns.   You will get an e-mail in the next two days asking about your experience.  I hope that your e-visit has been valuable and will speed your recovery. Thank you for using e-visits.   I spent 5-10 minutes on review and completion of this note- Aynsley Fleet PAC  

## 2020-11-04 DIAGNOSIS — R030 Elevated blood-pressure reading, without diagnosis of hypertension: Secondary | ICD-10-CM | POA: Insufficient documentation

## 2020-11-29 ENCOUNTER — Other Ambulatory Visit: Payer: Self-pay

## 2020-11-29 ENCOUNTER — Ambulatory Visit (INDEPENDENT_AMBULATORY_CARE_PROVIDER_SITE_OTHER): Payer: BC Managed Care – PPO | Admitting: Pulmonary Disease

## 2020-11-29 ENCOUNTER — Encounter: Payer: Self-pay | Admitting: Pulmonary Disease

## 2020-11-29 VITALS — BP 118/76 | HR 106 | Temp 98.8°F | Ht 67.0 in | Wt 184.0 lb

## 2020-11-29 DIAGNOSIS — J45991 Cough variant asthma: Secondary | ICD-10-CM | POA: Diagnosis not present

## 2020-11-29 MED ORDER — ALBUTEROL SULFATE HFA 108 (90 BASE) MCG/ACT IN AERS: 1.0000 | INHALATION_SPRAY | Freq: Four times a day (QID) | RESPIRATORY_TRACT | 11 refills | Status: DC | PRN

## 2020-11-29 MED ORDER — BUDESONIDE-FORMOTEROL FUMARATE 80-4.5 MCG/ACT IN AERO: 2.0000 | INHALATION_SPRAY | Freq: Two times a day (BID) | RESPIRATORY_TRACT | 11 refills | Status: DC

## 2020-11-29 MED ORDER — BISOPROLOL FUMARATE 5 MG PO TABS
5.0000 mg | ORAL_TABLET | Freq: Every day | ORAL | 0 refills | Status: DC
Start: 2020-11-29 — End: 2021-01-03

## 2020-11-29 MED ORDER — DEXILANT 60 MG PO CPDR: 1.0000 | DELAYED_RELEASE_CAPSULE | Freq: Every day | ORAL | 11 refills | Status: DC

## 2020-11-29 NOTE — Patient Instructions (Addendum)
You were seen today by Coral Ceo, NP  for:   1. Cough variant asthma  Continue Symbicort 80 >>> 1 to 2 puffs in the evening rinse mouth out after >>> Take this daily, no matter what >>> This is not a rescue inhaler   We will continue medications as prescribed in our office  Plan as discussed today would recommend having bisoprolol be eventually represcribed by primary care   Follow Up:    Return in about 1 year (around 11/29/2021), or if symptoms worsen or fail to improve, for Follow up with Dr. Sherene Sires.   Notification of test results are managed in the following manner: If there are  any recommendations or changes to the  plan of care discussed in office today,  we will contact you and let you know what they are. If you do not hear from [place], then your results are normal and you can view them through your  MyChart account , or a letter will be sent to you. Thank you again for trusting [place] with your care  - Thank you, [facility]    It is flu season:   >>> Best ways to protect herself from the flu: Receive the yearly flu vaccine, practice good hand hygiene washing with soap and also using hand sanitizer when available, eat a nutritious meals, get adequate rest, hydrate appropriately       Please contact the office if your symptoms worsen or you have concerns that you are not improving.   Thank you for choosing [facility] for your healthcare, and for allowing [place] to partner with you on your healthcare journey. I am thankful to be able to provide care to you today.   Elisha Headland FNP-C

## 2020-11-29 NOTE — Assessment & Plan Note (Signed)
Plan: Continue Symbicort 80 as outlined Follow-up in 1 year Would recommend seasonal flu vaccine and COVID-19 vaccinations, patient declines Continue as needed loratadine and as needed chlor tabs Can consider nasal saline rinses in the future if symptoms worsen

## 2020-11-29 NOTE — Progress Notes (Signed)
@Patient  ID: , female    DOB: 06/05/1980, 40 y.o.   MRN: [id number]  Chief Complaint  Patient presents with  . Follow-up    no complaints     Referring provider: [id number], NP  HPI:  40 year old female never smoker found office for cough variant asthma  PMH: Anxiety, insomnia, depression Smoker/ Smoking History: Never smoker Maintenance: Symbicort 80 Pt of: Dr. 41  11/29/2020  - Visit   40 year old female never smoker followed in our office for cough variant asthma.  Established with Dr. 41.  [name] presenting today as a 1 year follow-up.  She reports she is doing "the best she has done in the last 4 years".  She continues to be maintained on Symbicort 81 to 2 puffs in the evening.  She reports adherence to this.  She needs refills of her medications.  She has no acute respiratory complaints today.  She remains unvaccinated COVID-19 as well as seasonal flu vaccine.   Questionaires / Pulmonary Flowsheets:   ACT:  No flowsheet data found.  MMRC: No flowsheet data found.  Epworth:  No flowsheet data found.  Tests:   FENO:  No results found for: NITRICOXIDE  PFT: No flowsheet data found.  WALK:  No flowsheet data found.  Imaging: No results found.  Lab Results:  CBC    Component Value Date/Time   WBC 9.6 06/30/2019 0517   RBC 3.68 (L) 06/30/2019 0517   HGB 11.1 (L) 06/30/2019 0517   HGB 14.0 10/23/2017 0920   HCT 34.5 (L) 06/30/2019 0517   HCT 40.1 10/23/2017 0920   PLT 210 06/30/2019 0517   PLT 276 10/23/2017 0920   MCV 93.8 06/30/2019 0517   MCV 90 10/23/2017 0920   MCH 30.2 06/30/2019 0517   MCHC 32.2 06/30/2019 0517   RDW 13.2 06/30/2019 0517   RDW 13.2 10/23/2017 0920   LYMPHSABS 1.6 05/14/2019 1530   MONOABS 0.3 05/14/2019 1530   EOSABS 0.2 05/14/2019 1530   BASOSABS 0.1 05/14/2019 1530    BMET    Component Value Date/Time   NA 140 06/25/2019 1454   NA 141 10/23/2017 0920   K 4.4 06/25/2019 1454   CL 109  06/25/2019 1454   CO2 26 06/25/2019 1454   GLUCOSE 111 (H) 06/25/2019 1454   BUN 16 06/25/2019 1454   BUN 17 10/23/2017 0920   CREATININE 0.97 06/25/2019 1454   CALCIUM 8.9 06/25/2019 1454   GFRNONAA >60 06/25/2019 1454   GFRAA >60 06/25/2019 1454    BNP No results found for: BNP  ProBNP No results found for: PROBNP  Specialty Problems      Pulmonary Problems   Cough variant asthma    Onset in childhood  - Allergy profile 05/14/2019 >  Eos 0.2 /  IgE 36 RAST pos dog > cat, dust, grass   - 05/14/2019  A  changed flovent to dulera 100 2bid and d/c'd inderal - alpha one AT screen 05/14/19  Level 138 MM - 09/10/2019  After extensive coaching inhaler device,  effectiveness =    90%  - Sinus CT 10/01/2019: Positive for acute sinusitis with pansinus involvement.           Allergies  Allergen Reactions  . Erythromycin Nausea Only    Immunization History  Administered Date(s) Administered  . Influenza Split 09/09/2012  . Tdap 05/04/2016    Past Medical History:  Diagnosis Date  . Anxiety    panic attack  . Cough variant asthma  05/14/2019  . Depression   . Gastropathy 10/05/2019  . Migraine   . S/P laparoscopic hysterectomy 06/29/2019    Tobacco History: Social History   Tobacco Use  Smoking Status Never Smoker  Smokeless Tobacco Never Used   Counseling given: Not Answered   Continue to not smoke  Outpatient Encounter Medications as of 11/29/2020  Medication Sig  . ALPRAZolam (XANAX) 1 MG tablet Take 1 tablet (1 mg total) by mouth 2 (two) times daily as needed for anxiety.  . bisoprolol (ZEBETA) 5 MG tablet Take 1 tablet (5 mg total) by mouth daily. (Patient taking differently: Take 5 mg by mouth daily. )  . budesonide-formoterol (SYMBICORT) 80-4.5 MCG/ACT inhaler Inhale 2 puffs into the lungs 2 (two) times daily.  Marland Kitchen DEXILANT 60 MG capsule Take 1 capsule by mouth daily.  . mupirocin ointment (BACTROBAN) 2 % Apply 1 application topically daily as needed  (breakouts on face).   . sertraline (ZOLOFT) 100 MG tablet TAKE 2.5 TABLETS (250 MG TOTAL) BY MOUTH DAILY.  . traZODone (DESYREL) 100 MG tablet Take 1-2 tablets (100-200 mg total) by mouth at bedtime as needed for sleep.  Marland Kitchen albuterol (PROVENTIL HFA;VENTOLIN HFA) 108 (90 Base) MCG/ACT inhaler Inhale 1 puff into the lungs every 6 (six) hours as needed for wheezing or shortness of breath. (Patient not taking: Reported on 11/29/2020)  . cyclobenzaprine (FLEXERIL) 10 MG tablet Take 1 tablet (10 mg total) by mouth at bedtime. (Patient not taking: Reported on 11/29/2020)  . eszopiclone (LUNESTA) 2 MG TABS tablet Take 1 tablet (2 mg total) by mouth at bedtime as needed for sleep. Take immediately before bedtime (Patient not taking: Reported on 11/29/2020)  . gabapentin (NEURONTIN) 300 MG capsule Take 300 mg by mouth 3 (three) times daily. (Patient not taking: Reported on 11/29/2020)  . ISOtretinoin (ACCUTANE) 20 MG capsule Take 20 mg by mouth daily. (Patient not taking: Reported on 11/29/2020)  . nitrofurantoin, macrocrystal-monohydrate, (MACROBID) 100 MG capsule Take 1 capsule (100 mg total) by mouth 2 (two) times daily. (Patient not taking: Reported on 11/29/2020)  . predniSONE (DELTASONE) 10 MG tablet 40 mg x 3 days, 20 mg x 3 days, 10 mg x 3 days (Patient not taking: Reported on 11/29/2020)   No facility-administered encounter medications on file as of 11/29/2020.     Review of Systems  Review of Systems  Constitutional: Negative for activity change, fatigue and fever.  HENT: Negative for sinus pressure, sinus pain and sore throat.   Respiratory: Negative for cough, shortness of breath and wheezing.   Cardiovascular: Negative for chest pain and palpitations.  Gastrointestinal: Negative for diarrhea, nausea and vomiting.  Musculoskeletal: Negative for arthralgias.  Neurological: Negative for dizziness.  Psychiatric/Behavioral: Negative for sleep disturbance. The patient is not nervous/anxious.        Physical Exam  BP 118/76 (BP Location: Left Arm, Cuff Size: Normal)   Pulse (!) 106   Temp 98.8 F (37.1 C)   Ht 5\' 7"  (1.702 m)   Wt 184 lb (83.5 kg)   LMP 06/02/2019 (Approximate)   SpO2 98%   BMI 28.82 kg/m   Wt Readings from Last 5 Encounters:  11/29/20 184 lb (83.5 kg)  07/13/20 186 lb (84.4 kg)  04/12/20 188 lb (85.3 kg)  09/10/19 179 lb (81.2 kg)  06/29/19 172 lb 2 oz (78.1 kg)    BMI Readings from Last 5 Encounters:  11/29/20 28.82 kg/m  07/13/20 29.13 kg/m  04/12/20 29.44 kg/m  09/10/19 28.04 kg/m  06/29/19 26.96 kg/m  Physical Exam Vitals and nursing note reviewed.  Constitutional:      General: [name] not in acute distress.    Appearance: Normal appearance. [name] normal weight.  HENT:     Head: Normocephalic and atraumatic.     Right Ear: External ear normal.     Left Ear: External ear normal.     Nose: Nose normal. No congestion.     Mouth/Throat:     Mouth: Mucous membranes are moist.     Pharynx: Oropharynx is clear.  Eyes:     Pupils: Pupils are equal, round, and reactive to light.  Cardiovascular:     Rate and Rhythm: Regular rhythm. Tachycardia present.     Pulses: Normal pulses.     Heart sounds: Normal heart sounds. No murmur heard.   Pulmonary:     Breath sounds: No decreased air movement. No decreased breath sounds, wheezing or rales.  Musculoskeletal:     Cervical back: Normal range of motion.  Skin:    General: Skin is warm and dry.     Capillary Refill: Capillary refill takes less than 2 seconds.  Neurological:     General: No focal deficit present.     Mental Status: [name] alert and oriented to person, place, and time. Mental status is at baseline.     Gait: Gait normal.  Psychiatric:        Mood and Affect: Mood normal.        Behavior: Behavior normal.        Thought Content: Thought content normal.        Judgment: Judgment normal.       Assessment & Plan:   Cough variant asthma Plan: Continue  Symbicort 80 as outlined Follow-up in 1 year Would recommend seasonal flu vaccine and COVID-19 vaccinations, patient declines Continue as needed loratadine and as needed chlor tabs Can consider nasal saline rinses in the future if symptoms worsen    Return in about 1 year (around 11/29/2021), or if symptoms worsen or fail to improve, for Follow up with Dr. Sherene Sires.   Coral Ceo, NP 11/29/2020   This appointment required 23 minutes of patient care (this includes precharting, chart review, review of results, face-to-face care, etc.).

## 2020-12-28 ENCOUNTER — Other Ambulatory Visit: Payer: Self-pay | Admitting: Physician Assistant

## 2021-01-02 DIAGNOSIS — Z6828 Body mass index (BMI) 28.0-28.9, adult: Secondary | ICD-10-CM | POA: Insufficient documentation

## 2021-01-03 ENCOUNTER — Other Ambulatory Visit: Payer: Self-pay | Admitting: Pulmonary Disease

## 2021-01-30 DIAGNOSIS — M25511 Pain in right shoulder: Secondary | ICD-10-CM | POA: Insufficient documentation

## 2021-02-26 ENCOUNTER — Telehealth: Payer: BC Managed Care – PPO | Admitting: Nurse Practitioner

## 2021-02-26 DIAGNOSIS — J019 Acute sinusitis, unspecified: Secondary | ICD-10-CM

## 2021-02-26 MED ORDER — FLUTICASONE PROPIONATE 50 MCG/ACT NA SUSP: 2.0000 | Freq: Every day | NASAL | 0 refills | Status: DC

## 2021-02-26 MED ORDER — AMOXICILLIN-POT CLAVULANATE 875-125 MG PO TABS: 1.0000 | ORAL_TABLET | Freq: Two times a day (BID) | ORAL | 0 refills | Status: AC

## 2021-02-26 NOTE — Progress Notes (Signed)
We are sorry that you are not feeling well.  Here is how we plan to help!  Based on what you have shared with me it looks like you have sinusitis.  Sinusitis is inflammation and infection in the sinus cavities of the head.  Based on your presentation I believe you most likely have Acute Bacterial Sinusitis.  This is an infection caused by bacteria and is treated with antibiotics. I have prescribed Augmentin 875mg /125mg  one tablet twice daily with food, for 7 days. I have also prescribed Fluticasone nasal spray, administer 2 sprays in each nostril daily until symptoms improve. You may use an oral decongestant such as Mucinex D or if you have glaucoma or high blood pressure use plain Mucinex. Saline nasal spray help and can safely be used as often as needed for congestion.  If you develop worsening sinus pain, fever or notice severe headache and vision changes, or if symptoms are not better after completion of antibiotic, please schedule an appointment with a health care provider.    Sinus infections are not as easily transmitted as other respiratory infection, however we still recommend that you avoid close contact with loved ones, especially the very young and elderly.  Remember to wash your hands thoroughly throughout the day as this is the number one way to prevent the spread of infection!  Home Care:  Only take medications as instructed by your medical team.  Complete the entire course of an antibiotic.  Do not take these medications with alcohol.  A steam or ultrasonic humidifier can help congestion.  You can place a towel over your head and breathe in the steam from hot water coming from a faucet.  Avoid close contacts especially the very young and the elderly.  Cover your mouth when you cough or sneeze.  Always remember to wash your hands.  Get Help Right Away If:  You develop worsening fever or sinus pain.  You develop a severe head ache or visual changes.  Your symptoms persist  after you have completed your treatment plan.  Make sure you  Understand these instructions.  Will watch your condition.  Will get help right away if you are not doing well or get worse.  Your e-visit answers were reviewed by a board certified advanced clinical practitioner to complete your personal care plan.  Depending on the condition, your plan could have included both over the counter or prescription medications.  If there is a problem please reply  once you have received a response from your provider.  Your safety is important to .  If you have drug allergies check your prescription carefully.    You can use MyChart to ask questions about today's visit, request a non-urgent call back, or ask for a work or school excuse for 24 hours related to this e-Visit. If it has been greater than 24 hours you will need to follow up with your provider, or enter a new e-Visit to address those concerns.  You will get an e-mail in the next two days asking about your experience.  I hope that your e-visit has been valuable and will speed your recovery. Thank you for using e-visits.  I have spent at least 5 minutes reviewing and documenting in the patient's chart.

## 2021-03-16 ENCOUNTER — Other Ambulatory Visit: Payer: Self-pay

## 2021-03-17 ENCOUNTER — Ambulatory Visit: Payer: BC Managed Care – PPO | Admitting: Adult Health

## 2021-03-21 ENCOUNTER — Encounter: Payer: Self-pay | Admitting: Adult Health

## 2021-03-21 ENCOUNTER — Other Ambulatory Visit: Payer: Self-pay

## 2021-03-21 ENCOUNTER — Ambulatory Visit: Payer: BC Managed Care – PPO | Admitting: Adult Health

## 2021-03-21 VITALS — BP 124/64 | HR 74 | Temp 98.6°F | Wt 187.6 lb

## 2021-03-21 DIAGNOSIS — R002 Palpitations: Secondary | ICD-10-CM | POA: Diagnosis not present
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
## 2021-03-22 LAB — CBC WITH DIFFERENTIAL/PLATELET
Basophils Absolute: 0 10*3/uL (ref 0.0–0.1)
Basophils Relative: 0.6 % (ref 0.0–3.0)
Eosinophils Absolute: 0.2 10*3/uL (ref 0.0–0.7)
Eosinophils Relative: 2.4 % (ref 0.0–5.0)
HCT: 42 % (ref 36.0–46.0)
Hemoglobin: 14.3 g/dL (ref 12.0–15.0)
Lymphocytes Relative: 22 % (ref 12.0–46.0)
Lymphs Abs: 1.8 10*3/uL (ref 0.7–4.0)
MCHC: 33.9 g/dL (ref 30.0–36.0)
MCV: 93.7 fl (ref 78.0–100.0)
Monocytes Absolute: 0.5 10*3/uL (ref 0.1–1.0)
Monocytes Relative: 6.2 % (ref 3.0–12.0)
Neutro Abs: 5.5 10*3/uL (ref 1.4–7.7)
Neutrophils Relative %: 68.8 % (ref 43.0–77.0)
Platelets: 281 10*3/uL (ref 150.0–400.0)
RBC: 4.49 Mil/uL (ref 3.87–5.11)
RDW: 13.6 % (ref 11.5–15.5)
WBC: 8 10*3/uL (ref 4.0–10.5)

## 2021-03-22 LAB — IBC + FERRITIN
Ferritin: 27.6 ng/mL (ref 10.0–291.0)
Iron: 152 ug/dL — ABNORMAL HIGH (ref 42–145)
Saturation Ratios: 40.8 % (ref 20.0–50.0)
Transferrin: 266 mg/dL (ref 212.0–360.0)

## 2021-03-22 LAB — TSH: TSH: 2.81 u[IU]/mL (ref 0.35–4.50)

## 2021-03-24 ENCOUNTER — Ambulatory Visit: Payer: BC Managed Care – PPO | Admitting: Adult Health

## 2021-04-04 ENCOUNTER — Other Ambulatory Visit: Payer: Self-pay | Admitting: Physician Assistant

## 2021-04-04 NOTE — Telephone Encounter (Signed)
Controlled substance 

## 2021-04-04 NOTE — Telephone Encounter (Signed)
Pt called asking for refill on Alprazolam. Pt said last 2 refills on Rx expired per [facility]. Last apt 10/04/20

## 2021-04-05 ENCOUNTER — Telehealth: Payer: Self-pay | Admitting: Physician Assistant

## 2021-04-05 ENCOUNTER — Other Ambulatory Visit: Payer: Self-pay | Admitting: Physician Assistant

## 2021-04-05 MED ORDER — ALPRAZOLAM 1 MG PO TABS: 1.0000 mg | ORAL_TABLET | Freq: Two times a day (BID) | ORAL | 1 refills | Status: DC | PRN

## 2021-04-05 NOTE — Telephone Encounter (Signed)
Pt called and made appt for 06/08/21. Pt down to last pill of Alprazolam. Please send to [facility] on [address] in [place].

## 2021-04-05 NOTE — Telephone Encounter (Signed)
Please review

## 2021-04-05 NOTE — Telephone Encounter (Signed)
Reviewed

## 2021-04-05 NOTE — Telephone Encounter (Signed)
Note to self, if she doesn't keep the appt in June, I'll wean off benzos and no longer Rx them, d/t habitually rescheduled appointments, then not following up at all.

## 2021-04-06 DIAGNOSIS — F32A Depression, unspecified: Secondary | ICD-10-CM | POA: Insufficient documentation

## 2021-04-07 ENCOUNTER — Other Ambulatory Visit: Payer: Self-pay

## 2021-04-07 ENCOUNTER — Encounter: Payer: Self-pay | Admitting: Cardiology

## 2021-04-07 ENCOUNTER — Ambulatory Visit: Payer: BC Managed Care – PPO | Admitting: Cardiology

## 2021-04-07 VITALS — BP 116/82 | HR 75 | Ht 67.0 in | Wt 187.2 lb

## 2021-04-07 DIAGNOSIS — R002 Palpitations: Secondary | ICD-10-CM

## 2021-04-07 DIAGNOSIS — R0602 Shortness of breath: Secondary | ICD-10-CM | POA: Diagnosis not present

## 2021-04-07 DIAGNOSIS — F411 Generalized anxiety disorder: Secondary | ICD-10-CM

## 2021-04-07 NOTE — Patient Instructions (Addendum)
Medication Instructions:  Your physician recommends that you continue on your current medications as directed. Please refer to the Current Medication list given to you today.  *If you need a refill on your cardiac medications before your next appointment, please call your pharmacy*   Lab Work: None *If you have labs (blood work) drawn today and your tests are completely normal, you will receive your results only by: Marland Kitchen MyChart Message (if you have MyChart) OR . A paper copy in the mail If you have any lab test that is abnormal or we need to change your treatment, we will call you to review the results.   Testing/Procedures: Your physician has requested that you have an echocardiogram. Echocardiography is a painless test that uses sound waves to create images of your heart. It provides your doctor with information about the size and shape of your heart and how well your heart's chambers and valves are working. This procedure takes approximately one hour. There are no restrictions for this procedure.   Follow-Up: At [facility], you and your health needs are our priority.  As part of our continuing mission to provide you with exceptional heart care, we have created designated Provider Care Teams.  These Care Teams include your primary Cardiologist (physician) and Advanced Practice Providers (APPs -  Physician Assistants and Nurse Practitioners) who all work together to provide you with the care you need, when you need it.  We recommend signing up for the patient portal called "MyChart".  Sign up information is provided on this After Visit Summary.  MyChart is used to connect with patients for Virtual Visits (Telemedicine).  Patients are able to view lab/test results, encounter notes, upcoming appointments, etc.  Non-urgent messages can be sent to your provider as well.   To learn more about what you can do with MyChart, go to [email or web address].    Your next appointment:   3  month(s)  The format for your next appointment:   In Person  Provider:   Thomasene Ripple, DO   Other Instructions  Please get a blood pressure cuff, if heart rate is 130 beats per minute more than 30 minutes please take half of your Bisoprolol. Provided your sbp (top number) is greater than 110 mmHg.   KardiaMobile [email or web address]        FDA-cleared, clinical grade mobile EKG monitor: Lourena Simmonds is the most clinically-validated mobile EKG used by the world's leading cardiac care medical professionals With Basic service, know instantly if your heart rhythm is normal or if atrial fibrillation is detected, and email the last single EKG recording to yourself or your doctor Premium service, available for purchase through the K[employer]ia app for $9.99 per month or $99 per year, includes unlimited history and storage of your EKG recordings, a monthly EKG summary report to share with your doctor, along with the ability to track your blood pressure, activity and weight Includes one K[employer]iaMobile phone clip FREE SHIPPING: Standard delivery 1-3 business days. Orders placed by 11:00am PST will ship that afternoon. Otherwise, will ship next business day. All orders ship via [employer] from [place], [place]    [email or web address] [email or web address]   Echocardiogram An echocardiogram is a test that uses sound waves (ultrasound) to produce images of the heart. Images from an echocardiogram can provide important information about:  Heart size and shape.  The size and thickness and movement of your heart's walls.  Heart muscle function and strength.  Heart valve function or if you have stenosis. Stenosis is when  the heart valves are too narrow.  If blood is flowing backward through the heart valves (regurgitation).  A tumor or infectious growth around the heart valves.  Areas of heart muscle that are not working well because of poor blood flow or  injury from a heart attack.  Aneurysm detection. An aneurysm is a weak or damaged part of an artery wall. The wall bulges out from the normal force of blood pumping through the body. Tell a health care provider about:  Any allergies you have.  All medicines you are taking, including vitamins, herbs, eye drops, creams, and over-the-counter medicines.  Any blood disorders you have.  Any surgeries you have had.  Any medical conditions you have.  Whether you are pregnant or may be pregnant. What are the risks? Generally, this is a safe test. However, problems may occur, including an allergic reaction to dye (contrast) that may be used during the test. What happens before the test? No specific preparation is needed. You may eat and drink normally. What happens during the test?  You will take off your clothes from the waist up and put on a hospital gown.  Electrodes or electrocardiogram (ECG)patches may be placed on your chest. The electrodes or patches are then connected to a device that monitors your heart rate and rhythm.  You will lie down on a table for an ultrasound exam. A gel will be applied to your chest to help sound waves pass through your skin.  A handheld device, called a transducer, will be pressed against your chest and moved over your heart. The transducer produces sound waves that travel to your heart and bounce back (or "echo" back) to the transducer. These sound waves will be captured in real-time and changed into images of your heart that can be viewed on a video monitor. The images will be recorded on a computer and reviewed by your health care provider.  You may be asked to change positions or hold your breath for a short time. This makes it easier to get different views or better views of your heart.  In some cases, you may receive contrast through an IV in one of your veins. This can improve the quality of the pictures from your heart. The procedure may vary among  health care providers and hospitals.   What can I expect after the test? You may return to your normal, everyday life, including diet, activities, and medicines, unless your health care provider tells you not to do that. Follow these instructions at home:  It is up to you to get the results of your test. Ask your health care provider, or the department that is doing the test, when your results will be ready.  Keep all follow-up visits. This is important. Summary  An echocardiogram is a test that uses sound waves (ultrasound) to produce images of the heart.  Images from an echocardiogram can provide important information about the size and shape of your heart, heart muscle function, heart valve function, and other possible heart problems.  You do not need to do anything to prepare before this test. You may eat and drink normally.  After the echocardiogram is completed, you may return to your normal, everyday life, unless your health care provider tells you not to do that. This information is not intended to replace advice given to you by your health care provider. Make sure you discuss any questions you have with your health care provider. Document Revised: 08/09/2020 Document Reviewed: 08/09/2020 Elsevier  Patient Education  2021 [employer].
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
## 2021-04-23 ENCOUNTER — Telehealth: Payer: BC Managed Care – PPO | Admitting: Family

## 2021-04-23 DIAGNOSIS — R399 Unspecified symptoms and signs involving the genitourinary system: Secondary | ICD-10-CM

## 2021-04-23 MED ORDER — CEPHALEXIN 500 MG PO CAPS: 500.0000 mg | ORAL_CAPSULE | Freq: Two times a day (BID) | ORAL | 0 refills | Status: DC

## 2021-04-23 NOTE — Progress Notes (Signed)

## 2021-05-05 ENCOUNTER — Other Ambulatory Visit: Payer: Self-pay

## 2021-05-05 ENCOUNTER — Ambulatory Visit (INDEPENDENT_AMBULATORY_CARE_PROVIDER_SITE_OTHER): Payer: BC Managed Care – PPO

## 2021-05-05 DIAGNOSIS — R0602 Shortness of breath: Secondary | ICD-10-CM | POA: Diagnosis not present

## 2021-05-05 LAB — ECHOCARDIOGRAM COMPLETE
Area-P 1/2: 3.61 cm2
S' Lateral: 2.6 cm

## 2021-05-05 NOTE — Progress Notes (Signed)
Complete echocardiogram performed.  Jimmy Tylee Newby RDCS, RVT  

## 2021-06-08 ENCOUNTER — Encounter: Payer: Self-pay | Admitting: Physician Assistant

## 2021-06-08 ENCOUNTER — Telehealth: Payer: BC Managed Care – PPO | Admitting: Physician Assistant

## 2021-06-08 ENCOUNTER — Telehealth (INDEPENDENT_AMBULATORY_CARE_PROVIDER_SITE_OTHER): Payer: BC Managed Care – PPO | Admitting: Physician Assistant

## 2021-06-08 DIAGNOSIS — R3989 Other symptoms and signs involving the genitourinary system: Secondary | ICD-10-CM | POA: Diagnosis not present

## 2021-06-08 DIAGNOSIS — G4700 Insomnia, unspecified: Secondary | ICD-10-CM | POA: Diagnosis not present

## 2021-06-08 DIAGNOSIS — F411 Generalized anxiety disorder: Secondary | ICD-10-CM | POA: Diagnosis not present

## 2021-06-08 DIAGNOSIS — F3342 Major depressive disorder, recurrent, in full remission: Secondary | ICD-10-CM | POA: Diagnosis not present

## 2021-06-08 MED ORDER — CEPHALEXIN 500 MG PO CAPS: 500.0000 mg | ORAL_CAPSULE | Freq: Two times a day (BID) | ORAL | 0 refills | Status: DC

## 2021-06-08 MED ORDER — SERTRALINE HCL 100 MG PO TABS: 250.0000 mg | ORAL_TABLET | Freq: Every day | ORAL | 1 refills | Status: DC

## 2021-06-08 MED ORDER — ALPRAZOLAM 1 MG PO TABS: 1.0000 mg | ORAL_TABLET | Freq: Two times a day (BID) | ORAL | 5 refills | Status: DC | PRN

## 2021-06-08 NOTE — Progress Notes (Signed)

## 2021-06-08 NOTE — Progress Notes (Signed)
[facility] Med Check  Patient ID: Joanne Molina,  MRN: [id number]  PCP: Shirline Frees, NP  Date of Evaluation: 06/08/2021 Time spent:40 minutes  Chief Complaint:  Chief Complaint   Anxiety; Follow-up; Depression; Insomnia     Virtual Visit via Telehealth  I connected with patient by a video enabled telemedicine application with their informed consent, and verified patient privacy and that I am speaking with the correct person using two identifiers.  I am private, in my office and the patient is at home.  I discussed the limitations, risks, security and privacy concerns of performing an evaluation and management service by video and the availability of in person appointments. I also discussed with the patient that there may be a patient responsible charge related to this service. The patient expressed understanding and agreed to proceed.   I discussed the assessment and treatment plan with the patient. The patient was provided an opportunity to ask questions and all were answered. The patient agreed with the plan and demonstrated an understanding of the instructions.   The patient was advised to call back or seek an in-person evaluation if the symptoms worsen or if the condition fails to improve as anticipated.  I provided 40 minutes of non-face-to-face time during this encounter.  HISTORY/CURRENT STATUS: For routine med check.  No major PA in past 6 months or so. Has had a few 'minor ones' associated with palpitations.  That is how she got to the cardiologist and had a work-up.  She has a very strong history of heart problems, her mom, grand mother I think on her mom's side so it is reassuring that things were normal.  States it makes her feel a lot better knowing that if she does have palpitations she is not going to die.  She does take a Xanax mostly twice a day for the anxiety but sometimes only once.  It is still very effective.  [name] also on the bisoprolol for anxiety.  She  is allowed to take an extra 1/2 pill for panic is needed, as long as her blood pressure is okay.  Patient denies loss of interest in usual activities and is able to enjoy things.  Denies decreased energy or motivation.  Appetite has not changed.  No extreme sadness, tearfulness, or feelings of hopelessness.  Denies any changes in concentration, making decisions or remembering things.  She sleeps well most of the time but occasionally she will have a night where she cannot go to sleep until 2 or 3 in the morning.  We have tried numerous sleep medications and it seems that nothing helps.  Even Xanax a few hours before bed or at bedtime does not seem to help on those nights.  [name] learning to live with it.  Denies suicidal or homicidal thoughts.  Patient denies increased energy with decreased need for sleep, no increased talkativeness, no racing thoughts, no impulsivity or risky behaviors, no increased spending, no increased libido, no paranoia, no grandiosity, no hallucinations.  Denies dizziness, syncope, seizures, numbness, tingling, tremor, tics, unsteady gait, slurred speech, confusion. Denies muscle or joint pain, stiffness, or dystonia.  Individual Medical History/ Review of Systems: Changes? :Yes   neck surgery in Dec.  Had cardiac w/u in April/May for palpitations. Normal imaging results.    Past medications for mental health diagnoses include: Sonata, Paxil, Celexa, Effexor, BuSpar made the anxiety worse, Ativan, Klonopin, Prozac, Xanax, Ambien, Lunesta, trazodone  Allergies: Erythromycin  Current Medications:  Current Outpatient Medications:  albuterol (VENTOLIN HFA) 108 (90 Base) MCG/ACT inhaler, Inhale 1 puff into the lungs every 6 (six) hours as needed for wheezing or shortness of breath., Disp: 18 g, Rfl: 11   bisoprolol (ZEBETA) 5 MG tablet, TAKE 1 TABLET BY MOUTH EVERY DAY, Disp: 30 tablet, Rfl: 3   budesonide-formoterol (SYMBICORT) 80-4.5 MCG/ACT inhaler, Inhale 2 puffs into  the lungs 2 (two) times daily., Disp: 10.2 g, Rfl: 11   DEXILANT 60 MG capsule, Take 1 capsule (60 mg total) by mouth daily., Disp: 30 capsule, Rfl: 11   ALPRAZolam (XANAX) 1 MG tablet, Take 1 tablet (1 mg total) by mouth 2 (two) times daily as needed for anxiety., Disp: 60 tablet, Rfl: 5   cephALEXin (KEFLEX) 500 MG capsule, Take 1 capsule (500 mg total) by mouth 2 (two) times daily., Disp: 14 capsule, Rfl: 0   sertraline (ZOLOFT) 100 MG tablet, Take 2.5 tablets (250 mg total) by mouth daily., Disp: 225 tablet, Rfl: 1 Medication Side Effects: none  Family Medical/ Social History: Changes? No  MENTAL HEALTH EXAM:  Last menstrual period 06/02/2019, unknown if currently breastfeeding.There is no height or weight on file to calculate BMI.  General Appearance: Casual, Neat and Well Groomed  Eye Contact:  Good  Speech:  Clear and Coherent and Normal Rate  Volume:  Normal  Mood:  Euthymic  Affect:  Appropriate   Thought Process:  Goal Directed and Descriptions of Associations: Intact  Orientation:  Full (Time, Place, and Person)  Thought Content: Logical   Suicidal Thoughts:  No  Homicidal Thoughts:  No  Memory:  WNL  Judgement:  Good  Insight:  Good  Psychomotor Activity:  Normal  Concentration:  Concentration: Good  Recall:  Good  Fund of Knowledge: Good  Language: Good  Assets:  Desire for Improvement  ADL's:  Intact  Cognition: WNL  Prognosis:  Good   Labs from 03/21/2021 were reviewed.  DIAGNOSES:    ICD-10-CM   1. Major depression, recurrent, full remission (HCC)  F33.42     2. Generalized anxiety disorder  F41.1     3. Insomnia, unspecified type  G47.00        Receiving Psychotherapy: No    RECOMMENDATIONS:  PDMP was reviewed.  Xanax last filled 05/07/2021. I provided 40 minutes of non face-to-face time during this encounter, including time spent before and after the visit in records review, medical decision making, and charting. [name] doing very well from my  standpoint so no medication changes need to be made. Continue Zoloft 100 mg, 2.5 pills daily. Continue Xanax 1 mg 1 p.o. twice daily as needed anxiety. Return in 6 months.  Melony Overly, PA-C

## 2021-06-30 ENCOUNTER — Ambulatory Visit: Payer: BC Managed Care – PPO | Admitting: Cardiology

## 2021-08-04 ENCOUNTER — Other Ambulatory Visit: Payer: Self-pay | Admitting: Pulmonary Disease

## 2021-08-28 ENCOUNTER — Telehealth: Payer: BC Managed Care – PPO | Admitting: Physician Assistant

## 2021-08-28 DIAGNOSIS — R3989 Other symptoms and signs involving the genitourinary system: Secondary | ICD-10-CM

## 2021-08-28 DIAGNOSIS — R300 Dysuria: Secondary | ICD-10-CM

## 2021-08-28 MED ORDER — CEPHALEXIN 500 MG PO CAPS: 500.0000 mg | ORAL_CAPSULE | Freq: Two times a day (BID) | ORAL | 0 refills | Status: DC

## 2021-08-28 NOTE — Progress Notes (Signed)
I have spent 5 minutes in review of e-visit questionnaire, review and updating patient chart, medical decision making and response to patient.   Abdelrahman Nair Cody Lister Brizzi, PA-C    

## 2021-08-28 NOTE — Progress Notes (Signed)

## 2021-09-13 ENCOUNTER — Ambulatory Visit (HOSPITAL_COMMUNITY)
Admission: EM | Admit: 2021-09-13 | Discharge: 2021-09-13 | Disposition: A | Discharge status: Home / Self Care | Payer: BC Managed Care – PPO | Attending: Emergency Medicine | Admitting: Emergency Medicine

## 2021-09-13 ENCOUNTER — Other Ambulatory Visit: Payer: Self-pay

## 2021-09-13 ENCOUNTER — Encounter (HOSPITAL_COMMUNITY): Payer: Self-pay | Admitting: *Deleted

## 2021-09-13 ENCOUNTER — Ambulatory Visit (INDEPENDENT_AMBULATORY_CARE_PROVIDER_SITE_OTHER): Payer: BC Managed Care – PPO

## 2021-09-13 DIAGNOSIS — R059 Cough, unspecified: Secondary | ICD-10-CM | POA: Diagnosis not present

## 2021-09-13 DIAGNOSIS — J4551 Severe persistent asthma with (acute) exacerbation: Secondary | ICD-10-CM | POA: Insufficient documentation

## 2021-09-13 DIAGNOSIS — Z20822 Contact with and (suspected) exposure to covid-19: Secondary | ICD-10-CM | POA: Insufficient documentation

## 2021-09-13 MED ORDER — DEXAMETHASONE SODIUM PHOSPHATE 10 MG/ML IJ SOLN
INTRAMUSCULAR | Status: AC
  Filled 2021-09-13: qty 1

## 2021-09-13 MED ORDER — BUDESONIDE-FORMOTEROL FUMARATE 160-4.5 MCG/ACT IN AERO: 2.0000 | INHALATION_SPRAY | Freq: Two times a day (BID) | RESPIRATORY_TRACT | 0 refills | Status: DC

## 2021-09-13 MED ORDER — DEXAMETHASONE SODIUM PHOSPHATE 10 MG/ML IJ SOLN
10.0000 mg | Freq: Once | INTRAMUSCULAR | Status: AC
  Administered 2021-09-13: 10 mg via INTRAVENOUS

## 2021-09-13 MED ORDER — DEXAMETHASONE 2 MG PO TABS: ORAL_TABLET | ORAL | 0 refills | Status: AC
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
## 2021-09-13 NOTE — Discharge Instructions (Addendum)
Your chest x-ray today was normal, no concern for pneumonia or superinfection.  I have increased her dose of Symbicort to 160 mg, please pick this up and begin today, taking extra doses of the 80 mg is not as effective as increasing the dose to 160.  I have written you prescription for a tapering dose of a different steroid, dexamethasone, which may provide you with better benefit.  You also received an injection of the steroid in the office today.  Please follow-up with your primary care physician in the next 1 to 2 days to evaluate your progress.  COVID testing was performed today given your poor response to prednisone as well as to rule this out.  Note that your previous test was negative however sometimes positive results can be delayed.

## 2021-09-13 NOTE — ED Triage Notes (Signed)
Pt reports upper respiratory infection started 2 weeks ago. Pt went to an [facility] and was treated . Pt reports no improvement.

## 2021-09-14 LAB — SARS CORONAVIRUS 2 (TAT 6-24 HRS): SARS Coronavirus 2: NEGATIVE

## 2021-09-19 ENCOUNTER — Encounter: Payer: Self-pay | Admitting: Adult Health

## 2021-09-19 ENCOUNTER — Ambulatory Visit: Payer: BC Managed Care – PPO | Admitting: Adult Health

## 2021-09-19 ENCOUNTER — Other Ambulatory Visit: Payer: Self-pay

## 2021-09-19 DIAGNOSIS — J310 Chronic rhinitis: Secondary | ICD-10-CM | POA: Insufficient documentation

## 2021-09-19 DIAGNOSIS — J45991 Cough variant asthma: Secondary | ICD-10-CM | POA: Diagnosis not present

## 2021-09-19 MED ORDER — MONTELUKAST SODIUM 10 MG PO TABS: 10.0000 mg | ORAL_TABLET | Freq: Every day | ORAL | 11 refills | Status: DC

## 2021-09-19 NOTE — Assessment & Plan Note (Signed)
Acute asthmatic exacerbation.  Patient is to finish up her prednisone.  Next week we will check lab work with IgE and CBC with differential. Add Singulair 10 mg daily.  Change to Zyrtec 10 mg daily.  Check PFTs on return.  Plan  Patient Instructions  Finish Prednisone taper as directed.  Continue on Symbicort 160 2 puffs Twice daily  , rinse after use.  Add Singulair 10mg  At bedtime   Change Claritin to Zyrtec 10mg  At bedtime   Albuterol inhaler As needed   Labs at [facility]  Follow up with Dr.  in 6 weeks with PFT and As needed   Please contact office for sooner follow up if symptoms do not improve or worsen or seek emergency care

## 2021-09-19 NOTE — Patient Instructions (Addendum)
Finish Prednisone taper as directed.  Continue on Symbicort 160 2 puffs Twice daily  , rinse after use.  Add Singulair 10mg  At bedtime   Change Claritin to Zyrtec 10mg  At bedtime   Albuterol inhaler As needed   Labs at [facility]  Follow up with Dr.  in 6 weeks with PFT and As needed   Please contact office for sooner follow up if symptoms do not improve or worsen or seek emergency care

## 2021-09-19 NOTE — Assessment & Plan Note (Signed)
Add Singulair daily.  Change Claritin to Zyrtec.  Check IgE and CBC with the  Plan Patient Instructions  Finish Prednisone taper as directed.  Continue on Symbicort 160 2 puffs Twice daily  , rinse after use.  Add Singulair 10mg  At bedtime   Change Claritin to Zyrtec 10mg  At bedtime   Albuterol inhaler As needed   Labs at [facility]  Follow up with Dr.  in 6 weeks with PFT and As needed   Please contact office for sooner follow up if symptoms do not improve or worsen or seek emergency care

## 2021-09-19 NOTE — Progress Notes (Signed)
@Patient  ID: , female    DOB: Dec 06, 1980, 41 y.o.   MRN: [id number]  Chief Complaint  Patient presents with   Hospitalization Follow-up    Referring provider: [id number], NP  HPI: 41 year old female never smoker followed for asthma  TEST/EVENTS :   09/19/2021 Follow up: Asthma  Patient returns for a follow-up visit.  Patient has underlying asthma.  She says she typically has a flare of her asthma every fall.  Over the last 2 years has been doing well up until this past 2 weeks.  Patient started having increased sinus congestion drainage.  She took a COVID test that was negative.  Symptoms continue to worsen despite using Mucinex Claritin and saline nasal rinses.  She was seen at a local urgent care and given a Z-Pak and a prednisone taper.  Patient said her symptoms continued without significant improvement.  She went to the emergency room on September 13, 2021.  COVID-19 test was negative.  Chest x-ray showed no acute process.  She was given a Depo-Medrol injection and started on a Medrol Dosepak.  Patient says yesterday she started having some improvement in symptoms.  She also changed from Symbicort 80 daily to Symbicort 160 twice daily.  She denies any hemoptysis chest pain orthopnea PND.  [name] a September 15, 2021.  She does not have any pets.    Allergies  Allergen Reactions   Erythromycin Nausea Only    Immunization History  Administered Date(s) Administered   Influenza Split 09/09/2012   Tdap 05/04/2016    Past Medical History:  Diagnosis Date   Anxiety    panic attack   Cough variant asthma 05/14/2019   Depression    Gastropathy 10/05/2019   Migraine    S/P laparoscopic hysterectomy 06/29/2019    Tobacco History: Social History   Tobacco Use  Smoking Status Never  Smokeless Tobacco Never   Counseling given: Not Answered   Outpatient Medications Prior to Visit  Medication Sig Dispense Refill   albuterol (VENTOLIN HFA) 108 (90 Base)  MCG/ACT inhaler Inhale 1 puff into the lungs every 6 (six) hours as needed for wheezing or shortness of breath. 18 g 11   ALPRAZolam (XANAX) 1 MG tablet Take 1 tablet (1 mg total) by mouth 2 (two) times daily as needed for anxiety. 60 tablet 5   bisoprolol (ZEBETA) 5 MG tablet TAKE 1 TABLET BY MOUTH EVERY DAY 30 tablet 3   budesonide-formoterol (SYMBICORT) 160-4.5 MCG/ACT inhaler Inhale 2 puffs into the lungs 2 (two) times daily. 1 each 0   dexamethasone (DECADRON) 2 MG tablet Take 4 tablets (8 mg total) by mouth 2 (two) times daily with a meal for 2 days, THEN 2 tablets (4 mg total) 2 (two) times daily with a meal for 2 days, THEN 1 tablet (2 mg total) 2 (two) times daily with a meal for 2 days, THEN 0.5 tablets (1 mg total) 2 (two) times daily with a meal for 2 days. 30 tablet 0   pantoprazole (PROTONIX) 40 MG tablet Take 1 tablet (40 mg total) by mouth daily. 30 tablet 3   sertraline (ZOLOFT) 100 MG tablet Take 2.5 tablets (250 mg total) by mouth daily. 225 tablet 1   budesonide-formoterol (SYMBICORT) 80-4.5 MCG/ACT inhaler Inhale 2 puffs into the lungs 2 (two) times daily. (Patient not taking: Reported on 09/19/2021) 10.2 g 11   cephALEXin (KEFLEX) 500 MG capsule Take 1 capsule (500 mg total) by mouth 2 (two) times daily. (Patient not taking: Reported  on 09/19/2021) 14 capsule 0   No facility-administered medications prior to visit.     Review of Systems:   Constitutional:   No  weight loss, night sweats,  Fevers, chills,  +fatigue, or  lassitude.  HEENT:   No headaches,  Difficulty swallowing,  Tooth/dental problems, or  Sore throat,                No sneezing, itching, ear ache,  nasal congestion, post nasal drip,   CV:  No chest pain,  Orthopnea, PND, swelling in lower extremities, anasarca, dizziness, palpitations, syncope.   GI  No heartburn, indigestion, abdominal pain, nausea, vomiting, diarrhea, change in bowel habits, loss of appetite, bloody stools.   Resp:   No chest wall  deformity  Skin: no rash or lesions.  GU: no dysuria, change in color of urine, no urgency or frequency.  No flank pain, no hematuria   MS:  No joint pain or swelling.  No decreased range of motion.  No back pain.    Physical Exam  BP 124/90 (BP Location: Left Arm, Patient Position: Sitting, Cuff Size: Normal)   Pulse 74   Temp 98.6 F (37 C) (Oral)   Ht 5\' 7"  (1.702 m)   Wt 174 lb (78.9 kg)   LMP 06/02/2019 (Approximate)   SpO2 95%   BMI 27.25 kg/m   GEN: A/Ox3; pleasant , NAD, well nourished    HEENT:  /AT,   NOSE-clear, THROAT-clear, no lesions, no postnasal drip or exudate noted.   NECK:  Supple w/ fair ROM; no JVD; normal carotid impulses w/o bruits; no thyromegaly or nodules palpated; no lymphadenopathy.    RESP  Clear  P & A; w/o, wheezes/ rales/ or rhonchi. no accessory muscle use, no dullness to percussion  CARD:  RRR, no m/r/g, no peripheral edema, pulses intact, no cyanosis or clubbing.  GI:   Soft & nt; nml bowel sounds; no organomegaly or masses detected.   Musco: Warm bil, no deformities or joint swelling noted.   Neuro: alert, no focal deficits noted.    Skin: Warm, no lesions or rashes    Lab Results:  CBC    Component Value Date/Time   WBC 8.0 03/21/2021 1620   RBC 4.49 03/21/2021 1620   HGB 14.3 03/21/2021 1620   HGB 14.0 10/23/2017 0920   HCT 42.0 03/21/2021 1620   HCT 40.1 10/23/2017 0920   PLT 281.0 03/21/2021 1620   PLT 276 10/23/2017 0920   MCV 93.7 03/21/2021 1620   MCV 90 10/23/2017 0920   MCH 30.2 06/30/2019 0517   MCHC 33.9 03/21/2021 1620   RDW 13.6 03/21/2021 1620   RDW 13.2 10/23/2017 0920   LYMPHSABS 1.8 03/21/2021 1620   MONOABS 0.5 03/21/2021 1620   EOSABS 0.2 03/21/2021 1620   BASOSABS 0.0 03/21/2021 1620    BMET    Component Value Date/Time   NA 140 06/25/2019 1454   NA 141 10/23/2017 0920   K 4.4 06/25/2019 1454   CL 109 06/25/2019 1454   CO2 26 06/25/2019 1454   GLUCOSE 111 (H) 06/25/2019 1454   BUN 16  06/25/2019 1454   BUN 17 10/23/2017 0920   CREATININE 0.97 06/25/2019 1454   CALCIUM 8.9 06/25/2019 1454   GFRNONAA >60 06/25/2019 1454   GFRAA >60 06/25/2019 1454    BNP No results found for: BNP  ProBNP No results found for: PROBNP  Imaging: DG Chest 2 View  Result Date: 09/13/2021 CLINICAL DATA:  Cough for 2 weeks  EXAM: CHEST - 2 VIEW COMPARISON:  12/28/2018 FINDINGS: The heart size and mediastinal contours are within normal limits. Both lungs are clear. The visualized skeletal structures are unremarkable. IMPRESSION: No active cardiopulmonary disease. Electronically Signed   By: Deatra Robinson M.D.   On: 09/13/2021 20:04      No flowsheet data found.  No results found for: NITRICOXIDE      Assessment & Plan:   Cough variant asthma Acute asthmatic exacerbation.  Patient is to finish up her prednisone.  Next week we will check lab work with IgE and CBC with differential. Add Singulair 10 mg daily.  Change to Zyrtec 10 mg daily.  Check PFTs on return.  Plan  Patient Instructions  Finish Prednisone taper as directed.  Continue on Symbicort 160 2 puffs Twice daily  , rinse after use.  Add Singulair 10mg  At bedtime   Change Claritin to Zyrtec 10mg  At bedtime   Albuterol inhaler As needed   Labs at [facility]  Follow up with Dr.  in 6 weeks with PFT and As needed   Please contact office for sooner follow up if symptoms do not improve or worsen or seek emergency care         Chronic rhinitis Add Singulair daily.  Change Claritin to Zyrtec.  Check IgE and CBC with the  Plan Patient Instructions  Finish Prednisone taper as directed.  Continue on Symbicort 160 2 puffs Twice daily  , rinse after use.  Add Singulair 10mg  At bedtime   Change Claritin to Zyrtec 10mg  At bedtime   Albuterol inhaler As needed   Labs at [facility]  Follow up with Dr. Sherene Sires  in 6 weeks with PFT and As needed   Please contact office for sooner follow up if symptoms do not improve or worsen or  seek emergency care           , NP 09/19/2021

## 2021-10-14 ENCOUNTER — Telehealth: Payer: BC Managed Care – PPO | Admitting: Nurse Practitioner

## 2021-10-14 DIAGNOSIS — J45902 Unspecified asthma with status asthmaticus: Secondary | ICD-10-CM

## 2021-10-14 MED ORDER — PREDNISONE 20 MG PO TABS: 40.0000 mg | ORAL_TABLET | Freq: Every day | ORAL | 0 refills | Status: AC

## 2021-10-14 NOTE — Progress Notes (Signed)
Visit for Asthma  Based on what you have shared with me, it looks like you may have a flare up of your asthma.  Asthma is a chronic (ongoing) lung disease which results in airway obstruction, inflammation and hyper-responsiveness.   Asthma symptoms vary from person to person, with common symptoms including nighttime awakening and decreased ability to participate in normal activities as a result of shortness of breath. It is often triggered by changes in weather, changes in the season, changes in air temperature, or inside (home, school, daycare or work) allergens such as animal dander, mold, mildew, woodstoves or cockroaches.   It can also be triggered by hormonal changes, extreme emotion, physical exertion or an upper respiratory tract illness.     It is important to identify the trigger, and then eliminate or avoid the trigger if possible.   If you have been prescribed medications to be taken on a regular basis, it is important to follow the asthma action plan and to follow guidelines to adjust medication in response to increasing symptoms of decreased peak expiratory flow rate  Treatment: I have prescribed: Prednisone 40mg by mouth per day for 5 - 7 days  HOME CARE . Only take medications as instructed by your medical team. . Consider wearing a mask or scarf to improve breathing air temperature have been shown to decrease irritation and decrease exacerbations . Get rest. . Taking a steamy shower or using a humidifier may help nasal congestion sand ease sore throat pain. You can place a towel over your head and breathe in the steam from hot water coming from a faucet. . Using a saline nasal spray works much the same way.  . Cough drops, hare candies and sore throat lozenges may ease your cough.  . Avoid close contacts especially the very you and the elderly . Cover your mouth if you  cough or sneeze . Always remember to wash your hands.    GET HELP RIGHT AWAY IF: . You develop worsening symptoms; breathlessness at rest, drowsy, confused or agitated, unable to speak in full sentences . You have coughing fits . You develop a severe headache or visual changes . You develop shortness of breath, difficulty breathing or start having chest pain . Your symptoms persist after you have completed your treatment plan . If your symptoms do not improve within 10 days  MAKE SURE YOU . Understand these instructions. . Will watch your condition. . Will get help right away if you are not doing well or get worse.   Your e-visit answers were reviewed by a board certified advanced clinical practitioner to complete your personal care plan, Depending upon the condition, your plan could have included both over the counter or prescription medications.  Please review your pharmacy choice. Your safety is important to us. If you have drug allergies check your prescription carefully. You can use MyChart to ask questions about today's visit, request a non-urgent call back, or ask for a work or school excuse for 24 hours related to this e-Visit. If it has been greater than 24 hours you will need to follow up with your provider, or enter a new e-Visit to address those concerns.  You will get an e-mail in the next two days asking about your experience. I hope that your e-visit has been valuable and will speed your recovery. Thank you for using e-visits.  I have spent at least 5 minutes reviewing and documenting in the patient's chart. 

## 2021-10-16 MED ORDER — BUDESONIDE-FORMOTEROL FUMARATE 160-4.5 MCG/ACT IN AERO: 2.0000 | INHALATION_SPRAY | Freq: Two times a day (BID) | RESPIRATORY_TRACT | 5 refills | Status: DC

## 2021-10-16 NOTE — Telephone Encounter (Signed)
Sorry to hear that she is not feeling well.  Yes I would wait at least a week off the prednisone before getting lab works which she can do at [employer].  You can refill her Symbicort 160 2 puffs twice daily.  With 5 refills   please continue office visit recommendations and follow-up.    Please contact office for sooner follow up if symptoms do not improve or worsen or seek emergency care

## 2021-10-16 NOTE — Telephone Encounter (Signed)
Tammy, please see mychart message sent by pt which is posted below and advise: Joanne Molina "[name]"  P [facility] (supporting Parrett, Tammy S, NP) 3 hours ago (10:08 AM)   Hi Tammy,   I wasn't able to get my blood drawn as previously discussed in my recent appointment.  I had an urgent family emergency that severely disrupted my work schedule, etc.  I will return to the blood draw in just a moment.   My main concern is [place] and started having the same symptoms (drainage in my throat, coughing up white phlegm, wheezing, etc) on Friday 10/14.  Because I was out of town, I conducted an E-visit through [facility] and was prescribed 40 mg of prednisone.     My questions is two-fold:  do I need to come in prior to my lung function test to address the second flare-up?  And can I still have the blood draw performed at [facility]?  Do I need to wait again for the prednisone to clear my system as previously discussed?   If this needs to be discussed over the phone, I can gladly call and speak to a nurse.     Thank you, Joanne Molina

## 2021-11-10 ENCOUNTER — Other Ambulatory Visit: Payer: Self-pay | Admitting: Internal Medicine

## 2021-11-10 DIAGNOSIS — J45991 Cough variant asthma: Secondary | ICD-10-CM

## 2021-11-11 ENCOUNTER — Other Ambulatory Visit: Payer: Self-pay | Admitting: Internal Medicine

## 2021-11-13 ENCOUNTER — Ambulatory Visit: Payer: BC Managed Care – PPO | Admitting: Internal Medicine

## 2021-11-13 NOTE — Progress Notes (Deleted)
Joanne Molina, female    DOB: September 05, 1980,    MRN: [id number]   Brief patient profile:  39 yowf never smoker with "lifelong" recurrent cough once or twice a year esp in school and in cold weather  pattern then around 2017 pattern changed to cough and sob esp at hs assoc with subjective wheeze which did not really resolve between flares = x tol poor remained poor doe > to UC repeatedly rx  with neb and "all better for a day or two" pattern continued s maint rx until fall 2019 assoc with nasal congestion about 80% improved since since  maintained on Flovent 110  So referred to pulmonary clinic 05/14/2019 by Dorothyann Peng.   Nasal congestion x 2010 saw ENT  No better, no relation seasonal changes      History of Present Illness  05/14/2019  Pulmonary/ 1st office eval/Shiasia Porro on inderal 20 mg hs  Chief Complaint  Patient presents with   Pulmonary Consult    Referred by Dorothyann Peng, NP. Pt c/o cough and wheezing- occurs in the late summer through the winter.   Dyspnea:  Still some doe variable chest tight / wheeze  Cough: mostly dry assoc with nasal congestion  Sleep: better now on flovent  SABA use: last used 3 d prior to OV  / increase need in cold weather  rec Please remember to go to the lab department   for your tests - we will call you with the results when they are available. Stop flovent and inderal Start bisoprolol 5 mg daily  Plan A = Automatic = Dulera 100 1-2 puffs every 12 hours  Plan B = Backup Only use your albuterol inhaler as a rescue medication   On dulera 100 one bid could do anything she walk anywhere, no need for albuterol but nasal symptoms no better.   06/29/2019 lap hysterectomy > worse GERD on prilosec immediately post op despite prilosec 20 mg daily esp in evening despite prilosec 20 mg before supper.    09/10/2019 acute extended ov/Mliss Wedin re: cough variant asthma vs VCD flare with uri/ reflux  Chief Complaint  Patient presents with   Acute Visit    Pt c/o  increased cough x 2 wks- started as tickle in her throat and now coughing up white to light yellow sputum. She also c/o sinus congestion and sore throat. [name] using her rescue inhaler 1-2 x per day.   onset 2 weeks nasal congestion worse with tickle with yellow sputum / subjective wheeze  Trouble sleeping due to sob/cough despite 2 pillows hob  Already on dulera 100 2bid / started prednisone 40mg  started on 09/08/2019 and zpak  And changed to nexium 20 from prilosec 20 mg and no better so far  Albuterol total of 4 pffs  /day did not know max dose from  Rec Plan A = Automatic = Dulera 100 1-2 puffs every 12 hours  Plan B = Backup Only use your albuterol inhaler as a rescue medication Finish prednisone and zpak  The key to effective treatment for your cough is eliminating the non-stop cycle of cough  Take delsym two tsp every 12 hours and supplement if needed with  Tylenol #3   up to 1-2 every 4 hours to suppress the urge to cough. Swallowing water and/or using ice chips/non mint and menthol containing candies (such as lifesavers or sugarless jolly ranchers) are also effective.  You should rest your voice and avoid activities that you know make you cough.  Once you have eliminated the cough for 3 straight days try reducing the Tylenol #3 first,  then the delsym as tolerated.    Protonix (pantoprazole)  40 mg Take 30-60 min before first meal of the day and Pepcid 20 mg one after supper   GERD  diet/ lifestyle  We will  schedule sinus ct next avail and call you the results /- Sinus CT 10/01/2019: Positive for acute sinusitis with pansinus involvement.     NP recs  09/19/21  Finish Prednisone taper as directed.  Continue on Symbicort 160 2 puffs Twice daily  , rinse after use.  Add Singulair 10mg  At bedtime   Change Claritin to Zyrtec 10mg  At bedtime   Albuterol inhaler As needed   Labs at [facility]  Follow up with Dr.  in 6 weeks with PFT and As needed     11/13/2021  f/u ov/Cristan Scherzer re: uacs vs  cough variant asthma   maint on ***  No chief complaint on file.   Dyspnea:  *** Cough: *** Sleeping: *** SABA use: *** 02: *** Covid status:   ***   No obvious day to day or daytime variability or assoc excess/ purulent sputum or mucus plugs or hemoptysis or cp or chest tightness, subjective wheeze or overt sinus or hb symptoms.   *** without nocturnal  or early am exacerbation  of respiratory  c/o's or need for noct saba. Also denies any obvious fluctuation of symptoms with weather or environmental changes or other aggravating or alleviating factors except as outlined above   No unusual exposure hx or h/o childhood pna/ asthma or knowledge of premature birth.  Current Allergies, Complete Past Medical History, Past Surgical History, Family History, and Social History were reviewed in [facility] record.  ROS  The following are not active complaints unless bolded Hoarseness, sore throat, dysphagia, dental problems, itching, sneezing,  nasal congestion or discharge of excess mucus or purulent secretions, ear ache,   fever, chills, sweats, unintended wt loss or wt gain, classically pleuritic or exertional cp,  orthopnea pnd or arm/hand swelling  or leg swelling, presyncope, palpitations, abdominal pain, anorexia, nausea, vomiting, diarrhea  or change in bowel habits or change in bladder habits, change in stools or change in urine, dysuria, hematuria,  rash, arthralgias, visual complaints, headache, numbness, weakness or ataxia or problems with walking or coordination,  change in mood or  memory.        No outpatient medications have been marked as taking for the 11/13/21 encounter (Appointment) with [employer], MD.         Past Medical History:  Diagnosis Date   Anxiety    panic attack   Depression    Migraine        Objective:        11/13/2021     ***   09/10/19 179 lb (81.2 kg)  06/29/19 172 lb 2 oz (78.1 kg)  06/25/19 174 lb 2 oz (79 kg)     Vital signs reviewed  11/13/2021  - Note at rest 02 sats  ***% on ***   General appearance:    ***          Assessment

## 2021-12-26 ENCOUNTER — Other Ambulatory Visit: Payer: Self-pay | Admitting: Internal Medicine

## 2021-12-27 LAB — SARS CORONAVIRUS 2 (TAT 6-24 HRS): SARS Coronavirus 2: NEGATIVE

## 2021-12-29 ENCOUNTER — Ambulatory Visit (INDEPENDENT_AMBULATORY_CARE_PROVIDER_SITE_OTHER): Payer: BC Managed Care – PPO | Admitting: Internal Medicine

## 2021-12-29 ENCOUNTER — Ambulatory Visit (INDEPENDENT_AMBULATORY_CARE_PROVIDER_SITE_OTHER): Payer: BC Managed Care – PPO | Admitting: Primary Care

## 2021-12-29 ENCOUNTER — Encounter: Payer: Self-pay | Admitting: Primary Care

## 2021-12-29 ENCOUNTER — Telehealth: Payer: BC Managed Care – PPO | Admitting: Nurse Practitioner

## 2021-12-29 ENCOUNTER — Other Ambulatory Visit: Payer: Self-pay

## 2021-12-29 VITALS — BP 100/60 | HR 70 | Temp 98.1°F | Ht 66.0 in | Wt 172.2 lb

## 2021-12-29 DIAGNOSIS — J324 Chronic pansinusitis: Secondary | ICD-10-CM | POA: Diagnosis not present

## 2021-12-29 DIAGNOSIS — J45991 Cough variant asthma: Secondary | ICD-10-CM | POA: Diagnosis not present

## 2021-12-29 LAB — PULMONARY FUNCTION TEST
DL/VA % pred: 101 %
DL/VA: 4.43 ml/min/mmHg/L
DLCO cor % pred: 105 %
DLCO cor: 24.61 ml/min/mmHg
DLCO unc % pred: 105 %
DLCO unc: 24.61 ml/min/mmHg
FEF 25-75 Post: 2.62 L/sec
FEF 25-75 Pre: 2.46 L/sec
FEF2575-%Change-Post: 6 %
FEF2575-%Pred-Post: 81 %
FEF2575-%Pred-Pre: 76 %
FEV1-%Change-Post: 2 %
FEV1-%Pred-Post: 102 %
FEV1-%Pred-Pre: 100 %
FEV1-Post: 3.28 L
FEV1-Pre: 3.2 L
FEV1FVC-%Change-Post: 1 %
FEV1FVC-%Pred-Pre: 88 %
FEV6-%Change-Post: 1 %
FEV6-%Pred-Post: 115 %
FEV6-%Pred-Pre: 114 %
FEV6-Post: 4.47 L
FEV6-Pre: 4.4 L
FEV6FVC-%Pred-Post: 101 %
FEV6FVC-%Pred-Pre: 101 %
FVC-%Change-Post: 1 %
FVC-%Pred-Post: 113 %
FVC-%Pred-Pre: 112 %
FVC-Post: 4.47 L
FVC-Pre: 4.4 L
Post FEV1/FVC ratio: 73 %
Post FEV6/FVC ratio: 100 %
Pre FEV1/FVC ratio: 73 %
Pre FEV6/FVC Ratio: 100 %
RV % pred: 82 %
RV: 1.41 L
TLC % pred: 107 %
TLC: 5.78 L

## 2021-12-29 LAB — NITRIC OXIDE: FeNO level (ppb): 17

## 2021-12-29 MED ORDER — FLUTICASONE PROPIONATE 50 MCG/ACT NA SUSP: 2.0000 | Freq: Every day | NASAL | 6 refills | Status: DC

## 2021-12-29 NOTE — Progress Notes (Signed)
@Patient  ID: , female    DOB: 1980/07/12, 41 y.o.   MRN: [id number]  Chief Complaint  Patient presents with   Follow-up    PFT review    Referring provider: [id number], NP  HPI: 41 year old female, never smoked. PMH significant for HTN, cough variant asthma, chronic rhinitis, hysterectomy. Patient of Dr. 46, last seen by pulmonary NP on 09/19/21.  Previous [facility] pulmonary encounter: 09/19/2021 Follow up: Asthma  Patient returns for a follow-up visit.  Patient has underlying asthma.  She says she typically has a flare of her asthma every fall.  Over the last 2 years has been doing well up until this past 2 weeks.  Patient started having increased sinus congestion drainage.  She took a COVID test that was negative.  Symptoms continue to worsen despite using Mucinex Claritin and saline nasal rinses.  She was seen at a local urgent care and given a Z-Pak and a prednisone taper.  Patient said her symptoms continued without significant improvement.  She went to the emergency room on September 13, 2021.  COVID-19 test was negative.  Chest x-ray showed no acute process.  She was given a Depo-Medrol injection and started on a Medrol Dosepak.  Patient says yesterday she started having some improvement in symptoms.  She also changed from Symbicort 80 daily to Symbicort 160 twice daily.  She denies any hemoptysis chest pain orthopnea PND.  [name] a September 15, 2021.  She does not have any pets.  12/29/2021- Interim hx  Patient presents today for 3 month follow-up with PFTs. During last visit she was started on Singulair 10mg  at bedtime and changed from Claritin to Zyrtec. [name] maintained on Symbicort 12/31/2021 two puffs twice daily. She had RSV three weeks ago, she was evaluated by UC and she was treated with steroid dose pack. She has fully recovered. She feels higher dose Symbicort has made a big difference in her brething. She feels much improved since medications changes. Eos absolute 200  in March 2022. She did not have lab work done but will today.   Pulmonary function testing: 12/29/2021 FVC 4.47 (113%); FEV1 3.28 (102%), ratio 73, TLC 107%, DLCOunc 105% p using Symbicort 160 this morning. NO BD response.     Allergies  Allergen Reactions   Erythromycin Nausea Only    Immunization History  Administered Date(s) Administered   Influenza Split 09/09/2012   Tdap 05/04/2016    Past Medical History:  Diagnosis Date   Anxiety    panic attack   Cough variant asthma 05/14/2019   Depression    Gastropathy 10/05/2019   Migraine    S/P laparoscopic hysterectomy 06/29/2019    Tobacco History: Social History   Tobacco Use  Smoking Status Never  Smokeless Tobacco Never   Counseling given: Not Answered   Outpatient Medications Prior to Visit  Medication Sig Dispense Refill   albuterol (VENTOLIN HFA) 108 (90 Base) MCG/ACT inhaler Inhale 1 puff into the lungs every 6 (six) hours as needed for wheezing or shortness of breath. 18 g 11   ALPRAZolam (XANAX) 1 MG tablet Take 1 tablet (1 mg total) by mouth 2 (two) times daily as needed for anxiety. 60 tablet 5   bisoprolol (ZEBETA) 5 MG tablet TAKE 1 TABLET BY MOUTH EVERY DAY 30 tablet 3   budesonide-formoterol (SYMBICORT) 160-4.5 MCG/ACT inhaler Inhale 2 puffs into the lungs 2 (two) times daily. 10 g 5   montelukast (SINGULAIR) 10 MG tablet Take 1 tablet (10  mg total) by mouth at bedtime. 30 tablet 11   pantoprazole (PROTONIX) 40 MG tablet TAKE 1 TABLET BY MOUTH EVERY DAY 90 tablet 1   sertraline (ZOLOFT) 100 MG tablet Take 2.5 tablets (250 mg total) by mouth daily. 225 tablet 1   No facility-administered medications prior to visit.   Review of Systems  Review of Systems  Constitutional: Negative.   HENT: Negative.    Respiratory:  Negative for cough, chest tightness, shortness of breath and wheezing.     Physical Exam  BP 100/60 (BP Location: Left Arm, Cuff Size: Normal)    Pulse 70    Temp 98.1 F (36.7 C)  (Oral)    Ht 5\' 6"  (1.676 m)    Wt 172 lb 3.2 oz (78.1 kg)    LMP 06/02/2019 (Approximate)    SpO2 98%    BMI 27.79 kg/m  Physical Exam Constitutional:      Appearance: Normal appearance.  HENT:     Head: Normocephalic and atraumatic.     Mouth/Throat:     Mouth: Mucous membranes are moist.     Pharynx: Oropharynx is clear.  Cardiovascular:     Rate and Rhythm: Normal rate and regular rhythm.  Pulmonary:     Breath sounds: No wheezing, rhonchi or rales.     Comments: CTA Musculoskeletal:        General: Normal range of motion.  Skin:    General: Skin is warm and dry.  Neurological:     General: No focal deficit present.     Mental Status: [name] alert and oriented to person, place, and time. Mental status is at baseline.  Psychiatric:        Mood and Affect: Mood normal.        Behavior: Behavior normal.        Thought Content: Thought content normal.        Judgment: Judgment normal.     Lab Results:  CBC    Component Value Date/Time   WBC 8.0 03/21/2021 1620   RBC 4.49 03/21/2021 1620   HGB 14.3 03/21/2021 1620   HGB 14.0 10/23/2017 0920   HCT 42.0 03/21/2021 1620   HCT 40.1 10/23/2017 0920   PLT 281.0 03/21/2021 1620   PLT 276 10/23/2017 0920   MCV 93.7 03/21/2021 1620   MCV 90 10/23/2017 0920   MCH 30.2 06/30/2019 0517   MCHC 33.9 03/21/2021 1620   RDW 13.6 03/21/2021 1620   RDW 13.2 10/23/2017 0920   LYMPHSABS 1.8 03/21/2021 1620   MONOABS 0.5 03/21/2021 1620   EOSABS 0.2 03/21/2021 1620   BASOSABS 0.0 03/21/2021 1620    BMET    Component Value Date/Time   NA 140 06/25/2019 1454   NA 141 10/23/2017 0920   K 4.4 06/25/2019 1454   CL 109 06/25/2019 1454   CO2 26 06/25/2019 1454   GLUCOSE 111 (H) 06/25/2019 1454   BUN 16 06/25/2019 1454   BUN 17 10/23/2017 0920   CREATININE 0.97 06/25/2019 1454   CALCIUM 8.9 06/25/2019 1454   GFRNONAA >60 06/25/2019 1454   GFRAA >60 06/25/2019 1454    BNP No results found for: BNP  ProBNP No results found  for: PROBNP  Imaging: No results found.   Assessment & Plan:   Cough variant asthma - Improved significantly since medication changes in September 2022. - Spirometry was normal today, however, patient took ICS/LABA maintenance inhaler prior to testing this morning. FENO 17. - Continue Symbicort 160 two puffs BID -  Continue Singulair 10mg  at bedtime - Needs IGE/CBC with diff  - FU in 6 months with Dr. Melvyn Novas or sooner if needed      Martyn Ehrich, NP 12/29/2021

## 2021-12-29 NOTE — Patient Instructions (Addendum)
Recommendations: - Continue Symbicort 160 two puffs morning and evening - Continue Singulair 10mg  at bedtime  - Use albuterol rescue inhaler 2 puffs every 4-6 hours as needed for breakthrough shortness for breath or wheezing  - Please present to [facility] today or some time next week at our office for lab work   Orders: - Labs (IgE and CBC with diff) - FENO today  Follow-up: - 6 months with Dr. or sooner if needed     Asthma, Adult Asthma is a long-term (chronic) condition in which the airways get tight and narrow. The airways are the breathing passages that lead from the nose and mouth down into the lungs. A person with asthma will have times when symptoms get worse. These are called asthma attacks. They can cause coughing, whistling sounds when you breathe (wheezing), shortness of breath, and chest pain. They can make it hard to breathe. There is no cure for asthma, but medicines and lifestyle changes can help control it. There are many things that can bring on an asthma attack or make asthma symptoms worse (triggers). Common triggers include: Mold. Dust. Cigarette smoke. Cockroaches. Things that can cause allergy symptoms (allergens). These include animal skin flakes (dander) and pollen from trees or grass. Things that pollute the air. These may include household cleaners, wood smoke, smog, or chemical odors. Cold air, weather changes, and wind. Crying or laughing hard. Stress. Certain medicines or drugs. Certain foods such as dried fruit, potato chips, and grape juice. Infections, such as a cold or the flu. Certain medical conditions or diseases. Exercise or tiring activities. Asthma may be treated with medicines and by staying away from the things that cause asthma attacks. Types of medicines may include: Controller medicines. These help prevent asthma symptoms. They are usually taken every day. Fast-acting reliever or rescue medicines. These quickly relieve asthma symptoms.  They are used as needed and provide short-term relief. Allergy medicines if your attacks are brought on by allergens. Medicines to help control the body's defense (immune) system. Follow these instructions at home: Avoiding triggers in your home Change your heating and air conditioning filter often. Limit your use of fireplaces and wood stoves. Get rid of pests (such as roaches and mice) and their droppings. Throw away plants if you see mold on them. Clean your floors. Dust regularly. Use cleaning products that do not smell. Have someone vacuum when you are not home. Use a vacuum cleaner with a HEPA filter if possible. Replace carpet with wood, tile, or vinyl flooring. Carpet can trap animal skin flakes and dust. Use allergy-proof pillows, mattress covers, and box spring covers. Wash bed sheets and blankets every week in hot water. Dry them in a dryer. Keep your bedroom free of any triggers. Avoid pets and keep windows closed when things that cause allergy symptoms are in the air. Use blankets that are made of polyester or cotton. Clean bathrooms and kitchens with bleach. If possible, have someone repaint the walls in these rooms with mold-resistant paint. Keep out of the rooms that are being cleaned and painted. Wash your hands often with soap and water. If soap and water are not available, use hand sanitizer. Do not allow anyone to smoke in your home. General instructions Take over-the-counter and prescription medicines only as told by your doctor. Talk with your doctor if you have questions about how or when to take your medicines. Make note if you need to use your medicines more often than usual. Do not use any products  that contain nicotine or tobacco, such as cigarettes and e-cigarettes. If you need help quitting, ask your doctor. Stay away from secondhand smoke. Avoid doing things outdoors when allergen counts are high and when air quality is low. Wear a ski mask when doing outdoor  activities in the winter. The mask should cover your nose and mouth. Exercise indoors on cold days if you can. Warm up before you exercise. Take time to cool down after exercise. Use a peak flow meter as told by your doctor. A peak flow meter is a tool that measures how well the lungs are working. Keep track of the peak flow meter's readings. Write them down. Follow your asthma action plan. This is a written plan for taking care of your asthma and treating your attacks. Make sure you get all the shots (vaccines) that your doctor recommends. Ask your doctor about a flu shot and a pneumonia shot. Keep all follow-up visits as told by your doctor. This is important. Contact a doctor if: You have wheezing, shortness of breath, or a cough even while taking medicine to prevent attacks. The mucus you cough up (sputum) is thicker than usual. The mucus you cough up changes from clear or white to yellow, green, gray, or bloody. You have problems from the medicine you are taking, such as: A rash. Itching. Swelling. Trouble breathing. You need reliever medicines more than 2-3 times a week. Your peak flow reading is still at 50-79% of your personal best after following the action plan for 1 hour. You have a fever. Get help right away if: You seem to be worse and are not responding to medicine during an asthma attack. You are short of breath even at rest. You get short of breath when doing very little activity. You have trouble eating, drinking, or talking. You have chest pain or tightness. You have a fast heartbeat. Your lips or fingernails start to turn blue. You are light-headed or dizzy, or you faint. Your peak flow is less than 50% of your personal best. You feel too tired to breathe normally. Summary Asthma is a long-term (chronic) condition in which the airways get tight and narrow. An asthma attack can make it hard to breathe. Asthma cannot be cured, but medicines and lifestyle changes can  help control it. Make sure you understand how to avoid triggers and how and when to use your medicines. This information is not intended to replace advice given to you by your health care provider. Make sure you discuss any questions you have with your health care provider. Document Revised: 04/10/2020 Document Reviewed: 04/20/2020 Elsevier Patient Education  2022 [employer].

## 2021-12-29 NOTE — Progress Notes (Signed)
E-Visit for Sinus Problems  We are sorry that you are not feeling well.  Here is how we plan to help!  Based on what you have shared with me it looks like you have sinusitis.  Sinusitis is inflammation and infection in the sinus cavities of the head.  Based on your presentation I believe you most likely have Acute Viral Sinusitis.This is an infection most likely caused by a virus. There is not specific treatment for viral sinusitis other than to help you with the symptoms until the infection runs its course.  You may use an oral decongestant such as Mucinex D or if you have glaucoma or high blood pressure use plain Mucinex. Saline nasal spray help and can safely be used as often as needed for congestion, I have prescribed: Fluticasone nasal spray two sprays in each nostril once a day  Providers prescribe antibiotics to treat infections caused by bacteria. Antibiotics are very powerful in treating bacterial infections when they are used properly. To maintain their effectiveness, they should be used only when necessary. Overuse of antibiotics has resulted in the development of superbugs that are resistant to treatment!  Sinus infections are not typically bacterial before 7+ days of continued congestion.   After careful review of your answers, I would not recommend an antibiotic for your condition.  Antibiotics are not effective against viruses and therefore should not be used to treat them. Common examples of infections caused by viruses include colds and flu   Some authorities believe that zinc sprays or the use of Echinacea may shorten the course of your symptoms.  Sinus infections are not as easily transmitted as other respiratory infection, however we still recommend that you avoid close contact with loved ones, especially the very young and elderly.  Remember to wash your hands thoroughly throughout the day as this is the number one way to prevent the spread of infection!  Home Care: Only take  medications as instructed by your medical team. Do not take these medications with alcohol. A steam or ultrasonic humidifier can help congestion.  You can place a towel over your head and breathe in the steam from hot water coming from a faucet. Avoid close contacts especially the very young and the elderly. Cover your mouth when you cough or sneeze. Always remember to wash your hands.  Get Help Right Away If: You develop worsening fever or sinus pain. You develop a severe head ache or visual changes. Your symptoms persist after you have completed your treatment plan.  Make sure you Understand these instructions. Will watch your condition. Will get help right away if you are not doing well or get worse.   Thank you for choosing an e-visit.  Your e-visit answers were reviewed by a board certified advanced clinical practitioner to complete your personal care plan. Depending upon the condition, your plan could have included both over the counter or prescription medications.  Please review your pharmacy choice. Make sure the pharmacy is open so you can pick up prescription now. If there is a problem, you may contact your provider through [employer] and have the prescription routed to another pharmacy.  Your safety is important to [place]. If you have drug allergies check your prescription carefully.   For the next 24 hours you can use MyChart to ask questions about today's visit, request a non-urgent call back, or ask for a work or school excuse. You will get an email in the next two days asking about your experience. I  hope that your e-visit has been valuable and will speed your recovery.   I spent approximately 5 minutes reviewing the patient's history, current symptoms and coordinating their plan of care today.

## 2021-12-29 NOTE — Assessment & Plan Note (Signed)
-   Improved significantly since medication changes in September 2022. - Spirometry was normal today, however, patient took ICS/LABA maintenance inhaler prior to testing this morning. FENO 17. - Continue Symbicort 160 two puffs BID - Continue Singulair 10mg  at bedtime - Needs IGE/CBC with diff  - FU in 6 months with Dr. or sooner if needed

## 2021-12-29 NOTE — Progress Notes (Signed)
PFT done today. 

## 2022-01-02 ENCOUNTER — Other Ambulatory Visit (INDEPENDENT_AMBULATORY_CARE_PROVIDER_SITE_OTHER): Payer: BC Managed Care – PPO

## 2022-01-02 DIAGNOSIS — J45991 Cough variant asthma: Secondary | ICD-10-CM | POA: Diagnosis not present

## 2022-01-02 LAB — CBC WITH DIFFERENTIAL/PLATELET
Basophils Absolute: 0 10*3/uL (ref 0.0–0.1)
Basophils Relative: 0.4 % (ref 0.0–3.0)
Eosinophils Absolute: 0.2 10*3/uL (ref 0.0–0.7)
Eosinophils Relative: 3.1 % (ref 0.0–5.0)
HCT: 42.9 % (ref 36.0–46.0)
Hemoglobin: 14.4 g/dL (ref 12.0–15.0)
Lymphocytes Relative: 21.7 % (ref 12.0–46.0)
Lymphs Abs: 1.4 10*3/uL (ref 0.7–4.0)
MCHC: 33.6 g/dL (ref 30.0–36.0)
MCV: 92.4 fl (ref 78.0–100.0)
Monocytes Absolute: 0.3 10*3/uL (ref 0.1–1.0)
Monocytes Relative: 4.9 % (ref 3.0–12.0)
Neutro Abs: 4.5 10*3/uL (ref 1.4–7.7)
Neutrophils Relative %: 69.9 % (ref 43.0–77.0)
Platelets: 277 10*3/uL (ref 150.0–400.0)
RBC: 4.64 Mil/uL (ref 3.87–5.11)
RDW: 12.5 % (ref 11.5–15.5)
WBC: 6.4 10*3/uL (ref 4.0–10.5)

## 2022-01-03 LAB — IGE: IgE (Immunoglobulin E), Serum: 44 kU/L (ref <=114)

## 2022-01-10 NOTE — Progress Notes (Signed)
Called pt and there was no answer-LMTCB °

## 2022-01-17 NOTE — Progress Notes (Signed)
Dr Sherene Sires- this result note is attached to a PFT results that was already resulted as normal and we mailed her a letter. Please review her chart and advise what this last result note is pertaining to. Thanks!

## 2022-01-23 NOTE — Progress Notes (Signed)
CBC and IGE were normal

## 2022-01-29 ENCOUNTER — Other Ambulatory Visit: Payer: Self-pay | Admitting: Physician Assistant

## 2022-02-28 ENCOUNTER — Ambulatory Visit: Payer: BC Managed Care – PPO | Admitting: Physician Assistant

## 2022-02-28 ENCOUNTER — Encounter: Payer: Self-pay | Admitting: Physician Assistant

## 2022-02-28 ENCOUNTER — Other Ambulatory Visit: Payer: Self-pay

## 2022-02-28 ENCOUNTER — Encounter: Payer: Self-pay | Admitting: Cardiology

## 2022-02-28 DIAGNOSIS — F411 Generalized anxiety disorder: Secondary | ICD-10-CM

## 2022-02-28 DIAGNOSIS — R002 Palpitations: Secondary | ICD-10-CM

## 2022-02-28 DIAGNOSIS — F3341 Major depressive disorder, recurrent, in partial remission: Secondary | ICD-10-CM | POA: Diagnosis not present

## 2022-02-28 DIAGNOSIS — F410 Panic disorder [episodic paroxysmal anxiety] without agoraphobia: Secondary | ICD-10-CM

## 2022-02-28 MED ORDER — ALPRAZOLAM 1 MG PO TABS: 1.0000 mg | ORAL_TABLET | Freq: Two times a day (BID) | ORAL | 1 refills | Status: DC | PRN

## 2022-02-28 MED ORDER — GABAPENTIN 300 MG PO CAPS: 300.0000 mg | ORAL_CAPSULE | Freq: Three times a day (TID) | ORAL | 1 refills | Status: DC

## 2022-02-28 MED ORDER — SERTRALINE HCL 100 MG PO TABS: 250.0000 mg | ORAL_TABLET | Freq: Every day | ORAL | 1 refills | Status: DC

## 2022-02-28 NOTE — Progress Notes (Signed)
[facility] ? ?Patient ID: [name],  ?MRN: [id number] ? ?PCP: Shirline Frees, NP ? ?Date of Evaluation: 02/28/2022 ?Time spent:40 minutes ? ?Chief Complaint:  ?Chief Complaint   ?Anxiety; Depression; Follow-up ?  ? ? ?HISTORY/CURRENT STATUS: ?For routine med check. 3 months overdue for routine appt. ? ?Under a lot of stress, her 42 yo niece has recurrent glioblastoma, her Mom has gotten psychotic and is now sees Edison Pace, NP here in our office and is better, but she doesn't want to be left alone and has been staying with [name] for about 6 months, even sleeps with her.  [name] really overwhelmed with all the stress.  On top of all that, she has started having the abnormal heart rate again, is now on bisoprolol for months now maybe a year or so.  She has an apple watch now and it records her heart rate 24/7 has noticed that her resting heart rate at night will go into the 40s but sometimes go as high as 152, like occurred 1 night last week when her heart rate was 152 and it woke her up, she was understandably in a panic.  She has not seen her cardiologist in about a year but has contacted them today to try to get an appointment.  She does take Xanax 1 mg every night, but the other night she had to take an extra 1/2 pill when she was so panicked when she woke up.  She usually does not take it twice a day. ? ?[name] doing well as far as the depression goes.  [name] able to enjoy things although with all the stress she does not have a lot of time to do much of anything.  Her kids are doing well, 1 kindergarten age and the other going into middle school.  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  She really enjoys what she does.  Until all this stress started she was taking the Xanax only in the evening.  It helps her relax so she can go to sleep.  Now is having to take it a bit more often.  No suicidal or homicidal thoughts. ? ?Patient denies increased energy with decreased need for  sleep, no increased talkativeness, no racing thoughts, no impulsivity or risky behaviors, no increased spending, no increased libido, no paranoia, no grandiosity, no hallucinations. ? ?Review of Systems  ?Constitutional: Negative.   ?HENT: Negative.    ?Eyes: Negative.   ?Respiratory: Negative.    ?Cardiovascular:  Positive for palpitations.  ?     See HPI  ?Gastrointestinal: Negative.   ?Genitourinary: Negative.   ?Musculoskeletal: Negative.   ?Skin: Negative.   ?Neurological: Negative.   ?Endo/Heme/Allergies: Negative.   ?Psychiatric/Behavioral:    ?     See HPI  ? ?Individual Medical History/ Review of Systems: Changes? :No   ? ?Past medications for mental health diagnoses include: ?Sonata, Paxil, Celexa, Effexor, BuSpar made the anxiety worse, Ativan, Klonopin, Prozac, Xanax, Ambien, Lunesta, trazodone ? ?Allergies: Erythromycin ? ?Current Medications:  ?Current Outpatient Medications:  ?  albuterol (VENTOLIN HFA) 108 (90 Base) MCG/ACT inhaler, Inhale 1 puff into the lungs every 6 (six) hours as needed for wheezing or shortness of breath., Disp: 18 g, Rfl: 11 ?  bisoprolol (ZEBETA) 5 MG tablet, TAKE 1 TABLET BY MOUTH EVERY DAY, Disp: 30 tablet, Rfl: 3 ?  budesonide-formoterol (SYMBICORT) 160-4.5 MCG/ACT inhaler, Inhale 2 puffs into the lungs 2 (two) times daily., Disp: 10  g, Rfl: 5 ?  gabapentin (NEURONTIN) 300 MG capsule, Take 1 capsule (300 mg total) by mouth 3 (three) times daily., Disp: 90 capsule, Rfl: 1 ?  montelukast (SINGULAIR) 10 MG tablet, Take 1 tablet (10 mg total) by mouth at bedtime., Disp: 30 tablet, Rfl: 11 ?  pantoprazole (PROTONIX) 40 MG tablet, TAKE 1 TABLET BY MOUTH EVERY DAY, Disp: 90 tablet, Rfl: 1 ?  ALPRAZolam (XANAX) 1 MG tablet, Take 1 tablet (1 mg total) by mouth 2 (two) times daily as needed for anxiety., Disp: 60 tablet, Rfl: 1 ?  fluticasone (FLONASE) 50 MCG/ACT nasal spray, Place 2 sprays into both nostrils daily. (Patient not taking: Reported on 02/28/2022), Disp: 16 g, Rfl: 6 ?   sertraline (ZOLOFT) 100 MG tablet, Take 2.5 tablets (250 mg total) by mouth daily., Disp: 225 tablet, Rfl: 1 ?Medication Side Effects: none ? ?Family Medical/ Social History: Changes? No ? ?MENTAL HEALTH EXAM: ? ?Last menstrual period 06/02/2019, unknown if currently breastfeeding.There is no height or weight on file to calculate BMI.  ?General Appearance: Casual, Neat and Well Groomed  ?Eye Contact:  Good  ?Speech:  Clear and Coherent and Normal Rate  ?Volume:  Normal  ?Mood:  Euthymic  ?Affect:  Appropriate   ?Thought Process:  Goal Directed and Descriptions of Associations: Circumstantial  ?Orientation:  Full (Time, Place, and Person)  ?Thought Content: Logical   ?Suicidal Thoughts:  No  ?Homicidal Thoughts:  No  ?Memory:  WNL  ?Judgement:  Good  ?Insight:  Good  ?Psychomotor Activity:  Normal  ?Concentration:  Concentration: Good  ?Recall:  Good  ?Fund of Knowledge: Good  ?Language: Good  ?Assets:  Desire for Improvement  ?ADL's:  Intact  ?Cognition: WNL  ?Prognosis:  Good  ? ? ? ?DIAGNOSES:  ?  ICD-10-CM   ?1. Generalized anxiety disorder  F41.1   ?  ?2. Panic disorder  F41.0   ?  ?3. Recurrent major depressive disorder, in partial remission (HCC)  F33.41   ?  ?4. Palpitations  R00.2   ?  ? ? ? ? ?Receiving Psychotherapy: No  ? ? ?RECOMMENDATIONS:  ?PDMP was reviewed.  Xanax last filled 01/30/2022.   ?I provided 40 minutes of face to face time during this encounter, including time spent before and after the visit in records review, medical decision making, counseling pertinent to today's visit, and charting.  ?We discussed the diagnosis of anxiety.  Unfortunately she is under a lot of external stressors, with the illness of her niece and her mom.  Options medication wise could be increasing the Zoloft although she is on a rather high dose already.  She has tried BuSpar in the past and it actually made the anxiety worse.  She has been on gabapentin in the past for neck pain and neuropathy prior to surgery and she  knows that she tolerated that well.  I explained that this is used often times for anxiety so recommend that be restarted.  Benefits, risks and side effects were discussed and she accepts.  I will write the prescription for 3 times daily dosing but if it makes her too sleepy and she is feeling better with just once in the evening or once twice daily, she can leave it at that. ?No other changes will be made. ?She will be seeing her cardiologist soon.  She knows to go to the ER if the palpitations or tachycardia should worsen or should she have chest pain with those symptoms. ? ?Start gabapentin 300 mg, 1  p.o. nightly for 2 nights then 1 p.o. twice daily for 2 days if tolerated, then increase to 1 p.o. 3 times daily. ?Continue Zoloft 100 mg, 2.5 pills daily. ?Continue Xanax 1 mg 1 p.o. twice daily as needed anxiety. ?Return in 2 months. ? ?Melony Overly, PA-C  ?

## 2022-03-05 NOTE — Telephone Encounter (Signed)
Left message for pt to call back  °

## 2022-03-08 ENCOUNTER — Telehealth: Payer: Self-pay | Admitting: Cardiology

## 2022-03-08 NOTE — Telephone Encounter (Signed)
Patient states she is returning a call from earlier this afternoon. I did not see any notes. ?

## 2022-03-08 NOTE — Telephone Encounter (Signed)
Spoke to patient she stated she was returning a phone call.Unable to know who called no documentation.She stated she has been having episodes of fast heart beat.Appointment was scheduled with Dr.Tobb in April.Appointment moved up to 3/15 at 11:40 am.I will make Dr.Tobb's RN aware. ?

## 2022-03-14 ENCOUNTER — Ambulatory Visit: Payer: BC Managed Care – PPO | Admitting: Cardiology

## 2022-04-11 ENCOUNTER — Ambulatory Visit: Payer: BC Managed Care – PPO | Admitting: Obstetrics and Gynecology
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
## 2022-04-13 ENCOUNTER — Ambulatory Visit (HOSPITAL_BASED_OUTPATIENT_CLINIC_OR_DEPARTMENT_OTHER): Payer: BC Managed Care – PPO | Admitting: Nurse Practitioner

## 2022-04-13 ENCOUNTER — Ambulatory Visit: Payer: BC Managed Care – PPO | Admitting: Cardiology

## 2022-04-13 ENCOUNTER — Encounter (HOSPITAL_BASED_OUTPATIENT_CLINIC_OR_DEPARTMENT_OTHER): Payer: Self-pay | Admitting: Nurse Practitioner

## 2022-04-13 ENCOUNTER — Ambulatory Visit (INDEPENDENT_AMBULATORY_CARE_PROVIDER_SITE_OTHER): Payer: BC Managed Care – PPO

## 2022-04-13 VITALS — BP 110/80 | HR 80 | Ht 66.0 in | Wt 180.6 lb

## 2022-04-13 DIAGNOSIS — R002 Palpitations: Secondary | ICD-10-CM

## 2022-04-13 DIAGNOSIS — R0602 Shortness of breath: Secondary | ICD-10-CM | POA: Diagnosis not present

## 2022-04-13 DIAGNOSIS — F419 Anxiety disorder, unspecified: Secondary | ICD-10-CM

## 2022-04-13 NOTE — Progress Notes (Unsigned)
[id number] Preventice event monitor from office inventory applied to patient. ? ?Duplicate orders entered.  Monitor order was used to scan in an ekg and marked final. IT notified. ?

## 2022-04-13 NOTE — Patient Instructions (Addendum)
Medication Instructions:  ?Your physician recommends that you continue on your current medications as directed. Please refer to the Current Medication list given to you today. ? ?*If you need a refill on your cardiac medications before your next appointment, please call your pharmacy* ? ? ?Lab Work: ?Your physician recommends that you return for lab work in: TODAY- BMET, TSH, MAG ?If you have labs (blood work) drawn today and your tests are completely normal, you will receive your results only by: ?MyChart Message (if you have MyChart) OR ?A paper copy in the mail ?If you have any lab test that is abnormal or we need to change your treatment, we will call you to review the results. ? ? ?Testing/Procedures: ?Your physician has recommended that you wear an 30 DAY event monitor. Event monitors are medical devices that record the heart?s electrical activity. Doctors most often [place] these monitors to diagnose arrhythmias. Arrhythmias are problems with the speed or rhythm of the heartbeat. The monitor is a small, portable device. You can wear one while you do your normal daily activities. This is usually used to diagnose what is causing palpitations/syncope (passing out). ?MONITOR BEING PUT ON IN OFFICE TODAY. ? ? ?Follow-Up: ?At [facility], you and your health needs are our priority.  As part of our continuing mission to provide you with exceptional heart care, we have created designated Provider Care Teams.  These Care Teams include your primary Cardiologist (physician) and Advanced Practice Providers (APPs -  Physician Assistants and Nurse Practitioners) who all work together to provide you with the care you need, when you need it. ? ?We recommend signing up for the patient portal called "MyChart".  Sign up information is provided on this After Visit Summary.  MyChart is used to connect with patients for Virtual Visits (Telemedicine).  Patients are able to view lab/test results, encounter notes, upcoming  appointments, etc.  Non-urgent messages can be sent to your provider as well.   ?To learn more about what you can do with MyChart, go to [email or web address].   ? ?Your next appointment:   ?3 month(s) ? ?The format for your next appointment:   ?In Person ? ?Provider:   ?Thomasene Ripple, DO   ? ? ?Other Instructions ?To prevent palpitations: ?Make sure you are adequately hydrated.  ?Avoid and/or limit caffeine containing beverages like soda or tea. ?Exercise regularly.  ?Manage stress well. ?Some over the counter medications can cause palpitations such as Benadryl, AdvilPM, TylenolPM. Regular Advil or Tylenol do not cause palpitations.  ? ? ?Important Information About Sugar ? ? ? ? ?  ?

## 2022-04-14 LAB — BASIC METABOLIC PANEL
BUN/Creatinine Ratio: 16 (ref 9–23)
BUN: 17 mg/dL (ref 6–24)
CO2: 23 mmol/L (ref 20–29)
Calcium: 9.8 mg/dL (ref 8.7–10.2)
Chloride: 104 mmol/L (ref 96–106)
Creatinine, Ser: 1.04 mg/dL — ABNORMAL HIGH (ref 0.57–1.00)
Glucose: 87 mg/dL (ref 70–99)
Potassium: 4.2 mmol/L (ref 3.5–5.2)
Sodium: 141 mmol/L (ref 134–144)
eGFR: 69 mL/min/{1.73_m2} (ref >59)

## 2022-04-14 LAB — MAGNESIUM: Magnesium: 2.5 mg/dL — ABNORMAL HIGH (ref 1.6–2.3)

## 2022-04-14 LAB — TSH: TSH: 1.91 u[IU]/mL (ref 0.450–4.500)

## 2022-06-07 ENCOUNTER — Telehealth: Payer: BC Managed Care – PPO | Admitting: Physician Assistant

## 2022-06-07 DIAGNOSIS — R3989 Other symptoms and signs involving the genitourinary system: Secondary | ICD-10-CM | POA: Diagnosis not present

## 2022-06-07 MED ORDER — CEPHALEXIN 500 MG PO CAPS: 500.0000 mg | ORAL_CAPSULE | Freq: Two times a day (BID) | ORAL | 0 refills | Status: AC

## 2022-06-07 NOTE — Progress Notes (Signed)

## 2022-06-07 NOTE — Progress Notes (Signed)
I have spent 5 minutes in review of e-visit questionnaire, review and updating patient chart, medical decision making and response to patient.   Maille Halliwell Cody Ramadan Couey, PA-C    

## 2022-06-14 MED ORDER — BISOPROLOL FUMARATE 5 MG PO TABS: 5.0000 mg | ORAL_TABLET | Freq: Every day | ORAL | 3 refills | Status: DC

## 2022-06-19 ENCOUNTER — Other Ambulatory Visit: Payer: Self-pay | Admitting: Internal Medicine

## 2022-06-19 ENCOUNTER — Other Ambulatory Visit: Payer: Self-pay | Admitting: Physician Assistant

## 2022-06-20 NOTE — Telephone Encounter (Signed)
Please call patient to schedule an appt. Last seen 3/1 with RTC in 2 mo.

## 2022-07-13 ENCOUNTER — Other Ambulatory Visit: Payer: Self-pay | Admitting: Physician Assistant

## 2022-07-21 ENCOUNTER — Other Ambulatory Visit: Payer: Self-pay | Admitting: Adult Health

## 2022-07-23 ENCOUNTER — Ambulatory Visit: Payer: BC Managed Care – PPO | Admitting: Cardiology

## 2022-08-07 ENCOUNTER — Encounter: Payer: Self-pay | Admitting: Cardiology

## 2022-08-22 ENCOUNTER — Other Ambulatory Visit: Payer: Self-pay | Admitting: Physician Assistant

## 2022-08-22 NOTE — Telephone Encounter (Signed)
Please schedule appt

## 2022-08-24 NOTE — Telephone Encounter (Signed)
Pt is scheduled for 9/1. Please send in meds she is out

## 2022-08-24 NOTE — Telephone Encounter (Signed)
LVM to schedule follow up

## 2022-08-31 ENCOUNTER — Telehealth: Payer: BC Managed Care – PPO | Admitting: Physician Assistant

## 2022-08-31 ENCOUNTER — Encounter: Payer: Self-pay | Admitting: Physician Assistant

## 2022-08-31 DIAGNOSIS — F3341 Major depressive disorder, recurrent, in partial remission: Secondary | ICD-10-CM | POA: Diagnosis not present

## 2022-08-31 DIAGNOSIS — F411 Generalized anxiety disorder: Secondary | ICD-10-CM

## 2022-08-31 DIAGNOSIS — F4321 Adjustment disorder with depressed mood: Secondary | ICD-10-CM

## 2022-08-31 DIAGNOSIS — F410 Panic disorder [episodic paroxysmal anxiety] without agoraphobia: Secondary | ICD-10-CM

## 2022-08-31 MED ORDER — ALPRAZOLAM 1 MG PO TABS: ORAL_TABLET | ORAL | 5 refills | Status: DC

## 2022-08-31 MED ORDER — SERTRALINE HCL 100 MG PO TABS: ORAL_TABLET | ORAL | 1 refills | Status: DC

## 2022-08-31 MED ORDER — GABAPENTIN 300 MG PO CAPS: ORAL_CAPSULE | ORAL | 1 refills | Status: DC

## 2022-08-31 NOTE — Progress Notes (Unsigned)
[facility]  Patient ID: Joanne Molina,  MRN: [id number]  PCP: Joanne Frees, NP  Date of Evaluation: 08/31/2022 Time spent:40 minutes  Chief Complaint:  Chief Complaint   Anxiety; Depression; Follow-up     HISTORY/CURRENT STATUS: For routine med check.   Individual Medical History/ Review of Systems: Changes? :No    Past medications for mental health diagnoses include: Sonata, Paxil, Celexa, Effexor, BuSpar made the anxiety worse, Ativan, Klonopin, Prozac, Xanax, Ambien, Lunesta, trazodone  Allergies: Erythromycin  Current Medications:  Current Outpatient Medications:    albuterol (VENTOLIN HFA) 108 (90 Base) MCG/ACT inhaler, Inhale 1 puff into the lungs every 6 (six) hours as needed for wheezing or shortness of breath., Disp: 18 g, Rfl: 11   ALPRAZolam (XANAX) 1 MG tablet, TAKE 1 TABLET BY MOUTH TWICE A DAY AS NEEDED FOR ANXIETY, Disp: 60 tablet, Rfl: 0   bisoprolol (ZEBETA) 5 MG tablet, Take 1 tablet (5 mg total) by mouth daily., Disp: 30 tablet, Rfl: 3   budesonide-formoterol (SYMBICORT) 160-4.5 MCG/ACT inhaler, Inhale 2 puffs into the lungs 2 (two) times daily., Disp: 10 g, Rfl: 5   fluticasone (FLONASE) 50 MCG/ACT nasal spray, Place 2 sprays into both nostrils daily., Disp: 16 g, Rfl: 6   gabapentin (NEURONTIN) 300 MG capsule, TAKE 1 CAPSULE BY MOUTH THREE TIMES A DAY, Disp: 90 capsule, Rfl: 1   montelukast (SINGULAIR) 10 MG tablet, Take 1 tablet (10 mg total) by mouth at bedtime., Disp: 30 tablet, Rfl: 11   pantoprazole (PROTONIX) 40 MG tablet, TAKE 1 TABLET BY MOUTH EVERY DAY, Disp: 90 tablet, Rfl: 1   sertraline (ZOLOFT) 100 MG tablet, TAKE 2.5 TABLETS BY MOUTH DAILY., Disp: 225 tablet, Rfl: 0 Medication Side Effects: none  Family Medical/ Social History: Changes?  Dtr in middle school, son in kindergarten this year. Niece Joanne Molina died at 1 yo from brain tumor a few months ago. Sold their home and moved in with her mom b/c her poor health,   MENTAL  HEALTH EXAM:  Last menstrual period 06/02/2019, unknown if currently breastfeeding.There is no height or weight on file to calculate BMI.  General Appearance: Casual, Neat and Well Groomed  Eye Contact:  Good  Speech:  Clear and Coherent and Normal Rate  Volume:  Normal  Mood:  Euthymic  Affect:  Appropriate   Thought Process:  Goal Directed and Descriptions of Associations: Circumstantial  Orientation:  Full (Time, Place, and Person)  Thought Content: Logical   Suicidal Thoughts:  No  Homicidal Thoughts:  No  Memory:  WNL  Judgement:  Good  Insight:  Good  Psychomotor Activity:  Normal  Concentration:  Concentration: Good  Recall:  Good  Fund of Knowledge: Good  Language: Good  Assets:  Desire for Improvement  ADL's:  Intact  Cognition: WNL  Prognosis:  Good   DIAGNOSES:  No diagnosis found.  Receiving Psychotherapy: No   RECOMMENDATIONS:  PDMP was reviewed.  Xanax last filled 08/24/2022. I provided 20 minutes of non-face-to-face time during this encounter, including time spent before and after the visit in records review, medical decision making, counseling pertinent to today's visit, and charting.   My sympathy in loss of her niece, Joanne Molina.   Continue gabapentin 300 mg, 1 p.o. 3 times daily. Continue Zoloft 100 mg, 2.5 pills daily. Continue Xanax 1 mg 1 p.o. twice daily as needed anxiety. Return in 6 months.  Joanne Overly, PA-C

## 2022-09-13 ENCOUNTER — Other Ambulatory Visit: Payer: Self-pay | Admitting: Adult Health

## 2022-09-14 ENCOUNTER — Other Ambulatory Visit: Payer: Self-pay | Admitting: Internal Medicine

## 2022-09-19 ENCOUNTER — Other Ambulatory Visit: Payer: Self-pay | Admitting: Adult Health

## 2022-09-21 ENCOUNTER — Encounter: Payer: Self-pay | Admitting: Primary Care

## 2022-09-21 ENCOUNTER — Ambulatory Visit: Payer: BC Managed Care – PPO | Admitting: Primary Care

## 2022-09-21 VITALS — BP 122/82 | HR 74 | Temp 98.2°F | Ht 66.0 in | Wt 184.6 lb

## 2022-09-21 DIAGNOSIS — J0110 Acute frontal sinusitis, unspecified: Secondary | ICD-10-CM

## 2022-09-21 DIAGNOSIS — J45991 Cough variant asthma: Secondary | ICD-10-CM | POA: Diagnosis not present

## 2022-09-21 DIAGNOSIS — I10 Essential (primary) hypertension: Secondary | ICD-10-CM

## 2022-09-21 DIAGNOSIS — Z8659 Personal history of other mental and behavioral disorders: Secondary | ICD-10-CM | POA: Diagnosis not present

## 2022-09-21 DIAGNOSIS — J310 Chronic rhinitis: Secondary | ICD-10-CM

## 2022-09-21 DIAGNOSIS — J329 Chronic sinusitis, unspecified: Secondary | ICD-10-CM

## 2022-09-21 MED ORDER — BISOPROLOL FUMARATE 5 MG PO TABS: 5.0000 mg | ORAL_TABLET | Freq: Every day | ORAL | 3 refills | Status: DC

## 2022-09-21 MED ORDER — BUDESONIDE-FORMOTEROL FUMARATE 160-4.5 MCG/ACT IN AERO: 2.0000 | INHALATION_SPRAY | Freq: Two times a day (BID) | RESPIRATORY_TRACT | 5 refills | Status: DC

## 2022-09-21 MED ORDER — BUDESONIDE-FORMOTEROL FUMARATE 80-4.5 MCG/ACT IN AERO: 2.0000 | INHALATION_SPRAY | Freq: Two times a day (BID) | RESPIRATORY_TRACT | 11 refills | Status: DC

## 2022-09-21 MED ORDER — MONTELUKAST SODIUM 10 MG PO TABS: 10.0000 mg | ORAL_TABLET | Freq: Every day | ORAL | 3 refills | Status: DC

## 2022-09-21 MED ORDER — AMOXICILLIN-POT CLAVULANATE 875-125 MG PO TABS: 1.0000 | ORAL_TABLET | Freq: Two times a day (BID) | ORAL | 0 refills | Status: DC

## 2022-09-21 MED ORDER — ALBUTEROL SULFATE HFA 108 (90 BASE) MCG/ACT IN AERS: 1.0000 | INHALATION_SPRAY | Freq: Four times a day (QID) | RESPIRATORY_TRACT | 11 refills | Status: DC | PRN

## 2022-09-21 NOTE — Patient Instructions (Addendum)
Recommendations Continue bisoprolol 5 mg at bedtime for anxiety (monitor heart rate notify if having recurrent bradycardia) Decrease Symbicort to 80 mcg 2 puffs morning and evening Continue Singulair 10 mg at bedtime Continue saline nasal rinses 1-2 times daily as needed for sinus congestion Augmentin to have on hand for acute sinusitis symptoms  Referral: ENT re: recurrent sinusitis Nutrition/dietician re: hx anorexia   Follow-up: 6 months with Dr. Melvyn Novas or Eustaquio Maize NP

## 2022-09-21 NOTE — Progress Notes (Signed)
@Patient  ID: , female    DOB: 03-24-1980, 42 y.o.   MRN: [id number]  Chief Complaint  Patient presents with   Follow-up    Referring provider: [id number], NP  HPI: 42 year old female, never smoked. PMH significant for HTN, cough variant asthma, chronic rhinitis, hysterectomy. Patient of Dr. 45, last seen by pulmonary NP on 09/19/21.  Previous [facility] pulmonary encounter: 09/19/2021 Follow up: Asthma  Patient returns for a follow-up visit.  Patient has underlying asthma.  She says she typically has a flare of her asthma every fall.  Over the last 2 years has been doing well up until this past 2 weeks.  Patient started having increased sinus congestion drainage.  She took a COVID test that was negative.  Symptoms continue to worsen despite using Mucinex Claritin and saline nasal rinses.  She was seen at a local urgent care and given a Z-Pak and a prednisone taper.  Patient said her symptoms continued without significant improvement.  She went to the emergency room on September 13, 2021.  COVID-19 test was negative.  Chest x-ray showed no acute process.  She was given a Depo-Medrol injection and started on a Medrol Dosepak.  Patient says yesterday she started having some improvement in symptoms.  She also changed from Symbicort 80 daily to Symbicort 160 twice daily.  She denies any hemoptysis chest pain orthopnea PND.  [name] a September 15, 2021.  She does not have any pets.  12/29/2021 Patient presents today for 3 month follow-up with PFTs. During last visit she was started on Singulair 10mg  at bedtime and changed from Claritin to Zyrtec. [name] maintained on Symbicort 12/31/2021 two puffs twice daily. She had RSV three weeks ago, she was evaluated by UC and she was treated with steroid dose pack. She has fully recovered. She feels higher dose Symbicort has made a big difference in her brething. She feels much improved since medications changes. Eos absolute 200 in March 2022. She did not  have lab work done but will today.   09/21/2022 - Interim hx  Patient presents today for follow-up/cough variant asthma.  [name] doing well. Asthma is not currently exacerbated but this time of year does tend to flare. [name] dealing with sinusitis symptoms. She has frontal sinus pressure for last week.  Mucus is thick. Last sinus infection was in July. She takes mucinex, claritin and singulair daily. She does not tolerating Flonase d.t heart palpitations. She will use saline nasal spray as needed. [name] concerned about being on steroids for her asthma d/t hx anorexia. Needs refills today.  Pulmonary function testing: 12/29/2021 FVC 4.47 (113%); FEV1 3.28 (102%), ratio 73, TLC 107%, DLCOunc 105% p using Symbicort 160 this morning. NO BD response.    Allergies  Allergen Reactions   Erythromycin Nausea Only    Immunization History  Administered Date(s) Administered   Influenza Split 09/09/2012   Tdap 05/04/2016    Past Medical History:  Diagnosis Date   Anxiety    panic attack   Cough variant asthma 05/14/2019   Depression    Gastropathy 10/05/2019   Migraine    S/P laparoscopic hysterectomy 06/29/2019    Tobacco History: Social History   Tobacco Use  Smoking Status Never   Passive exposure: Past  Smokeless Tobacco Never   Counseling given: Not Answered   Outpatient Medications Prior to Visit  Medication Sig Dispense Refill   ALPRAZolam (XANAX) 1 MG tablet TAKE 1 TABLET BY MOUTH TWICE A  DAY AS NEEDED FOR ANXIETY 60 tablet 5   fluticasone (FLONASE) 50 MCG/ACT nasal spray Place 2 sprays into both nostrils daily. 16 g 6   gabapentin (NEURONTIN) 300 MG capsule TAKE 1 CAPSULE BY MOUTH THREE TIMES A DAY 270 capsule 1   pantoprazole (PROTONIX) 40 MG tablet TAKE 1 TABLET BY MOUTH EVERY DAY 90 tablet 1   sertraline (ZOLOFT) 100 MG tablet TAKE 2.5 TABLETS BY MOUTH DAILY. 225 tablet 1   albuterol (VENTOLIN HFA) 108 (90 Base) MCG/ACT inhaler Inhale 1 puff into the lungs every 6 (six)  hours as needed for wheezing or shortness of breath. 18 g 11   bisoprolol (ZEBETA) 5 MG tablet Take 1 tablet (5 mg total) by mouth daily. 30 tablet 3   budesonide-formoterol (SYMBICORT) 160-4.5 MCG/ACT inhaler Inhale 2 puffs into the lungs 2 (two) times daily. 10 g 5   montelukast (SINGULAIR) 10 MG tablet Take 1 tablet (10 mg total) by mouth at bedtime. 30 tablet 11   No facility-administered medications prior to visit.    Review of Systems  Review of Systems  Constitutional: Negative.   HENT:  Positive for congestion, sinus pressure and sinus pain.   Respiratory:  Negative for cough.     Physical Exam  BP 122/82 (BP Location: Left Arm, Patient Position: Sitting, Cuff Size: Normal)   Pulse 74   Temp 98.2 F (36.8 C) (Oral)   Ht 5\' 6"  (1.676 m)   Wt 184 lb 9.6 oz (83.7 kg)   LMP 06/02/2019 (Approximate)   SpO2 97%   BMI 29.80 kg/m  Physical Exam Constitutional:      Appearance: Normal appearance.  HENT:     Head: Normocephalic and atraumatic.     Nose:     Right Sinus: Frontal sinus tenderness present.     Left Sinus: Frontal sinus tenderness present.  Cardiovascular:     Rate and Rhythm: Normal rate and regular rhythm.  Pulmonary:     Effort: Pulmonary effort is normal.     Breath sounds: Normal breath sounds. No wheezing, rhonchi or rales.  Musculoskeletal:        General: Normal range of motion.  Skin:    General: Skin is warm and dry.  Neurological:     General: No focal deficit present.     Mental Status: [name] alert and oriented to person, place, and time. Mental status is at baseline.  Psychiatric:        Mood and Affect: Mood normal.        Behavior: Behavior normal.        Thought Content: Thought content normal.        Judgment: Judgment normal.      Lab Results:  CBC    Component Value Date/Time   WBC 6.4 01/02/2022 0822   RBC 4.64 01/02/2022 0822   HGB 14.4 01/02/2022 0822   HGB 14.0 10/23/2017 0920   HCT 42.9 01/02/2022 0822   HCT 40.1  10/23/2017 0920   PLT 277.0 01/02/2022 0822   PLT 276 10/23/2017 0920   MCV 92.4 01/02/2022 0822   MCV 90 10/23/2017 0920   MCH 30.2 06/30/2019 0517   MCHC 33.6 01/02/2022 0822   RDW 12.5 01/02/2022 0822   RDW 13.2 10/23/2017 0920   LYMPHSABS 1.4 01/02/2022 0822   MONOABS 0.3 01/02/2022 0822   EOSABS 0.2 01/02/2022 0822   BASOSABS 0.0 01/02/2022 0822    BMET    Component Value Date/Time   NA 141 04/13/2022 1620  K 4.2 04/13/2022 1620   CL 104 04/13/2022 1620   CO2 23 04/13/2022 1620   GLUCOSE 87 04/13/2022 1620   GLUCOSE 111 (H) 06/25/2019 1454   BUN 17 04/13/2022 1620   CREATININE 1.04 (H) 04/13/2022 1620   CALCIUM 9.8 04/13/2022 1620   GFRNONAA >60 06/25/2019 1454   GFRAA >60 06/25/2019 1454    BNP No results found for: "BNP"  ProBNP No results found for: "PROBNP"  Imaging: No results found.   Assessment & Plan:   Cough variant asthma - Stable; No acute asthma symptoms. Recommend deescalating therapy to low dose ICS/LABA. Sending in RX Symbicort 65mcg 2 puffs twice daily and prn albuterol hfa 2 puffs q 6 hours   Acute sinusitis - Recurrent; Rx Augmentin 1 tab twice daily x 10 days  - Refer to ENT  Essential hypertension - Inderal changed to Bisprolol 5mg  by Dr. Melvyn Novas in May 2020 d/t asthma  - BP remains stable  - Refill provided    Hx of anorexia nervosa - Refer to dietitian   Chronic rhinitis - Continue Loratadine, Singulair and nasal saline rinses    Martyn Ehrich, NP 10/14/2022

## 2022-10-14 DIAGNOSIS — Z8659 Personal history of other mental and behavioral disorders: Secondary | ICD-10-CM | POA: Insufficient documentation

## 2022-10-14 DIAGNOSIS — J019 Acute sinusitis, unspecified: Secondary | ICD-10-CM | POA: Insufficient documentation

## 2022-10-14 NOTE — Assessment & Plan Note (Addendum)
-   Stable; No acute asthma symptoms. Recommend deescalating therapy to low dose ICS/LABA. Sending in RX Symbicort 49mcg 2 puffs twice daily and prn albuterol hfa 2 puffs q 6 hours

## 2022-10-14 NOTE — Assessment & Plan Note (Addendum)
-   Recurrent; Rx Augmentin 1 tab twice daily x 10 days  - Refer to ENT

## 2022-10-14 NOTE — Assessment & Plan Note (Addendum)
-   Continue Loratadine, Singulair and nasal saline rinses

## 2022-10-14 NOTE — Assessment & Plan Note (Signed)
Refer to dietitian

## 2022-10-14 NOTE — Assessment & Plan Note (Signed)
-   Inderal changed to Bisprolol 5mg  by Dr. Melvyn Novas in May 2020 d/t asthma  - BP remains stable  - Refill provided

## 2022-10-19 ENCOUNTER — Telehealth: Payer: BC Managed Care – PPO | Admitting: Family Medicine

## 2022-10-19 DIAGNOSIS — J4540 Moderate persistent asthma, uncomplicated: Secondary | ICD-10-CM | POA: Diagnosis not present

## 2022-10-19 MED ORDER — PREDNISONE 20 MG PO TABS: 20.0000 mg | ORAL_TABLET | Freq: Two times a day (BID) | ORAL | 0 refills | Status: AC

## 2022-10-19 NOTE — Progress Notes (Signed)
Visit for Asthma  Based on what you have shared with me, it looks like you may have a flare up of your asthma.  Asthma is a chronic (ongoing) lung disease which results in airway obstruction, inflammation and hyper-responsiveness.   Asthma symptoms vary from person to person, with common symptoms including nighttime awakening and decreased ability to participate in normal activities as a result of shortness of breath. It is often triggered by changes in weather, changes in the season, changes in air temperature, or inside (home, school, daycare or work) allergens such as animal dander, mold, mildew, woodstoves or cockroaches.   It can also be triggered by hormonal changes, extreme emotion, physical exertion or an upper respiratory tract illness.     It is important to identify the trigger, and then eliminate or avoid the trigger if possible.   If you have been prescribed medications to be taken on a regular basis, it is important to follow the asthma action plan and to follow guidelines to adjust medication in response to increasing symptoms of decreased peak expiratory flow rate  Treatment: I have prescribed: prednisone  HOME CARE Only take medications as instructed by your medical team. Consider wearing a mask or scarf to improve breathing air temperature have been shown to decrease irritation and decrease exacerbations Get rest. Taking a steamy shower or using a humidifier may help nasal congestion sand ease sore throat pain. You can place a towel over your head and breathe in the steam from hot water coming from a faucet. Using a saline nasal spray works much the same way.  Cough drops, hare candies and sore throat lozenges may ease your cough.  Avoid close contacts especially the very you and the elderly Cover your mouth if you cough or sneeze Always remember to wash your hands.     GET HELP RIGHT AWAY IF: You develop worsening symptoms; breathlessness at rest, drowsy, confused or agitated, unable to speak in full sentences You have coughing fits You develop a severe headache or visual changes You develop shortness of breath, difficulty breathing or start having chest pain Your symptoms persist after you have completed your treatment plan If your symptoms do not improve within 10 days  MAKE SURE YOU Understand these instructions. Will watch your condition. Will get help right away if you are not doing well or get worse.   Your e-visit answers were reviewed by a board certified advanced clinical practitioner to complete your personal care plan, Depending upon the condition, your plan could have included both over the counter or prescription medications.   Please review your pharmacy choice. Your safety is important to [place]. If you have drug allergies check your prescription carefully.  You can use MyChart to ask questions about today's visit, request a non-urgent  call back, or ask for a work or school excuse for 24 hours related to this e-Visit. If it has been greater than 24 hours you will need to follow up with your provider, or enter a new e-Visit to address those concerns.   You will get an e-mail in the next two days asking about your experience. I hope that your e-visit has been valuable and will speed your recovery. Thank you for using e-visits.   I have provided 5 minutes of non face to face time during this encounter for chart review and documentation.

## 2022-10-22 ENCOUNTER — Encounter: Payer: Self-pay | Admitting: *Deleted

## 2023-02-01 ENCOUNTER — Ambulatory Visit: Payer: BC Managed Care – PPO | Admitting: Adult Health

## 2023-02-01 VITALS — BP 110/80 | HR 70 | Temp 98.0°F | Ht 66.0 in | Wt 178.0 lb

## 2023-02-01 DIAGNOSIS — M542 Cervicalgia: Secondary | ICD-10-CM
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
## 2023-02-24 ENCOUNTER — Ambulatory Visit
Admission: RE | Admit: 2023-02-24 | Discharge: 2023-02-24 | Disposition: A | Discharge status: Home / Self Care | Payer: BC Managed Care – PPO | Source: Ambulatory Visit | Attending: Adult Health | Admitting: Adult Health

## 2023-02-24 DIAGNOSIS — M542 Cervicalgia: Secondary | ICD-10-CM

## 2023-03-01 ENCOUNTER — Encounter: Payer: Self-pay | Admitting: Physician Assistant

## 2023-03-01 ENCOUNTER — Telehealth (INDEPENDENT_AMBULATORY_CARE_PROVIDER_SITE_OTHER): Payer: BC Managed Care – PPO | Admitting: Physician Assistant

## 2023-03-01 DIAGNOSIS — F3341 Major depressive disorder, recurrent, in partial remission: Secondary | ICD-10-CM

## 2023-03-01 DIAGNOSIS — G4700 Insomnia, unspecified: Secondary | ICD-10-CM

## 2023-03-01 DIAGNOSIS — F410 Panic disorder [episodic paroxysmal anxiety] without agoraphobia: Secondary | ICD-10-CM

## 2023-03-01 DIAGNOSIS — F411 Generalized anxiety disorder: Secondary | ICD-10-CM

## 2023-03-01 MED ORDER — GABAPENTIN 300 MG PO CAPS: ORAL_CAPSULE | ORAL | 1 refills | Status: DC

## 2023-03-01 MED ORDER — ALPRAZOLAM 1 MG PO TABS: ORAL_TABLET | ORAL | 5 refills | Status: DC

## 2023-03-01 MED ORDER — SERTRALINE HCL 100 MG PO TABS: ORAL_TABLET | ORAL | 1 refills | Status: DC

## 2023-03-01 NOTE — Progress Notes (Addendum)
[facility]  Patient ID: Joanne Molina,  MRN: [id number]  PCP: Dorothyann Peng, NP  Date of Evaluation: 03/01/2023 Time spent:20 minutes  Chief Complaint:  Chief Complaint   Anxiety; Depression; Insomnia; Follow-up    Virtual Visit via Telehealth  I connected with patient by a video enabled telemedicine application with their informed consent, and verified patient privacy and that I am speaking with the correct person using two identifiers.  I am private, in my office and the patient is at home.  I discussed the limitations, risks, security and privacy concerns of performing an evaluation and management service by video and the availability of in person appointments. I also discussed with the patient that there may be a patient responsible charge related to this service. The patient expressed understanding and agreed to proceed.   I discussed the assessment and treatment plan with the patient. The patient was provided an opportunity to ask questions and all were answered. The patient agreed with the plan and demonstrated an understanding of the instructions.   The patient was advised to call back or seek an in-person evaluation if the symptoms worsen or if the condition fails to improve as anticipated.  I provided 20 minutes of non-face-to-face time during this encounter.  HISTORY/CURRENT STATUS: For routine 6 month med check.   Doing well under the circumstances. Sister isn't doing well mentally, her Mom has become psychotic and is currently being tx for that, and she still grieves the loss of her 88 yo niece who died last summer from [place]. 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 Work is going well.   Feels that her meds are working well. She had a bad PA not long ago, heart racing, got SOB. Took a xanax and used tools she knows to help calm her and it went away in a little while. Still gets overwhelmed easily but not nearly as many  PA as in the past.   Patient is able to enjoy things.  Energy and motivation are good.   No extreme sadness, tearfulness, or feelings of hopelessness.  Sleeps well most of the time. ADLs and personal hygiene are normal.   Denies any changes in concentration, making decisions, or remembering things.  Appetite has not changed.  Weight is stable.  Denies suicidal or homicidal thoughts.  Denies dizziness, syncope, seizures, numbness, tingling, tremor, tics, unsteady gait, slurred speech, confusion. Denies muscle or joint pain, stiffness, or dystonia.  Individual Medical History/ Review of Systems: Changes? :Yes   bronchitis, asthma exacerbation. Just finished ATBX and Pred   Past medications for mental health diagnoses include: Sonata, Paxil, Celexa, Effexor, BuSpar made the anxiety worse, Ativan, Klonopin, Prozac, Xanax, Ambien, Lunesta, trazodone, Propranolol for anxiety changed to bisoprolol b/c her hx of asthma  Allergies: Erythromycin  Current Medications:  Current Outpatient Medications:    albuterol (VENTOLIN HFA) 108 (90 Base) MCG/ACT inhaler, Inhale 1 puff into the lungs every 6 (six) hours as needed for wheezing or shortness of breath., Disp: 18 g, Rfl: 11   bisoprolol (ZEBETA) 5 MG tablet, Take 1 tablet (5 mg total) by mouth daily., Disp: 90 tablet, Rfl: 3   budesonide-formoterol (SYMBICORT) 80-4.5 MCG/ACT inhaler, Inhale 2 puffs into the lungs 2 (two) times daily., Disp: 1 each, Rfl: 11   famotidine (PEPCID) 40 MG tablet, Take 40 mg by mouth daily., Disp: , Rfl:    fluticasone (FLONASE) 50 MCG/ACT nasal spray, Place 2 sprays into both nostrils daily., Disp: 16  g, Rfl: 6   montelukast (SINGULAIR) 10 MG tablet, Take 1 tablet (10 mg total) by mouth at bedtime., Disp: 90 tablet, Rfl: 3   tretinoin (RETIN-A) 0.025 % cream, Apply topically., Disp: , Rfl:    ALPRAZolam (XANAX) 1 MG tablet, TAKE 1 TABLET BY MOUTH TWICE A DAY AS NEEDED FOR ANXIETY, Disp: 60 tablet, Rfl: 5   gabapentin  (NEURONTIN) 300 MG capsule, TAKE 1 CAPSULE BY MOUTH THREE TIMES A DAY, Disp: 270 capsule, Rfl: 1   pantoprazole (PROTONIX) 40 MG tablet, TAKE 1 TABLET BY MOUTH EVERY DAY (Patient not taking: Reported on 03/01/2023), Disp: 90 tablet, Rfl: 1   sertraline (ZOLOFT) 100 MG tablet, TAKE 2.5 TABLETS BY MOUTH DAILY., Disp: 225 tablet, Rfl: 1 Medication Side Effects: none  Family Medical/ Social History: Changes?  Got the Head of Dept position [employer] Mom's health is worse, psychotic. Her family lives with her Mom.  MENTAL HEALTH EXAM:  Last menstrual period 06/02/2019, unknown if currently breastfeeding.There is no height or weight on file to calculate BMI.  General Appearance: Casual, Neat and Well Groomed  Eye Contact:  Good  Speech:  Clear and Coherent and Normal Rate  Volume:  Normal  Mood:  Euthymic  Affect:  Congruent   Thought Process:  Goal Directed and Descriptions of Associations: Circumstantial  Orientation:  Full (Time, Place, and Person)  Thought Content: Logical   Suicidal Thoughts:  No  Homicidal Thoughts:  No  Memory:  WNL  Judgement:  Good  Insight:  Good  Psychomotor Activity:  Normal  Concentration:  Concentration: Good and Attention Span: Good  Recall:  Good  Fund of Knowledge: Good  Language: Good  Assets:  Desire for Improvement  ADL's:  Intact  Cognition: WNL  Prognosis:  Good   DIAGNOSES:    ICD-10-CM   1. Recurrent major depressive disorder, in partial remission ([place])  F33.41     2. Generalized anxiety disorder  F41.1     3. Panic disorder  F41.0     4. Insomnia, unspecified type  G47.00       Receiving Psychotherapy: No   RECOMMENDATIONS:  PDMP was reviewed.  Xanax last filled 02/25/2023. I provided 20 minutes of non-face-to-face time during this encounter, including time spent before and after the visit in records review, medical decision making, counseling pertinent to today's visit, and charting.   She's doing well as far as our meds go. No  changes will be made.   Continue Xanax 1 mg, 1 p.o. twice daily as needed anxiety. Continue gabapentin 300 mg, 1 p.o. 3 times daily. Continue Zoloft 100 mg, 2.5 pills daily. Recommend counseling if needed. Return in 6 months.  Donnal Moat, PA-C

## 2023-03-08 ENCOUNTER — Encounter: Payer: Self-pay | Admitting: Adult Health

## 2023-03-08 ENCOUNTER — Ambulatory Visit (INDEPENDENT_AMBULATORY_CARE_PROVIDER_SITE_OTHER): Payer: BC Managed Care – PPO | Admitting: Adult Health

## 2023-03-08 VITALS — BP 102/62 | HR 67 | Temp 98.0°F | Ht 65.75 in | Wt 179.0 lb

## 2023-03-08 DIAGNOSIS — I10 Essential (primary) hypertension: Secondary | ICD-10-CM | POA: Diagnosis not present

## 2023-03-08 DIAGNOSIS — M25512 Pain in left shoulder: Secondary | ICD-10-CM

## 2023-03-08 DIAGNOSIS — Z0000 Encounter for general adult medical examination without abnormal findings: Secondary | ICD-10-CM | POA: Diagnosis not present

## 2023-03-08 DIAGNOSIS — J4540 Moderate persistent asthma, uncomplicated: Secondary | ICD-10-CM | POA: Diagnosis not present

## 2023-03-08 DIAGNOSIS — F3341 Major depressive disorder, recurrent, in partial remission: Secondary | ICD-10-CM | POA: Diagnosis not present

## 2023-03-08 DIAGNOSIS — G4700 Insomnia, unspecified: Secondary | ICD-10-CM

## 2023-03-08 DIAGNOSIS — F411 Generalized anxiety disorder: Secondary | ICD-10-CM

## 2023-03-08 DIAGNOSIS — M542 Cervicalgia: Secondary | ICD-10-CM

## 2023-03-08 DIAGNOSIS — G8929 Other chronic pain: Secondary | ICD-10-CM

## 2023-03-08 LAB — COMPREHENSIVE METABOLIC PANEL
ALT: 22 U/L (ref 0–35)
AST: 16 U/L (ref 0–37)
Albumin: 4.1 g/dL (ref 3.5–5.2)
Alkaline Phosphatase: 47 U/L (ref 39–117)
BUN: 11 mg/dL (ref 6–23)
CO2: 29 mEq/L (ref 19–32)
Calcium: 9.5 mg/dL (ref 8.4–10.5)
Chloride: 104 mEq/L (ref 96–112)
Creatinine, Ser: 1.1 mg/dL (ref 0.40–1.20)
GFR: 61.74 mL/min (ref >60.00)
Glucose, Bld: 91 mg/dL (ref 70–99)
Potassium: 4.2 mEq/L (ref 3.5–5.1)
Sodium: 138 mEq/L (ref 135–145)
Total Bilirubin: 0.5 mg/dL (ref 0.2–1.2)
Total Protein: 6.9 g/dL (ref 6.0–8.3)

## 2023-03-08 LAB — CBC WITH DIFFERENTIAL/PLATELET
Basophils Absolute: 0.1 10*3/uL (ref 0.0–0.1)
Basophils Relative: 0.6 % (ref 0.0–3.0)
Eosinophils Absolute: 0.3 10*3/uL (ref 0.0–0.7)
Eosinophils Relative: 2.6 % (ref 0.0–5.0)
HCT: 42.4 % (ref 36.0–46.0)
Hemoglobin: 14.5 g/dL (ref 12.0–15.0)
Lymphocytes Relative: 19.3 % (ref 12.0–46.0)
Lymphs Abs: 2.1 10*3/uL (ref 0.7–4.0)
MCHC: 34.3 g/dL (ref 30.0–36.0)
MCV: 93.7 fl (ref 78.0–100.0)
Monocytes Absolute: 0.5 10*3/uL (ref 0.1–1.0)
Monocytes Relative: 5 % (ref 3.0–12.0)
Neutro Abs: 7.7 10*3/uL (ref 1.4–7.7)
Neutrophils Relative %: 72.5 % (ref 43.0–77.0)
Platelets: 300 10*3/uL (ref 150.0–400.0)
RBC: 4.52 Mil/uL (ref 3.87–5.11)
RDW: 12.7 % (ref 11.5–15.5)
WBC: 10.6 10*3/uL — ABNORMAL HIGH (ref 4.0–10.5)

## 2023-03-08 LAB — LIPID PANEL
Cholesterol: 219 mg/dL — ABNORMAL HIGH (ref 0–200)
HDL: 56.7 mg/dL (ref >39.00)
LDL Cholesterol: 142 mg/dL — ABNORMAL HIGH (ref 0–99)
NonHDL: 162.6
Total CHOL/HDL Ratio: 4
Triglycerides: 105 mg/dL (ref 0.0–149.0)
VLDL: 21 mg/dL (ref 0.0–40.0)

## 2023-03-08 LAB — TSH: TSH: 1.62 u[IU]/mL (ref 0.35–5.50)
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
## 2023-03-08 NOTE — Patient Instructions (Addendum)
Talk to psychiatry about possibly switching you to Cymbalta. This can help with anxiety/depression as well as chronic pain

## 2023-03-20 ENCOUNTER — Ambulatory Visit (INDEPENDENT_AMBULATORY_CARE_PROVIDER_SITE_OTHER): Payer: BC Managed Care – PPO | Admitting: Orthopaedic Surgery

## 2023-03-20 DIAGNOSIS — M25512 Pain in left shoulder: Secondary | ICD-10-CM | POA: Diagnosis not present

## 2023-03-20 DIAGNOSIS — G8929 Other chronic pain: Secondary | ICD-10-CM
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
## 2023-03-22 ENCOUNTER — Other Ambulatory Visit: Payer: Self-pay | Admitting: *Deleted

## 2023-03-22 ENCOUNTER — Ambulatory Visit: Payer: BC Managed Care – PPO | Admitting: Primary Care

## 2023-03-25 ENCOUNTER — Ambulatory Visit: Payer: BC Managed Care – PPO | Admitting: Primary Care

## 2023-04-04 ENCOUNTER — Other Ambulatory Visit: Payer: Self-pay

## 2023-04-04 ENCOUNTER — Ambulatory Visit: Payer: BC Managed Care – PPO | Admitting: Physical Medicine and Rehabilitation

## 2023-04-04 VITALS — BP 122/85 | HR 71

## 2023-04-04 DIAGNOSIS — M5412 Radiculopathy, cervical region: Secondary | ICD-10-CM | POA: Diagnosis not present

## 2023-04-04 MED ORDER — METHYLPREDNISOLONE ACETATE 80 MG/ML IJ SUSP
80.0000 mg | Freq: Once | INTRAMUSCULAR | Status: AC
  Administered 2023-04-04: 80 mg

## 2023-04-04 NOTE — Patient Instructions (Signed)

## 2023-04-04 NOTE — Progress Notes (Signed)
Functional Pain Scale - descriptive words and definitions  Distracting (5)    Aware of pain/able to complete some ADL's but limited by pain/sleep is affected and active distractions are only slightly useful. Moderate range order  Average Pain 5   +Driver, -BT, -Dye Allergies.  Neck pain on the left side that radiates in the left shoulder. Numbness in left shoulder

## 2023-04-15 NOTE — Procedures (Signed)
Cervical Epidural Steroid Injection - Interlaminar Approach with Fluoroscopic Guidance  Patient: Joanne Molina      Date of Birth: [birth year] MRN: [id number] PCP: Shirline Frees, NP      Visit Date: 04/04/2023   Universal Protocol:    Date/Time: 04/15/248:26 PM  Consent Given By: the patient  Position: PRONE  Additional Comments: Vital signs were monitored before and after the procedure. Patient was prepped and draped in the usual sterile fashion. The correct patient, procedure, and site was verified.   Injection Procedure Details:   Procedure diagnoses: Cervical radiculopathy [M54.12]    Meds Administered:  Meds ordered this encounter  Medications   methylPREDNISolone acetate (DEPO-MEDROL) injection 80 mg     Laterality: Left  Location/Site: C7-T1  Needle: 3.5 in., 20 ga. Tuohy  Needle Placement: Paramedian epidural space  Findings:  -Comments: Excellent flow of contrast into the epidural space.  Procedure Details: Using a paramedian approach from the side mentioned above, the region overlying the inferior lamina was localized under fluoroscopic visualization and the soft tissues overlying this structure were infiltrated with 4 ml. of 1% Lidocaine without Epinephrine. A # 20 gauge, Tuohy needle was inserted into the epidural space using a paramedian approach.  The epidural space was localized using loss of resistance along with contralateral oblique bi-planar fluoroscopic views.  After negative aspirate for air, blood, and CSF, a 2 ml. volume of Isovue-250 was injected into the epidural space and the flow of contrast was observed. Radiographs were obtained for documentation purposes.   The injectate was administered into the level noted above.  Additional Comments:  No complications occurred Dressing: 2 x 2 sterile gauze and Band-Aid    Post-procedure details: Patient was observed during the procedure. Post-procedure instructions were reviewed.  Patient left  the clinic in stable condition.

## 2023-04-15 NOTE — Progress Notes (Signed)
Joanne Molina - 43 y.o. female MRN [id number]  Date of birth: Feb 03, 1980  Office Visit Note: Visit Date: 04/04/2023 PCP: Shirline Frees, NP Referred by: Shirline Frees, NP  Subjective: Chief Complaint  Patient presents with   Neck - Pain   HPI:  Joanne Molina is a 43 y.o. female who comes in today at the request of Dr. Glee Arvin for planned Left C7-T1 Cervical Interlaminar epidural steroid injection with fluoroscopic guidance.  The patient has failed conservative care including home exercise, medications, time and activity modification.  This injection will be diagnostic and hopefully therapeutic.  Please see requesting physician notes for further details and justification.   ROS Otherwise per HPI.  Assessment & Plan: Visit Diagnoses:    ICD-10-CM   1. Cervical radiculopathy  M54.12 XR C-ARM NO REPORT    Epidural Steroid injection    methylPREDNISolone acetate (DEPO-MEDROL) injection 80 mg      Plan: No additional findings.   Meds & Orders:  Meds ordered this encounter  Medications   methylPREDNISolone acetate (DEPO-MEDROL) injection 80 mg    Orders Placed This Encounter  Procedures   XR C-ARM NO REPORT   Epidural Steroid injection    Follow-up: Return for visit to requesting provider as needed.   Procedures: No procedures performed  Cervical Epidural Steroid Injection - Interlaminar Approach with Fluoroscopic Guidance  Patient: Joanne Molina      Date of Birth: [birth year] MRN: [id number] PCP: Shirline Frees, NP      Visit Date: 04/04/2023   Universal Protocol:    Date/Time: 04/15/248:26 PM  Consent Given By: the patient  Position: PRONE  Additional Comments: Vital signs were monitored before and after the procedure. Patient was prepped and draped in the usual sterile fashion. The correct patient, procedure, and site was verified.   Injection Procedure Details:   Procedure diagnoses: Cervical radiculopathy [M54.12]    Meds Administered:  Meds  ordered this encounter  Medications   methylPREDNISolone acetate (DEPO-MEDROL) injection 80 mg     Laterality: Left  Location/Site: C7-T1  Needle: 3.5 in., 20 ga. Tuohy  Needle Placement: Paramedian epidural space  Findings:  -Comments: Excellent flow of contrast into the epidural space.  Procedure Details: Using a paramedian approach from the side mentioned above, the region overlying the inferior lamina was localized under fluoroscopic visualization and the soft tissues overlying this structure were infiltrated with 4 ml. of 1% Lidocaine without Epinephrine. A # 20 gauge, Tuohy needle was inserted into the epidural space using a paramedian approach.  The epidural space was localized using loss of resistance along with contralateral oblique bi-planar fluoroscopic views.  After negative aspirate for air, blood, and CSF, a 2 ml. volume of Isovue-250 was injected into the epidural space and the flow of contrast was observed. Radiographs were obtained for documentation purposes.   The injectate was administered into the level noted above.  Additional Comments:  No complications occurred Dressing: 2 x 2 sterile gauze and Band-Aid    Post-procedure details: Patient was observed during the procedure. Post-procedure instructions were reviewed.  Patient left the clinic in stable condition.   Clinical History: CLINICAL DATA:  Neck pain, chronic.   EXAM: MRI CERVICAL SPINE WITHOUT CONTRAST   TECHNIQUE: Multiplanar, multisequence MR imaging of the cervical spine was performed. No intravenous contrast was administered.   COMPARISON:  MRI of the cervical spine August 12, 2020.   FINDINGS: Alignment: Physiologic.   Vertebrae: No fracture, evidence of discitis, or bone lesion. Postsurgical changes  from C5-6 ACDF.   Cord: Normal signal and morphology.   Posterior Fossa, vertebral arteries, paraspinal tissues: Mucosal thickening throughout the paranasal sinuses with fluid level  within the left maxillary sinus.   Disc levels:   C2-3: No spinal canal or neural foraminal stenosis.   C3-4: Minimal disc bulge without spinal canal stenosis. Mild facet degenerative changes bilaterally and mild right uncoarthrosis resulting in minimal right neural foraminal narrowing.   C4-5: Minimal bulging of the disc without significant spinal canal stenosis. Facet degenerative changes bilaterally and right uncoarthrosis resulting in moderate right neural foraminal narrowing which appear progressed when compared to prior MRI.   C5-6: Status post fusion. No spinal canal stenosis. Mild bilateral neural foraminal narrowing, improved when compared to prior MRI.   C6-7: Minimal bulging of the disc without spinal canal or neural foraminal stenosis.   C7-T1: Left-sided facet degenerative changes. No spinal canal or neural foraminal stenosis.   IMPRESSION: 1. Interval fusion at C5-6 with improved patency of the spinal canal and neural foramen. 2. Moderate right neural foraminal narrowing at C4-5, mildly progressed when compared to prior MRI. 3. No high-grade spinal canal stenosis at any level. 4. Diffuse paranasal sinus disease with fluid level within the left maxillary sinus, suggesting acute sinusitis.     Electronically Signed   By: Baldemar Lenis M.D.   On: 02/26/2023 12:44     Objective:  VS:  HT:    WT:   BMI:     BP:122/85  HR:71bpm  TEMP: ( )  RESP:  Physical Exam Vitals and nursing note reviewed.  Constitutional:      General: [name] not in acute distress.    Appearance: Normal appearance. [name] not ill-appearing.  HENT:     Head: Normocephalic and atraumatic.     Right Ear: External ear normal.     Left Ear: External ear normal.  Eyes:     Extraocular Movements: Extraocular movements intact.  Cardiovascular:     Rate and Rhythm: Normal rate.     Pulses: Normal pulses.  Musculoskeletal:     Cervical back: Tenderness present. No  rigidity.     Right lower leg: No edema.     Left lower leg: No edema.     Comments: Patient has good strength in the upper extremities including 5 out of 5 strength in wrist extension long finger flexion and APB.  There is no atrophy of the hands intrinsically.  There is a negative Hoffmann's test.   Lymphadenopathy:     Cervical: No cervical adenopathy.  Skin:    Findings: No erythema, lesion or rash.  Neurological:     General: No focal deficit present.     Mental Status: [name] alert and oriented to person, place, and time.     Sensory: No sensory deficit.     Motor: No weakness or abnormal muscle tone.     Coordination: Coordination normal.  Psychiatric:        Mood and Affect: Mood normal.        Behavior: Behavior normal.      Imaging: No results found.

## 2023-04-19 ENCOUNTER — Other Ambulatory Visit (HOSPITAL_BASED_OUTPATIENT_CLINIC_OR_DEPARTMENT_OTHER): Payer: Self-pay

## 2023-04-19 ENCOUNTER — Ambulatory Visit (HOSPITAL_BASED_OUTPATIENT_CLINIC_OR_DEPARTMENT_OTHER)
Admission: RE | Admit: 2023-04-19 | Discharge: 2023-04-19 | Disposition: A | Discharge status: Home / Self Care | Payer: BC Managed Care – PPO | Source: Ambulatory Visit | Attending: Adult Health | Admitting: Adult Health

## 2023-04-19 DIAGNOSIS — Z1231 Encounter for screening mammogram for malignant neoplasm of breast: Secondary | ICD-10-CM | POA: Diagnosis present

## 2023-04-24 ENCOUNTER — Other Ambulatory Visit: Payer: Self-pay | Admitting: Adult Health

## 2023-04-24 DIAGNOSIS — R928 Other abnormal and inconclusive findings on diagnostic imaging of breast: Secondary | ICD-10-CM

## 2023-04-29 NOTE — Progress Notes (Unsigned)
@Patient  ID: Joanne Molina, female    DOB: Aug 05, 1980, 43 y.o.   MRN: [id number]  No chief complaint on file.   Referring provider: Shirline Frees, NP  HPI:  43 year old female, never smoked. PMH significant for HTN, cough variant asthma, chronic rhinitis, hysterectomy. Patient of Dr. Sherene Sires, last seen by pulmonary NP on 09/19/21.  Previous [facility] pulmonary encounter: 09/19/2021 Follow up: Asthma  Patient returns for a follow-up visit.  Patient has underlying asthma.  She says she typically has a flare of her asthma every fall.  Over the last 2 years has been doing well up until this past 2 weeks.  Patient started having increased sinus congestion drainage.  She took a COVID test that was negative.  Symptoms continue to worsen despite using Mucinex Claritin and saline nasal rinses.  She was seen at a local urgent care and given a Z-Pak and a prednisone taper.  Patient said her symptoms continued without significant improvement.  She went to the emergency room on September 13, 2021.  COVID-19 test was negative.  Chest x-ray showed no acute process.  She was given a Depo-Medrol injection and started on a Medrol Dosepak.  Patient says yesterday she started having some improvement in symptoms.  She also changed from Symbicort 80 daily to Symbicort 160 twice daily.  She denies any hemoptysis chest pain orthopnea PND.  [name] a Radio producer.  She does not have any pets.  12/29/2021 Patient presents today for 3 month follow-up with PFTs. During last visit she was started on Singulair 10mg  at bedtime and changed from Claritin to Zyrtec. [name] maintained on Symbicort two puffs twice daily. She had RSV three weeks ago, she was evaluated by UC and she was treated with steroid dose pack. She has fully recovered. She feels higher dose Symbicort has made a big difference in her brething. She feels much improved since medications changes. Eos absolute 200 in March 2022. She did not have lab work done but  will today.   09/21/2022  Patient presents today for follow-up/cough variant asthma.  [name] doing well. Asthma is not currently exacerbated but this time of year does tend to flare. [name] dealing with sinusitis symptoms. She has frontal sinus pressure for last week.  Mucus is thick. Last sinus infection was in July. She takes mucinex, claritin and singulair daily. She does not tolerating Flonase d.t heart palpitations. She will use saline nasal spray as needed. [name] concerned about being on steroids for her asthma d/t hx anorexia. Needs refills today.  04/30/2023 - Interim hx  Patient presents today for 6 month follow-up. PMH significant for cough variant asthma and recurrent sinusitis. Maintained Symbicort , Bioprolol, Singulair and saline nasal rinses. Referred to ENT during our last visit.       Pulmonary function testing: 12/29/2021 FVC 4.47 (113%); FEV1 3.28 (102%), ratio 73, TLC 107%, DLCOunc 105% p using Symbicort 160 this morning. NO BD response.            Allergies  Allergen Reactions   Erythromycin Nausea Only    Immunization History  Administered Date(s) Administered   Influenza Split 09/09/2012   Tdap 05/04/2016    Past Medical History:  Diagnosis Date   Anxiety    panic attack   Cough variant asthma 05/14/2019   Depression    Gastropathy 10/05/2019   Migraine    S/P laparoscopic hysterectomy 06/29/2019    Tobacco History: Social History   Tobacco Use  Smoking Status Never   Passive exposure: Past  Smokeless Tobacco Never   Counseling given: Not Answered   Outpatient Medications Prior to Visit  Medication Sig Dispense Refill   albuterol (VENTOLIN HFA) 108 (90 Base) MCG/ACT inhaler Inhale 1 puff into the lungs every 6 (six) hours as needed for wheezing or shortness of breath. 18 g 11   ALPRAZolam (XANAX) 1 MG tablet TAKE 1 TABLET BY MOUTH TWICE A DAY AS NEEDED FOR ANXIETY 60 tablet 5   bisoprolol (ZEBETA) 5 MG tablet Take 1 tablet (5 mg  total) by mouth daily. 90 tablet 3   budesonide-formoterol (SYMBICORT) 80-4.5 MCG/ACT inhaler Inhale 2 puffs into the lungs 2 (two) times daily. 1 each 11   famotidine (PEPCID) 40 MG tablet Take 40 mg by mouth daily.     fluticasone (FLONASE) 50 MCG/ACT nasal spray Place 2 sprays into both nostrils daily. 16 g 6   gabapentin (NEURONTIN) 300 MG capsule TAKE 1 CAPSULE BY MOUTH THREE TIMES A DAY 270 capsule 1   montelukast (SINGULAIR) 10 MG tablet Take 1 tablet (10 mg total) by mouth at bedtime. 90 tablet 3   pantoprazole (PROTONIX) 40 MG tablet TAKE 1 TABLET BY MOUTH EVERY DAY 90 tablet 1   sertraline (ZOLOFT) 100 MG tablet TAKE 2.5 TABLETS BY MOUTH DAILY. 225 tablet 1   tretinoin (RETIN-A) 0.025 % cream Apply topically.     No facility-administered medications prior to visit.      Review of Systems  Review of Systems   Physical Exam  LMP 06/02/2019 (Approximate)  Physical Exam   Lab Results:  CBC    Component Value Date/Time   WBC 10.6 (H) 03/08/2023 1133   RBC 4.52 03/08/2023 1133   HGB 14.5 03/08/2023 1133   HGB 14.0 10/23/2017 0920   HCT 42.4 03/08/2023 1133   HCT 40.1 10/23/2017 0920   PLT 300.0 03/08/2023 1133   PLT 276 10/23/2017 0920   MCV 93.7 03/08/2023 1133   MCV 90 10/23/2017 0920   MCH 30.2 06/30/2019 0517   MCHC 34.3 03/08/2023 1133   RDW 12.7 03/08/2023 1133   RDW 13.2 10/23/2017 0920   LYMPHSABS 2.1 03/08/2023 1133   MONOABS 0.5 03/08/2023 1133   EOSABS 0.3 03/08/2023 1133   BASOSABS 0.1 03/08/2023 1133    BMET    Component Value Date/Time   NA 138 03/08/2023 1133   NA 141 04/13/2022 1620   K 4.2 03/08/2023 1133   CL 104 03/08/2023 1133   CO2 29 03/08/2023 1133   GLUCOSE 91 03/08/2023 1133   BUN 11 03/08/2023 1133   BUN 17 04/13/2022 1620   CREATININE 1.10 03/08/2023 1133   CALCIUM 9.5 03/08/2023 1133   GFRNONAA >60 06/25/2019 1454   GFRAA >60 06/25/2019 1454    BNP No results found for: "BNP"  ProBNP No results found for:  "PROBNP"  Imaging: MM 3D SCREENING MAMMOGRAM BILATERAL BREAST  Result Date: 04/23/2023 CLINICAL DATA:  Screening. EXAM: DIGITAL SCREENING BILATERAL MAMMOGRAM WITH TOMOSYNTHESIS AND CAD TECHNIQUE: Bilateral screening digital craniocaudal and mediolateral oblique mammograms were obtained. Bilateral screening digital breast tomosynthesis was performed. The images were evaluated with computer-aided detection. COMPARISON:  None available. ACR Breast Density Category c: The breasts are heterogeneously dense, which may obscure small masses. FINDINGS: In the left breast, a possible asymmetry warrants further evaluation. In the right breast, no findings suspicious for malignancy. IMPRESSION: Further evaluation is suggested for possible asymmetry in the left breast. RECOMMENDATION: Diagnostic mammogram and possibly ultrasound of the left  breast. (Code:[id number]) The patient will be contacted regarding the findings, and additional imaging will be scheduled. BI-RADS CATEGORY  0: Incomplete: Need additional imaging evaluation. Electronically Signed   By: Frederico Hamman M.D.   On: 04/23/2023 09:01   XR C-ARM NO REPORT  Result Date: 04/04/2023 Please see Notes tab for imaging impression.  Epidural Steroid injection  Result Date: 04/04/2023 Tyrell Antonio, MD     04/15/2023  8:27 PM Cervical Epidural Steroid Injection - Interlaminar Approach with Fluoroscopic Guidance Patient: Joanne Molina     Date of Birth: April 03, 1980 MRN: [id number] PCP: Shirline Frees, NP     Visit Date: 04/04/2023  Universal Protocol:   Date/Time: 04/15/248:26 PM Consent Given By: the patient Position: PRONE Additional Comments: Vital signs were monitored before and after the procedure. Patient was prepped and draped in the usual sterile fashion. The correct patient, procedure, and site was verified. Injection Procedure Details: Procedure diagnoses: Cervical radiculopathy [M54.12]  Meds Administered: Meds ordered this encounter Medications   methylPREDNISolone acetate (DEPO-MEDROL) injection 80 mg  Laterality: Left Location/Site: C7-T1 Needle: 3.5 in., 20 ga. Tuohy Needle Placement: Paramedian epidural space Findings:  -Comments: Excellent flow of contrast into the epidural space. Procedure Details: Using a paramedian approach from the side mentioned above, the region overlying the inferior lamina was localized under fluoroscopic visualization and the soft tissues overlying this structure were infiltrated with 4 ml. of 1% Lidocaine without Epinephrine. A # 20 gauge, Tuohy needle was inserted into the epidural space using a paramedian approach. The epidural space was localized using loss of resistance along with contralateral oblique bi-planar fluoroscopic views.  After negative aspirate for air, blood, and CSF, a 2 ml. volume of Isovue-250 was injected into the epidural space and the flow of contrast was observed. Radiographs were obtained for documentation purposes. The injectate was administered into the level noted above. Additional Comments: No complications occurred Dressing: 2 x 2 sterile gauze and Band-Aid  Post-procedure details: Patient was observed during the procedure. Post-procedure instructions were reviewed. Patient left the clinic in stable condition.    Assessment & Plan:   No problem-specific Assessment & Plan notes found for this encounter.     Glenford Bayley, NP 04/29/2023

## 2023-04-30 ENCOUNTER — Ambulatory Visit: Payer: BC Managed Care – PPO | Admitting: Primary Care

## 2023-04-30 ENCOUNTER — Encounter: Payer: Self-pay | Admitting: Primary Care

## 2023-04-30 VITALS — BP 118/78 | HR 85 | Temp 98.2°F | Ht 66.0 in | Wt 176.0 lb

## 2023-04-30 DIAGNOSIS — I10 Essential (primary) hypertension: Secondary | ICD-10-CM | POA: Diagnosis not present

## 2023-04-30 DIAGNOSIS — J329 Chronic sinusitis, unspecified: Secondary | ICD-10-CM | POA: Diagnosis not present

## 2023-04-30 DIAGNOSIS — J45991 Cough variant asthma: Secondary | ICD-10-CM | POA: Diagnosis not present

## 2023-04-30 MED ORDER — MONTELUKAST SODIUM 10 MG PO TABS: 10.0000 mg | ORAL_TABLET | Freq: Every day | ORAL | 3 refills | Status: DC

## 2023-04-30 MED ORDER — BISOPROLOL FUMARATE 5 MG PO TABS: 5.0000 mg | ORAL_TABLET | Freq: Every day | ORAL | 3 refills | Status: DC

## 2023-04-30 MED ORDER — PREDNISONE 10 MG PO TABS: ORAL_TABLET | ORAL | 0 refills | Status: AC

## 2023-04-30 NOTE — Assessment & Plan Note (Addendum)
-   Currently has sinus pain/pressure and sputum production  - CT imaging cervical spine in February 2024 showed diffuse paranasal sinus disease with fluid leve within left maxillary sinus suggesting acute sinusitis - Continue Singulair 10 mg at bedtime; Advised patient to start saline nasal rinse twice daily and mucinex 600mg  twice daily.  Rx prednisone taper as directed. Holding off on additional abx at this time.  - Referring back to ENT

## 2023-04-30 NOTE — Patient Instructions (Addendum)
Dupixent works by blocking proteins called interleukins. These proteins are part of the immune system and are involved in causing inflammation. By blocking these proteins, the drug reduces inflammation in your body and can help ease your symptoms  Recommendations:  Start Mucinex 600 mg twice daily Start saline nasal rinse twice daily Start prednisone taper a directed (RX sent) Continue Singulair 10 mg at bedtime Continue Flonase 2 sprays per nostril daily Starting paperwork for dupixent for asthma/recurrent sinusitis   Refer: ENT ZO:[id number] sinusitis   Follow-up: 6-8 weeks with Dr. Sherene Sires (if no openings Beth is ok)   Dupilumab Injection What is this medication? DUPILUMAB (doo PIL ue mab) treats some types of skin conditions, such as eczema. It may also be used to treat conditions that cause inflammation in the sinuses and esophagus. It can be used to prevent the symptoms of asthma. It works by decreasing inflammation. Do not use it to treat a sudden asthma attack. This medicine may be used for other purposes; ask your health care provider or pharmacist if you have questions. COMMON BRAND NAME(S): DUPIXENT What should I tell my care team before I take this medication? They need to know if you have any of these conditions: Asthma Eye disease Parasite (worm) infection An unusual or allergic reaction to dupilumab, other medications, foods, dyes, or preservatives Pregnant or trying to get pregnant Breastfeeding How should I use this medication? This medication is injected under the skin. You will be taught how to prepare and give it. Take it as directed on the prescription label. Keep taking it unless your care team tells you to stop. If you use a pen, be sure to take off the outer needle cover before using the dose. It is important that you put your used needles and syringes in a special sharps container. Do not put them in a trash can. If you do not have a sharps container, call your  pharmacist or care team to get one. This medication comes with INSTRUCTIONS FOR USE. Ask your pharmacist for directions on how to use this medication. Read the information carefully. Talk to your care team if you have questions. Talk to your care team about the use of this medication in children. While this medication may be prescribed for children as young as 6 months for selected conditions, precautions do apply. Overdosage: If you think you have taken too much of this medicine contact a poison control center or emergency room at once. NOTE: This medicine is only for you. Do not share this medicine with others. What if I miss a dose? It is important not to miss any doses. Talk to your care team about what to do if you miss a dose. What may interact with this medication? Live virus vaccines This list may not describe all possible interactions. Give your health care provider a list of all the medicines, herbs, non-prescription drugs, or dietary supplements you use. Also tell them if you smoke, drink alcohol, or use illegal drugs. Some items may interact with your medicine. What should I watch for while using this medication? Visit your care team for regular checks on your progress. Tell your care team if your symptoms do not start to get better or if they get worse. This medication can decrease the response to a vaccine. If you need to get vaccinated, tell your care team if you have received this medication. Talk to your care team to see if a different vaccination schedule is needed. If you take this medication  for asthma, you and your care team should develop an Asthma Action Plan that is just for you. Be sure to know what to do if you are in the yellow (asthma is getting worse) or red (medical alert) zones. This medication is not used to treat sudden breathing problems. What side effects may I notice from receiving this medication? Side effects that you should report to your care team as soon as  possible: Allergic reactions--skin rash, itching, hives, swelling of the face, lips, tongue, or throat Eye pain, redness, irritation, or discharge with blurry or decreased vision Unusual weakness or fatigue, fever, headache, skin rash, muscle or joint pain, loss of appetite, pain, tingling, or numbness in the hands or feet Side effects that usually do not require medical attention (report these to your care team if they continue or are bothersome): Cold sores Dry eyes Joint pain Pain, redness, or irritation at injection site Sore throat Trouble sleeping This list may not describe all possible side effects. Call your doctor for medical advice about side effects. You may report side effects to FDA at 1-800-FDA-1088. Where should I keep my medication? Keep out of the reach of children and pets. Store in the refrigerator or at room temperature up to 25 degrees C (77 degrees F). Do not freeze. Keep this medication in the original packaging until you are ready to take it. Protect from light. Refrigeration (preferred): Get rid of any unused medication after the expiration date. Room temperature: This medication may be stored at room temperature for up to 14 days. Get rid of any unused medication after 14 days or after it expires, whichever is first. To get rid of medications that are no longer needed or have expired: Take the medication to a medication take-back program. Check with your pharmacy or law enforcement to find a location. If you cannot return the medication, ask your pharmacist or care team how to get rid of this medication safely. NOTE: This sheet is a summary. It may not cover all possible information. If you have questions about this medicine, talk to your doctor, pharmacist, or health care provider.  2023 Elsevier/Gold Standard (2022-09-17 00:00:00)

## 2023-04-30 NOTE — Assessment & Plan Note (Addendum)
-   Recurrent sinusitis and asthma exacerbations requiring frequent treatment with antibiotics and prednisone. PND/rhinitis exacerbating cough, shortness of breath and wheezing - Positive RAST for dogs, cats, dust and grass.  Eosinophils 300, IgE 44 - Discussed starting Biologics with patient, we have started paperwork for Dupixent  - Continue Symbicort 80 mcg 2 puffs twice daily and as needed albuterol 2 puffs every 4-6 hours as needed for breakthrough shortness of breath or wheezing

## 2023-04-30 NOTE — Assessment & Plan Note (Signed)
-   Stable; BP 118/78 - Changed from inderol to bisoprolol 5mg  daily in May 2020 - Medication refill provided today

## 2023-05-03 ENCOUNTER — Ambulatory Visit
Admission: RE | Admit: 2023-05-03 | Discharge: 2023-05-03 | Disposition: A | Discharge status: Home / Self Care | Payer: BC Managed Care – PPO | Source: Ambulatory Visit | Attending: Adult Health | Admitting: Adult Health

## 2023-05-03 DIAGNOSIS — R928 Other abnormal and inconclusive findings on diagnostic imaging of breast: Secondary | ICD-10-CM

## 2023-05-06 ENCOUNTER — Telehealth: Payer: Self-pay

## 2023-05-06 NOTE — Telephone Encounter (Signed)
Received New start paperwork for DUPIXENT. Will update as we work through the benefits process.  Submitted a Prior Authorization request to [facility] for DUPIXENT via CoverMyMeds. Will update once we receive a response.  Key: [id number]

## 2023-05-08 ENCOUNTER — Other Ambulatory Visit: Payer: Self-pay | Admitting: Adult Health

## 2023-05-08 DIAGNOSIS — N6489 Other specified disorders of breast: Secondary | ICD-10-CM

## 2023-05-12 ENCOUNTER — Emergency Department (HOSPITAL_BASED_OUTPATIENT_CLINIC_OR_DEPARTMENT_OTHER): Payer: BC Managed Care – PPO

## 2023-05-12 ENCOUNTER — Encounter (HOSPITAL_BASED_OUTPATIENT_CLINIC_OR_DEPARTMENT_OTHER): Payer: Self-pay | Admitting: Emergency Medicine

## 2023-05-12 ENCOUNTER — Emergency Department (HOSPITAL_BASED_OUTPATIENT_CLINIC_OR_DEPARTMENT_OTHER)
Admission: EM | Admit: 2023-05-12 | Discharge: 2023-05-12 | Disposition: A | Discharge status: Home / Self Care | Payer: BC Managed Care – PPO | Attending: Emergency Medicine | Admitting: Emergency Medicine

## 2023-05-12 ENCOUNTER — Other Ambulatory Visit: Payer: Self-pay

## 2023-05-12 DIAGNOSIS — R109 Unspecified abdominal pain: Secondary | ICD-10-CM | POA: Insufficient documentation

## 2023-05-12 DIAGNOSIS — R197 Diarrhea, unspecified: Secondary | ICD-10-CM | POA: Diagnosis not present

## 2023-05-12 LAB — URINALYSIS, ROUTINE W REFLEX MICROSCOPIC
Bilirubin Urine: NEGATIVE
Glucose, UA: NEGATIVE mg/dL
Hgb urine dipstick: NEGATIVE
Ketones, ur: NEGATIVE mg/dL
Leukocytes,Ua: NEGATIVE
Nitrite: NEGATIVE
Protein, ur: NEGATIVE mg/dL
Specific Gravity, Urine: 1.016 (ref 1.005–1.030)
pH: 5 (ref 5.0–8.0)

## 2023-05-12 LAB — CBC
HCT: 42.2 % (ref 36.0–46.0)
Hemoglobin: 14.6 g/dL (ref 12.0–15.0)
MCH: 32.3 pg (ref 26.0–34.0)
MCHC: 34.6 g/dL (ref 30.0–36.0)
MCV: 93.4 fL (ref 80.0–100.0)
Platelets: 217 10*3/uL (ref 150–400)
RBC: 4.52 MIL/uL (ref 3.87–5.11)
RDW: 12.6 % (ref 11.5–15.5)
WBC: 10.9 10*3/uL — ABNORMAL HIGH (ref 4.0–10.5)
nRBC: 0 % (ref 0.0–0.2)

## 2023-05-12 LAB — COMPREHENSIVE METABOLIC PANEL
ALT: 20 U/L (ref 0–44)
AST: 22 U/L (ref 15–41)
Albumin: 4.7 g/dL (ref 3.5–5.0)
Alkaline Phosphatase: 42 U/L (ref 38–126)
Anion gap: 8 (ref 5–15)
BUN: 15 mg/dL (ref 6–20)
CO2: 26 mmol/L (ref 22–32)
Calcium: 9.4 mg/dL (ref 8.9–10.3)
Chloride: 104 mmol/L (ref 98–111)
Creatinine, Ser: 1.07 mg/dL — ABNORMAL HIGH (ref 0.44–1.00)
GFR, Estimated: >60 mL/min (ref >60)
Glucose, Bld: 88 mg/dL (ref 70–99)
Potassium: 4.5 mmol/L (ref 3.5–5.1)
Sodium: 138 mmol/L (ref 135–145)
Total Bilirubin: 0.6 mg/dL (ref 0.3–1.2)
Total Protein: 7.3 g/dL (ref 6.5–8.1)

## 2023-05-12 LAB — LIPASE, BLOOD: Lipase: 38 U/L (ref 11–51)

## 2023-05-12 MED ORDER — IOHEXOL 300 MG/ML  SOLN
100.0000 mL | Freq: Once | INTRAMUSCULAR | Status: AC | PRN
  Administered 2023-05-12: 80 mL via INTRAVENOUS

## 2023-05-12 MED ORDER — SODIUM CHLORIDE 0.9 % IV BOLUS
1000.0000 mL | Freq: Once | INTRAVENOUS | Status: AC
  Administered 2023-05-12: 1000 mL via INTRAVENOUS

## 2023-05-12 MED ORDER — KETOROLAC TROMETHAMINE 30 MG/ML IJ SOLN
30.0000 mg | Freq: Once | INTRAMUSCULAR | Status: AC
  Administered 2023-05-12: 30 mg via INTRAVENOUS
  Filled 2023-05-12: qty 1

## 2023-05-12 NOTE — ED Provider Notes (Signed)
[place] EMERGENCY DEPARTMENT AT [facility] Provider Note   CSN: [id number] Arrival date & time: 05/12/23  1431     History  No chief complaint on file.  HPI Joanne Molina is a 43 y.o. female who is status post cholecystectomy and hysterectomy presenting for abdominal pain.  States it has been going for couple months but has gotten worse in the last couple days. Abdominal pain is located in the right side of her abdomen.  Feels sharp and intermittent.  Nonradiating.  Endorses diarrhea for 4 weeks but no nausea or vomiting.  Denies urinary changes.  Denies fever or chills.  Denies abnormal vaginal bleeding or discharge.  States she has follow-up with GI on Thursday but could not wait any longer due to the pain.  Patient states she is concerned she may have appendicitis.  HPI     Home Medications Prior to Admission medications   Medication Sig Start Date End Date Taking? Authorizing Provider  albuterol (VENTOLIN HFA) 108 (90 Base) MCG/ACT inhaler Inhale 1 puff into the lungs every 6 (six) hours as needed for wheezing or shortness of breath. 09/21/22   Glenford Bayley, NP  ALPRAZolam Prudy Feeler) 1 MG tablet TAKE 1 TABLET BY MOUTH TWICE A DAY AS NEEDED FOR ANXIETY 03/01/23   Claybon Jabs, Rosey Bath T, PA-C  bisoprolol (ZEBETA) 5 MG tablet Take 1 tablet (5 mg total) by mouth daily. 04/30/23   Glenford Bayley, NP  budesonide-formoterol [facility]) 80-4.5 MCG/ACT inhaler Inhale 2 puffs into the lungs 2 (two) times daily. 09/21/22   Glenford Bayley, NP  famotidine (PEPCID) 40 MG tablet Take 40 mg by mouth daily.    [provider]  gabapentin (NEURONTIN) 300 MG capsule TAKE 1 CAPSULE BY MOUTH THREE TIMES A DAY 03/01/23   Hurst, Rosey Bath T, PA-C  montelukast (SINGULAIR) 10 MG tablet Take 1 tablet (10 mg total) by mouth at bedtime. 04/30/23   Glenford Bayley, NP  predniSONE (DELTASONE) 10 MG tablet Take 4 tablets (40 mg total) by mouth daily with breakfast for 3 days, THEN 3 tablets (30  mg total) daily with breakfast for 3 days, THEN 2 tablets (20 mg total) daily with breakfast for 3 days, THEN 1 tablet (10 mg total) daily with breakfast for 3 days. 04/30/23 05/12/23  Glenford Bayley, NP  sertraline (ZOLOFT) 100 MG tablet TAKE 2.5 TABLETS BY MOUTH DAILY. 03/01/23   Cherie Ouch, PA-C      Allergies    Erythromycin    Review of Systems   See HPI for pertinent positives   Physical Exam   Vitals:   05/12/23 1702 05/12/23 1730  BP: 118/77 125/79  Pulse: 71 76  Resp: 16 16  Temp:    SpO2: 100% 100%    CONSTITUTIONAL:  well-appearing, NAD NEURO:  Alert and oriented x 3, CN 3-12 grossly intact EYES:  eyes equal and reactive ENT/NECK:  Supple, no stridor  CARDIO:  Regular rate and rhythm, appears well-perfused  PULM:  No respiratory distress, CTAB GI/GU:  non-distended, soft, tenderness on the right side of her abdomen MSK/SPINE:  No gross deformities, no edema, moves all extremities  SKIN:  no rash, atraumatic  *Additional and/or pertinent findings included in MDM below   ED Results / Procedures / Treatments   Labs (all labs ordered are listed, but only abnormal results are displayed) Labs Reviewed  COMPREHENSIVE METABOLIC PANEL - Abnormal; Notable for the following components:      Result Value   Creatinine,  Ser 1.07 (*)    All other components within normal limits  CBC - Abnormal; Notable for the following components:   WBC 10.9 (*)    All other components within normal limits  LIPASE, BLOOD  URINALYSIS, ROUTINE W REFLEX MICROSCOPIC    EKG None  Radiology CT ABDOMEN PELVIS W CONTRAST  Result Date: 05/12/2023 CLINICAL DATA:  Abdominal pain.  No appetite EXAM: CT ABDOMEN AND PELVIS WITH CONTRAST TECHNIQUE: Multidetector CT imaging of the abdomen and pelvis was performed using the standard protocol following bolus administration of intravenous contrast. RADIATION DOSE REDUCTION: This exam was performed according to the departmental dose-optimization  program which includes automated exposure control, adjustment of the mA and/or kV according to patient size and/or use of iterative reconstruction technique. CONTRAST:  80mL OMNIPAQUE IOHEXOL 300 MG/ML  SOLN COMPARISON:  None Available. FINDINGS: Lower chest: Lung bases are clear. Hepatobiliary: Small hypoenhancing lesion in the inferior aspect of the RIGHT hepatic lobe measuring 8 mm on image 26/2 is favored a small hemangioma. No change compared to remote CT 05/10/2017. Post cholecystectomy. No biliary duct dilatation Pancreas: Pancreas is normal. No ductal dilatation. No pancreatic inflammation. Small benign lipoma in the head of the pancreas measuring 7 mm. Spleen: Normal spleen Adrenals/urinary tract: Adrenal glands and kidneys are normal. Partially duplicated collecting system on the RIGHT. The ureters and bladder normal. Stomach/Bowel: Stomach, small-bowel and cecum are normal. The appendix is not identified but there is no pericecal inflammation to suggest appendicitis. The colon and rectosigmoid colon are normal. Vascular/Lymphatic: Abdominal aorta is normal caliber. No periportal or retroperitoneal adenopathy. No pelvic adenopathy. Reproductive: Post hysterectomy.  Adnexa unremarkable Other: No free fluid. Musculoskeletal: No aggressive osseous lesion. IMPRESSION: 1. No acute findings in the abdomen pelvis. 2. Appendix not identified but no pericecal inflammation to suggest appendicitis. 3. Post cholecystectomy and hysterectomy. Electronically Signed   By: Genevive Bi M.D.   On: 05/12/2023 17:25    Procedures Procedures    Medications Ordered in ED Medications  sodium chloride 0.9 % bolus 1,000 mL (0 mLs Intravenous Stopped 05/12/23 1659)  ketorolac (TORADOL) 30 MG/ML injection 30 mg (30 mg Intravenous Given 05/12/23 1549)  iohexol (OMNIPAQUE) 300 MG/ML solution 100 mL (80 mLs Intravenous Contrast Given 05/12/23 1632)    ED Course/ Medical Decision Making/ A&P                              Medical Decision Making Amount and/or Complexity of Data Reviewed Labs: ordered. Radiology: ordered.  Risk Prescription drug management.   Initial Impression and Ddx 43 yo well appearing female presenting for abdominal pain.  Exam notable for right-sided abdominal tenderness.  DDx includes appendicitis, diverticulitis, hepatitis, nephrolithiasis or pyelonephritis. Patient PMH that increases complexity of ED encounter:  none  Interpretation of Diagnostics - I independent reviewed and interpreted the labs as followed: Mild leukocytosis and elevated creatinine  - I independently visualized the following imaging with scope of interpretation limited to determining acute life threatening conditions related to emergency care: CT abdomen/pelvis, which revealed nothing acute  Patient Reassessment and Ultimate Disposition/Management Patient stated abdominal pain was improved after treatment.  CT without acute findings.  Overall appears clinically well.  Suspicion for intra-abdominal infection is low.  Advised her to go to her scheduled appointment with GI on Thursday.  Discussed pertinent return precautions.  Vital stable at discharge.  Patient management required discussion with the following services or consulting groups:  None  Complexity  of Problems Addressed Acute complicated illness or Injury  Additional Data Reviewed and Analyzed Further history obtained from: Prior ED visit notes  Patient Encounter Risk Assessment None         Final Clinical Impression(s) / ED Diagnoses Final diagnoses:  Abdominal pain, unspecified abdominal location    Rx / DC Orders ED Discharge Orders     None         Gareth Eagle, PA-C 05/12/23 1759    Linwood Dibbles, MD 05/13/23 1606

## 2023-05-12 NOTE — ED Triage Notes (Signed)
Pt been having right abdominal pain ,pressure,has to lean forward to void, no appetite. This has been going on for weeks, has appt for GI on Thursday, but feels worse today

## 2023-05-12 NOTE — Discharge Instructions (Signed)
Evaluation today was overall reassuring.  CT scan was negative.  Recommend you follow-up with your GI doctor on Thursday as planned.  If you have worsening abdominal pain, nausea vomiting diarrhea, blood in the stool or vomit, or urinary changes or any other concerning symptom please return emergency department further evaluation.

## 2023-05-13 NOTE — Telephone Encounter (Signed)
Received a fax regarding Prior Authorization from [facility] for DUPIXENT. Authorization has been DENIED because, per letter, chart notes submitted indicate that pt is currently being maintained on LOW DOSE Symbicort which does not meet the insurance requirement of medium-to-high dose inhaled corticosteroid.  Per review of encounter history, pt was previously switched from the to during an OV on 09/21/22 because pt's condition was observed stable enough to consider deescalating therapy. We will likely be unable to overturn decision without being able to provide chart notes stating that pt's symptoms are being inadequately managed despite high dose ICS treatment (plus additional controller, however she is already in compliance with requirement at this time).  Will route to [employer] for advisement on how she would like to proceed.

## 2023-05-14 DIAGNOSIS — R635 Abnormal weight gain: Secondary | ICD-10-CM | POA: Insufficient documentation

## 2023-05-14 DIAGNOSIS — R194 Change in bowel habit: Secondary | ICD-10-CM | POA: Insufficient documentation

## 2023-05-14 DIAGNOSIS — R1031 Right lower quadrant pain: Secondary | ICD-10-CM | POA: Insufficient documentation

## 2023-05-14 DIAGNOSIS — R197 Diarrhea, unspecified: Secondary | ICD-10-CM | POA: Insufficient documentation

## 2023-05-14 DIAGNOSIS — R6881 Early satiety: Secondary | ICD-10-CM | POA: Insufficient documentation

## 2023-05-14 DIAGNOSIS — R109 Unspecified abdominal pain: Secondary | ICD-10-CM | POA: Insufficient documentation

## 2023-05-14 DIAGNOSIS — K219 Gastro-esophageal reflux disease without esophagitis: Secondary | ICD-10-CM | POA: Insufficient documentation

## 2023-05-14 DIAGNOSIS — B9681 Helicobacter pylori [H. pylori] as the cause of diseases classified elsewhere: Secondary | ICD-10-CM | POA: Insufficient documentation

## 2023-05-14 DIAGNOSIS — R634 Abnormal weight loss: Secondary | ICD-10-CM | POA: Insufficient documentation

## 2023-05-14 DIAGNOSIS — R1013 Epigastric pain: Secondary | ICD-10-CM | POA: Insufficient documentation

## 2023-05-14 DIAGNOSIS — R143 Flatulence: Secondary | ICD-10-CM | POA: Insufficient documentation

## 2023-05-14 NOTE — Telephone Encounter (Signed)
Pt called the office about the Dupixent stating that she had spoken to [facility] about the Dupixent being denied. They told her that clinical information seemed to have been missing which might be the reason why it was denied.  I read to her all the information from [place] about why the Dupixent was denied and told her that this has been sent to [facility] for her to review to see how she wants to proceed with the medication. Stated to pt that once Beth responded to pharmacy team about this, someone from our office would reach back out to her to let her know how things should proceed and she verbalized understanding.

## 2023-05-16 ENCOUNTER — Encounter: Payer: Self-pay | Admitting: Physician Assistant

## 2023-05-16 ENCOUNTER — Ambulatory Visit: Payer: BC Managed Care – PPO | Admitting: Physician Assistant

## 2023-05-16 NOTE — Telephone Encounter (Signed)
Please let patient know we will need to try her back on a high dose inhaler before re-applying for Dupixent. Sorry about that, I thought we would be able to get it covered for sinusitis. If cough is bothering her any we can send in RX Symbicort two puffs twice daily. Otherwise if mainly sinusitis issue we may need to refer to allergy for biologics

## 2023-05-16 NOTE — Telephone Encounter (Signed)
Called and spoke with pt letting her know the info per [place].  While speaking with pt, she said that she still had a prescription for the Symbicort 160 at the pharmacy and said October 2023, she had a bad flare-up and when she had the flare-up, she got Rx for Symbicort 160 and not the Symbicort 80 (medlist is not up-do-date).  Pt said after this bad flare-up she decided to stay on the  Symbicort 160 and never picked up the Rx for Symbicort 80.  [facility] said that [facility] will be able to [facility] that she has only been filling the Symbicort 160.  I have updated pt's medication list showing Symbicort 160 and have removed the Symbicort 80 off of her medication list.  Routing this info to [place] as well as to our pharmacy team to see if we can possibly do an appeal to get this overturned for the approval of the Dupixent since pt has only been on the Symbicort 160.

## 2023-05-16 NOTE — Telephone Encounter (Signed)
Reinitiated PA for Dupixent on CMM. Attached screenshot of Symbicort dispense history  WUX:[id number]
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
## 2023-05-17 NOTE — Telephone Encounter (Signed)
Received notification from [facility] regarding a prior authorization for DUPIXENT. Authorization has been APPROVED from 05/16/23 to 11/16/23. Approval letter sent to scan center.  Unable to run test claim because patient must fill through [facility]: [phone]  Authorization # [id number]  Will need to enroll patient into Dupixent copay card  Chesley Mires, PharmD, MPH, BCPS, CPP Clinical Pharmacist (Rheumatology and Pulmonology)

## 2023-05-17 NOTE — Telephone Encounter (Signed)
Thank you Devki, great job

## 2023-05-20 NOTE — Telephone Encounter (Signed)
ATC patient to schedule Dupixent new start. Unable to reach. Left VM requesting return call  Joanne Molina, PharmD, MPH, BCPS, CPP Clinical Pharmacist (Rheumatology and Pulmonology) 

## 2023-05-20 NOTE — Progress Notes (Unsigned)
HPI Patient presents today to [facility] to see pharmacy team for Dupixent new start.  Past medical history includes severe persistent asthma with recurrent sinusitis. She has not seen ENT yet (history of nasal polyps therefore unknown), but has upcoming appt to establish care. Reports that fall and winter is when she expected asthma exacerbations to occur  Has received four prednisone tapers since October 2023.  Respiratory Medications Current regimen: Symbicort 160-4.5 mcg (2 puffs twice daily), montelukast 10mg  nightly Patient reports no known adherence challenges  OBJECTIVE Allergies  Allergen Reactions   Erythromycin Nausea Only    Outpatient Encounter Medications as of 05/21/2023  Medication Sig   albuterol (VENTOLIN HFA) 108 (90 Base) MCG/ACT inhaler Inhale 1 puff into the lungs every 6 (six) hours as needed for wheezing or shortness of breath.   ALPRAZolam (XANAX) 1 MG tablet TAKE 1 TABLET BY MOUTH TWICE A DAY AS NEEDED FOR ANXIETY   bisoprolol (ZEBETA) 5 MG tablet Take 1 tablet (5 mg total) by mouth daily.   budesonide-formoterol (SYMBICORT) 160-4.5 MCG/ACT inhaler Inhale 2 puffs into the lungs in the morning and at bedtime.   famotidine (PEPCID) 40 MG tablet Take 40 mg by mouth daily.   gabapentin (NEURONTIN) 300 MG capsule TAKE 1 CAPSULE BY MOUTH THREE TIMES A DAY   montelukast (SINGULAIR) 10 MG tablet Take 1 tablet (10 mg total) by mouth at bedtime.   sertraline (ZOLOFT) 100 MG tablet TAKE 2.5 TABLETS BY MOUTH DAILY.   No facility-administered encounter medications on file as of 05/21/2023.     Immunization History  Administered Date(s) Administered   Influenza Split 09/09/2012   Tdap 05/04/2016     PFTs    Latest Ref Rng & Units 12/29/2021   10:49 AM  PFT Results  FVC-Pre L 4.40   FVC-Predicted Pre % 112   FVC-Post L 4.47   FVC-Predicted Post % 113   Pre FEV1/FVC % % 73   Post FEV1/FCV % % 73   FEV1-Pre L 3.20   FEV1-Predicted Pre % 100    FEV1-Post L 3.28   DLCO uncorrected ml/min/mmHg 24.61   DLCO UNC% % 105   DLCO corrected ml/min/mmHg 24.61   DLCO COR %Predicted % 105   DLVA Predicted % 101   TLC L 5.78   TLC % Predicted % 107   RV % Predicted % 82     Eosinophils Most recent blood eosinophil count was 300 cells/microL taken on 03/08/23.   IgE: 44 on 01/02/22  Assessment   Biologics training for dupilumab (Dupixent)  Goals of therapy: Mechanism: human monoclonal IgG4 antibody that inhibits interleukin-4 and interleukin-13 cytokine-induced responses, including release of proinflammatory cytokines, chemokines, and IgE Reviewed that Dupixent is add-on medication and patient must continue maintenance inhaler regimen. Response to therapy: may take 4 months to determine efficacy. Discussed that patients generally feel improvement sooner than 4 months.  Side effects: injection site reaction (6-18%), antibody development (5-16%), ophthalmic conjunctivitis (2-16%), transient blood eosinophilia (1-2%)  Dose: 600mg  at Week 0 (administered today in clinic) followed by 300mg  every 14 days thereafter  Administration/Storage:  Reviewed administration sites of thigh or abdomen (at least 2-3 inches away from abdomen). Reviewed the upper arm is only appropriate if caregiver is administering injection  Do not shake pen/syringe as this could lead to product foaming or precipitation. Do not use if solution is discolored or contains particulate matter or if window on prefilled pen is yellow (indicates pen has been used).  Reviewed storage of medication  in refrigerator. Reviewed that Dupixent can be stored at room temperature in unopened carton for up to 14 days.  Access: Approval of Dupixent through: insurance Patient enrolled into copay card program to help with copay assistance.  Patient self-administered Dupixent 300mg /49ml x 2 (total dose 600mg ) in right lower abdomen and left lower abdomen using sample Dupixent 300mg /76mL  autoinjector pen NDC: [phone] Lot: [id number] Expiration: 11/29/2024  Patient monitored for 30 minutes for adverse reaction.  Patient tolerated without issue. Injection site checked and no swelling note. Slight redness on right side only but patient denies any itchiness or irritation.  Medication Reconciliation  A drug regimen assessment was performed, including review of allergies, interactions, disease-state management, dosing and immunization history. Medications were reviewed with the patient, including name, instructions, indication, goals of therapy, potential side effects, importance of adherence, and safe use.  Drug interaction(s): none noted  Immunizations  Patient is indicated for the influenzae, pneumonia, and shingles vaccinations. Patient has received *** COVID19 vaccines.   PLAN Continue Dupixent 300mg  every 14 days.  Next dose is due 06/04/2023 and every 14 days thereafter. Rx [facility]: [phone].  Patient provided with pharmacy phone number and advised to call later this week to schedule shipment to home. Patient provided with copay card information to provide to pharmacy if quoted copay exceeds $5 per month. Continue maintenance asthma regimen of: Symbicort 160-4.5 mcg (2 puffs twice daily), montelukast 10mg  nightly  All questions encouraged and answered.  Instructed patient to reach out with any further questions or concerns.  Thank you for allowing pharmacy to participate in this patient's care.  This appointment required 45 minutes of patient care (this includes precharting, chart review, review of results, face-to-face care, etc.).   Chesley Mires, PharmD, MPH, BCPS, CPP Clinical Pharmacist (Rheumatology and Pulmonology)

## 2023-05-20 NOTE — Telephone Encounter (Signed)
Patient scheduled for Dupixent new start on 05/21/23. Will use sample  Chesley Mires, PharmD, MPH, BCPS, CPP Clinical Pharmacist (Rheumatology and Pulmonology)

## 2023-05-20 NOTE — Telephone Encounter (Signed)
Pt has been enrolled into copay card program, copy of copay card has been sent to scan center for retention.  RxBIN: [id number] RxPCN: [id number] RxGRP: [id number] ID: [id number]

## 2023-05-20 NOTE — Patient Instructions (Signed)
Your next DUPIXENT dose is due on 06/04/23, 06/18/23, and every 14 days thereafter  CONTINUE Symbicort 160-4.5 mcg (2 puffs twice daily) and montelukast 10mg  nightly  Your prescription will be shipped from [facility]. Their phone number is [phone] Please call to schedule shipment and confirm address. They will mail your medication to your home.  Your copay should be affordable. If you call the pharmacy and it is not affordable, please double-check that they are billing through your copay card as secondary coverage. That copay card information is: RxBIN: [id number] RxPCN: [id number] RxGRP: [id number] ID: [id number]  You will need to be seen by your provider in 3 to 4 months to assess how DUPIXENT is working for you. Please ensure you have a follow-up appointment scheduled in September or October 2024. Call our clinic if you need to make this appointment.  How to manage an injection site reaction: Remember the 5 C's: COUNTER - leave on the counter at least 30 minutes but up to overnight to bring medication to room temperature. This may help prevent stinging COLD - place something cold (like an ice gel pack or cold water bottle) on the injection site just before cleansing with alcohol. This may help reduce pain CLARITIN - use Claritin (generic name is loratadine) for the first two weeks of treatment or the day of, the day before, and the day after injecting. This will help to minimize injection site reactions CORTISONE CREAM - apply if injection site is irritated and itching CALL ME - if injection site reaction is bigger than the size of your fist, looks infected, blisters, or if you develop hives

## 2023-05-21 ENCOUNTER — Telehealth: Payer: BC Managed Care – PPO | Admitting: Physician Assistant

## 2023-05-21 ENCOUNTER — Ambulatory Visit: Payer: BC Managed Care – PPO | Admitting: Pharmacist

## 2023-05-21 DIAGNOSIS — Z7189 Other specified counseling: Secondary | ICD-10-CM

## 2023-05-21 DIAGNOSIS — K649 Unspecified hemorrhoids: Secondary | ICD-10-CM

## 2023-05-21 DIAGNOSIS — J329 Chronic sinusitis, unspecified: Secondary | ICD-10-CM

## 2023-05-21 DIAGNOSIS — J45991 Cough variant asthma: Secondary | ICD-10-CM

## 2023-05-21 MED ORDER — HYDROCORTISONE ACETATE 25 MG RE SUPP
25.0000 mg | Freq: Two times a day (BID) | RECTAL | 0 refills | Status: DC
Start: 2023-05-21 — End: 2023-07-31

## 2023-05-21 MED ORDER — DUPIXENT 300 MG/2ML ~~LOC~~ SOAJ
300.0000 mg | SUBCUTANEOUS | 5 refills | Status: DC
Start: 2023-05-21 — End: 2023-10-22

## 2023-05-21 NOTE — Progress Notes (Signed)
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
## 2023-06-06 ENCOUNTER — Other Ambulatory Visit: Payer: Self-pay

## 2023-06-06 ENCOUNTER — Encounter: Payer: Self-pay | Admitting: Physician Assistant

## 2023-06-06 ENCOUNTER — Ambulatory Visit: Payer: BC Managed Care – PPO | Admitting: Physician Assistant

## 2023-06-06 VITALS — BP 121/80 | HR 71 | Temp 98.3°F | Ht 67.0 in | Wt 178.0 lb

## 2023-06-06 DIAGNOSIS — K2960 Other gastritis without bleeding: Secondary | ICD-10-CM | POA: Diagnosis not present

## 2023-06-06 DIAGNOSIS — R1084 Generalized abdominal pain: Secondary | ICD-10-CM | POA: Diagnosis not present

## 2023-06-06 DIAGNOSIS — A048 Other specified bacterial intestinal infections: Secondary | ICD-10-CM

## 2023-06-06 DIAGNOSIS — R198 Other specified symptoms and signs involving the digestive system and abdomen: Secondary | ICD-10-CM | POA: Diagnosis not present

## 2023-06-06 DIAGNOSIS — Z1211 Encounter for screening for malignant neoplasm of colon: Secondary | ICD-10-CM

## 2023-06-06 DIAGNOSIS — K582 Mixed irritable bowel syndrome: Secondary | ICD-10-CM

## 2023-06-06 DIAGNOSIS — Z800 Family history of malignant neoplasm of digestive organs: Secondary | ICD-10-CM

## 2023-06-06 MED ORDER — DICYCLOMINE HCL 10 MG PO CAPS
10.0000 mg | ORAL_CAPSULE | Freq: Three times a day (TID) | ORAL | 2 refills | Status: AC
Start: 2023-06-06 — End: 2024-11-24

## 2023-06-06 MED ORDER — PEG 3350-KCL-NA BICARB-NACL 420 G PO SOLR: 4000.0000 mL | Freq: Once | ORAL | 0 refills | Status: AC

## 2023-06-06 NOTE — Patient Instructions (Signed)
Nice to meet you today! For IBS I recommend try: Benefiber Powder, Mix 1 TBSP in a drink once daily. Align Probiotic, 1 capsule once daily. Prescription: Dicyclomine 10mg  1 tab 3 times daily as needed for cramping.

## 2023-06-24 ENCOUNTER — Encounter: Payer: Self-pay | Admitting: Primary Care

## 2023-06-24 ENCOUNTER — Ambulatory Visit (INDEPENDENT_AMBULATORY_CARE_PROVIDER_SITE_OTHER): Payer: BC Managed Care – PPO | Admitting: Primary Care

## 2023-06-24 VITALS — BP 108/62 | HR 64 | Temp 98.3°F | Ht 66.5 in | Wt 178.8 lb

## 2023-06-24 DIAGNOSIS — J329 Chronic sinusitis, unspecified: Secondary | ICD-10-CM

## 2023-06-24 DIAGNOSIS — J45991 Cough variant asthma: Secondary | ICD-10-CM

## 2023-06-24 MED ORDER — DOXYCYCLINE HYCLATE 100 MG PO TABS
100.0000 mg | ORAL_TABLET | Freq: Two times a day (BID) | ORAL | 0 refills | Status: DC
Start: 2023-06-24 — End: 2023-07-31

## 2023-06-24 MED ORDER — IPRATROPIUM BROMIDE 0.06 % NA SOLN: 2.0000 | Freq: Three times a day (TID) | NASAL | 1 refills | Status: DC

## 2023-06-24 NOTE — Progress Notes (Signed)
@Patient  ID: Joanne Molina, female    DOB: 07-Feb-1980, 43 y.o.   MRN: [id number]  Chief Complaint  Patient presents with   Follow-up    Sinus pain and pressure persistent    Referring provider: Shirline Frees, NP  HPI: 43 year old female, never smoked. PMH significant for HTN, cough variant asthma, chronic rhinitis, hysterectomy. Patient of Dr. Sherene Sires, last seen by pulmonary NP on 09/19/21.  Previous [facility] pulmonary encounter: 09/19/2021 Follow up: Asthma  Patient returns for a follow-up visit.  Patient has underlying asthma.  She says she typically has a flare of her asthma every fall.  Over the last 2 years has been doing well up until this past 2 weeks.  Patient started having increased sinus congestion drainage.  She took a COVID test that was negative.  Symptoms continue to worsen despite using Mucinex Claritin and saline nasal rinses.  She was seen at a local urgent care and given a Z-Pak and a prednisone taper.  Patient said her symptoms continued without significant improvement.  She went to the emergency room on September 13, 2021.  COVID-19 test was negative.  Chest x-ray showed no acute process.  She was given a Depo-Medrol injection and started on a Medrol Dosepak.  Patient says yesterday she started having some improvement in symptoms.  She also changed from Symbicort 80 daily to Symbicort 160 twice daily.  She denies any hemoptysis chest pain orthopnea PND.  [name] a Radio producer.  She does not have any pets.  12/29/2021 Patient presents today for 3 month follow-up with PFTs. During last visit she was started on Singulair 10mg  at bedtime and changed from Claritin to Zyrtec. [name] maintained on Symbicort two puffs twice daily. She had RSV three weeks ago, she was evaluated by UC and she was treated with steroid dose pack. She has fully recovered. She feels higher dose Symbicort has made a big difference in her brething. She feels much improved since medications changes. Eos  absolute 200 in March 2022. She did not have lab work done but will today.   09/21/2022  Patient presents today for follow-up/cough variant asthma.  [name] doing well. Asthma is not currently exacerbated but this time of year does tend to flare. [name] dealing with sinusitis symptoms. She has frontal sinus pressure for last week.  Mucus is thick. Last sinus infection was in July. She takes mucinex, claritin and singulair daily. She does not tolerating Flonase d.t heart palpitations. She will use saline nasal spray as needed. [name] concerned about being on steroids for her asthma d/t hx anorexia. Needs refills today.  04/30/2023  Patient presents today for 6 month follow-up. PMH significant for cough variant asthma and recurrent sinusitis. Maintained Symbicort , Bioprolol, Singulair and saline nasal rinses. Referred to ENT during our last visit but was not able to go d/t family tragedy.  She continues to have recurrent sinusitis symptoms. Sinus drainage exacerbate her asthma symptoms. She will develop productive cough along with shortness of breath and wheezing symptoms. Currently complaining of facial pain/pressure. She gets sinus infections every 1-2 months. She was recently given steroids without much improvement, did not feel course was long enough. Eosinophils in March were 300. IgE 44. CT cervical spine in February 2024 showed diffuse paranasal sinus disease with fluid level within the left maxillary sinus, suggestin acute sinusitis.   06/24/2023 Patient presents today for 2 month follow-up. Hx cough variant asthma and recurrent sinusitis. Maintained on symbicort ,  bisoprolol, Singulair and saline nasal rinses. She gets sinus infections every 1-2 months which will exacerbate her asthma. Eosinophils in March were 300. IgE 44. CT cervical spine in February 2024 showed diffuse paranasal sinus disease with fluid level within the left maxillary sinus, suggesting acute sinusitis. We started the  process for Dupixent during our last visit. She received her first injection end of May.   Severe persistent asthma with recurrent sinusitis  Hx nasal polyps, has not seen ENT but will be establishing  She has been off and on prednisone since October 2023   Continues to have persistent sinus pain and pressure. [name] still getting up thick mucus from her chest and sinuses. Any upper respiratory infection/cold will settle in her chest. [name] using mucinex twice a day and sinus rinses.  Only last couple of days has she noticed pressure and dizziness.  She has not had to use albuterol since starting Dupixent  No asthma flare ups Doing well with injections, received two in the office and now giving herself them at home  CT abdomen showed clear lung bases in May 2024   She always improve with abx but relief doesn't last  Pulmonary function testing: 12/29/2021 FVC 4.47 (113%); FEV1 3.28 (102%), ratio 73, TLC 107%, DLCOunc 105% p using Symbicort 160 this morning. NO BD response.    Allergies  Allergen Reactions   Erythromycin Nausea Only    Immunization History  Administered Date(s) Administered   Influenza Split 09/09/2012   Tdap 05/04/2016    Past Medical History:  Diagnosis Date   Anxiety    panic attack   Cough variant asthma 05/14/2019   Depression    Gastropathy 10/05/2019   Migraine    S/P laparoscopic hysterectomy 06/29/2019    Tobacco History: Social History   Tobacco Use  Smoking Status Never   Passive exposure: Past  Smokeless Tobacco Never   Counseling given: Not Answered   Outpatient Medications Prior to Visit  Medication Sig Dispense Refill   albuterol (VENTOLIN HFA) 108 (90 Base) MCG/ACT inhaler Inhale 1 puff into the lungs every 6 (six) hours as needed for wheezing or shortness of breath. 18 g 11   ALPRAZolam (XANAX) 1 MG tablet TAKE 1 TABLET BY MOUTH TWICE A DAY AS NEEDED FOR ANXIETY 60 tablet 5   bisoprolol (ZEBETA) 5 MG tablet Take 1 tablet (5 mg  total) by mouth daily. 90 tablet 3   budesonide-formoterol (SYMBICORT) 160-4.5 MCG/ACT inhaler Inhale 2 puffs into the lungs in the morning and at bedtime.     dicyclomine (BENTYL) 10 MG capsule Take 1 capsule (10 mg total) by mouth 4 (four) times daily -  before meals and at bedtime. 90 capsule 2   Dupilumab (DUPIXENT) 300 MG/2ML SOPN Inject 300 mg into the skin every 14 (fourteen) days. Loading dose completed in clinic 05/21/23 4 mL 5   famotidine (PEPCID) 40 MG tablet Take 40 mg by mouth daily.     gabapentin (NEURONTIN) 300 MG capsule TAKE 1 CAPSULE BY MOUTH THREE TIMES A DAY 270 capsule 1   hydrocortisone (ANUSOL-HC) 25 MG suppository Place 1 suppository (25 mg total) rectally 2 (two) times daily. 12 suppository 0   montelukast (SINGULAIR) 10 MG tablet Take 1 tablet (10 mg total) by mouth at bedtime. 90 tablet 3   sertraline (ZOLOFT) 100 MG tablet TAKE 2.5 TABLETS BY MOUTH DAILY. 225 tablet 1   No facility-administered medications prior to visit.    Review of Systems  Review of Systems  HENT:  Positive for congestion, facial swelling and sinus pain.    Physical Exam  BP 108/62 (BP Location: Left Arm, Patient Position: Sitting, Cuff Size: Normal)   Pulse 64   Temp 98.3 F (36.8 C) (Oral)   Ht 5' 6.5" (1.689 m)   Wt 178 lb 12.8 oz (81.1 kg)   LMP 06/02/2019 (Approximate)   SpO2 96%   BMI 28.43 kg/m  Physical Exam Constitutional:      Appearance: Normal appearance. [name] not ill-appearing.  HENT:     Head: Normocephalic and atraumatic.  Cardiovascular:     Rate and Rhythm: Normal rate and regular rhythm.  Pulmonary:     Effort: Pulmonary effort is normal.     Breath sounds: Normal breath sounds.  Musculoskeletal:        General: Normal range of motion.  Skin:    General: Skin is warm and dry.  Neurological:     General: No focal deficit present.     Mental Status: [name] alert and oriented to person, place, and time. Mental status is at baseline.  Psychiatric:         Mood and Affect: Mood normal.        Behavior: Behavior normal.        Thought Content: Thought content normal.        Judgment: Judgment normal.      Lab Results:  CBC    Component Value Date/Time   WBC 10.9 (H) 05/12/2023 1521   RBC 4.52 05/12/2023 1521   HGB 14.6 05/12/2023 1521   HGB 14.0 10/23/2017 0920   HCT 42.2 05/12/2023 1521   HCT 40.1 10/23/2017 0920   PLT 217 05/12/2023 1521   PLT 276 10/23/2017 0920   MCV 93.4 05/12/2023 1521   MCV 90 10/23/2017 0920   MCH 32.3 05/12/2023 1521   MCHC 34.6 05/12/2023 1521   RDW 12.6 05/12/2023 1521   RDW 13.2 10/23/2017 0920   LYMPHSABS 2.1 03/08/2023 1133   MONOABS 0.5 03/08/2023 1133   EOSABS 0.3 03/08/2023 1133   BASOSABS 0.1 03/08/2023 1133    BMET    Component Value Date/Time   NA 138 05/12/2023 1521   NA 141 04/13/2022 1620   K 4.5 05/12/2023 1521   CL 104 05/12/2023 1521   CO2 26 05/12/2023 1521   GLUCOSE 88 05/12/2023 1521   BUN 15 05/12/2023 1521   BUN 17 04/13/2022 1620   CREATININE 1.07 (H) 05/12/2023 1521   CALCIUM 9.4 05/12/2023 1521   GFRNONAA >60 05/12/2023 1521   GFRAA >60 06/25/2019 1454    BNP No results found for: "BNP"  ProBNP No results found for: "PROBNP"  Imaging: No results found.   Assessment & Plan:   Cough variant asthma Continue Symbicort 2 puffs morning and evening  Continue Singulair 10mg  at bedtime  Continue Dupixent injections   Recurrent sinusitis Continue loratadine OTC daily  Continue saline rinses 1-2 times daily as needed for sinus congestion Start ipratropium nasal spray as directed Start doxycycline 100mg  twice daily x 7 days for sinusitis symptoms if not better in 3-5 days with nasal spray Check respiratory culture  Refer [facility]- Dr. Irene Pap re: recurrent sinusitis   Follow-up: 2 months with Beth NP   Glenford Bayley, NP 07/01/2023

## 2023-06-24 NOTE — Patient Instructions (Addendum)
  Recommendations: Continue Symbicort 2 puffs morning and evening  Continue Dupixent injections  Continue Singulair 10 mg  Continue loratadine OTC daily  Continue saline rinses 1-2 times daily as needed for sinus congestion Start ipratropium nasal spray as directed Take doxycycline 100mg  twice daily x 7 days for sinusitis symptoms if not better in 3-5 days with nasal spray  Refer [facility]- Dr. Irene Pap re: chronic sinusitis  - Order placed  Follow-up: 2 months with Joanne Sandy NP

## 2023-06-30 ENCOUNTER — Telehealth: Payer: BC Managed Care – PPO | Admitting: Family Medicine

## 2023-06-30 DIAGNOSIS — J019 Acute sinusitis, unspecified: Secondary | ICD-10-CM

## 2023-06-30 MED ORDER — PREDNISONE 20 MG PO TABS: 20.0000 mg | ORAL_TABLET | Freq: Two times a day (BID) | ORAL | 0 refills | Status: AC

## 2023-06-30 NOTE — Progress Notes (Signed)
E-Visit for Sinus Problems  We are sorry that you are not feeling well.  Here is how we plan to help!  Based on what you have shared with me it looks like you have sinusitis.  Sinusitis is inflammation and infection in the sinus cavities of the head.  Based on your presentation I believe you most likely have Acute Bacterial Sinusitis.  This is an infection caused by bacteria and is treated with antibiotics. Continue doxycycline You may use an oral decongestant such as Mucinex D or if you have glaucoma or high blood pressure use plain Mucinex. Saline nasal spray help and can safely be used as often as needed for congestion.  If you develop worsening sinus pain, fever or notice severe headache and vision changes, or if symptoms are not better after completion of antibiotic, please schedule an appointment with a health care provider.    I will also send prednisone.   Sinus infections are not as easily transmitted as other respiratory infection, however we still recommend that you avoid close contact with loved ones, especially the very young and elderly.  Remember to wash your hands thoroughly throughout the day as this is the number one way to prevent the spread of infection!  Home Care: Only take medications as instructed by your medical team. Complete the entire course of an antibiotic. Do not take these medications with alcohol. A steam or ultrasonic humidifier can help congestion.  You can place a towel over your head and breathe in the steam from hot water coming from a faucet. Avoid close contacts especially the very young and the elderly. Cover your mouth when you cough or sneeze. Always remember to wash your hands.  Get Help Right Away If: You develop worsening fever or sinus pain. You develop a severe head ache or visual changes. Your symptoms persist after you have completed your treatment plan.  Make sure you Understand these instructions. Will watch your condition. Will get help  right away if you are not doing well or get worse.  Thank you for choosing an e-visit.  Your e-visit answers were reviewed by a board certified advanced clinical practitioner to complete your personal care plan. Depending upon the condition, your plan could have included both over the counter or prescription medications.  Please review your pharmacy choice. Make sure the pharmacy is open so you can pick up prescription now. If there is a problem, you may contact your provider through [employer] and have the prescription routed to another pharmacy.  Your safety is important to [place]. If you have drug allergies check your prescription carefully.   For the next 24 hours you can use MyChart to ask questions about today's visit, request a non-urgent call back, or ask for a work or school excuse. You will get an email in the next two days asking about your experience. I hope that your e-visit has been valuable and will speed your recovery.    have provided 5 minutes of non face to face time during this encounter for chart review and documentation.

## 2023-07-01 NOTE — Assessment & Plan Note (Addendum)
Continue Symbicort 2 puffs morning and evening  Continue Singulair 10mg  at bedtime  Continue Dupixent injections

## 2023-07-01 NOTE — Assessment & Plan Note (Signed)
Continue loratadine OTC daily  Continue saline rinses 1-2 times daily as needed for sinus congestion Start ipratropium nasal spray as directed Start doxycycline 100mg  twice daily x 7 days for sinusitis symptoms if not better in 3-5 days with nasal spray Check respiratory culture  Refer [facility]- Dr. Irene Pap re: recurrent sinusitis

## 2023-07-07 ENCOUNTER — Telehealth: Payer: BC Managed Care – PPO | Admitting: Family

## 2023-07-07 DIAGNOSIS — J329 Chronic sinusitis, unspecified: Secondary | ICD-10-CM

## 2023-07-07 MED ORDER — PREDNISONE 20 MG PO TABS
40.0000 mg | ORAL_TABLET | Freq: Every day | ORAL | 0 refills | Status: AC
Start: 2023-07-07 — End: 2023-07-12

## 2023-07-07 MED ORDER — AMOXICILLIN-POT CLAVULANATE 875-125 MG PO TABS
1.0000 | ORAL_TABLET | Freq: Two times a day (BID) | ORAL | 0 refills | Status: DC
Start: 2023-07-07 — End: 2023-07-25

## 2023-07-07 NOTE — Progress Notes (Signed)
Virtual Visit Consent   Joanne Molina, you are scheduled for a virtual visit with a [facility] provider today. Just as with appointments in the office, your consent must be obtained to participate. Your consent will be active for this visit and any virtual visit you may have with one of our providers in the next 365 days. If you have a MyChart account, a copy of this consent can be sent to you electronically.  As this is a virtual visit, video technology does not allow for your provider to perform a traditional examination. This may limit your provider's ability to fully assess your condition. If your provider identifies any concerns that need to be evaluated in person or the need to arrange testing (such as labs, EKG, etc.), we will make arrangements to do so. Although advances in technology are sophisticated, we cannot ensure that it will always work on either your end or our end. If the connection with a video visit is poor, the visit may have to be switched to a telephone visit. With either a video or telephone visit, we are not always able to ensure that we have a secure connection.  By engaging in this virtual visit, you consent to the provision of healthcare and authorize for your insurance to be billed (if applicable) for the services provided during this visit. Depending on your insurance coverage, you may receive a charge related to this service.  I need to obtain your verbal consent now. Are you willing to proceed with your visit today? Joanne Molina has provided verbal consent on 07/07/2023 for a virtual visit (video or telephone). Jannifer Rodney, FNP  Date: 07/07/2023 9:17 AM  Virtual Visit via Video Note   I, Jannifer Rodney, connected with  Joanne Molina  ([id number], 12-Jan-1980) on 07/07/23 at  9:15 AM EDT by a video-enabled telemedicine application and verified that I am speaking with the correct person using two identifiers.  Location: Patient: Virtual Visit Location Patient:  Home Provider: Virtual Visit Location Provider: Home Office   I discussed the limitations of evaluation and management by telemedicine and the availability of in person appointments. The patient expressed understanding and agreed to proceed.    History of Present Illness: Joanne Molina is a 43 y.o. who identifies as a female who was assigned female at birth, and is being seen today for sinus issues. She was given doxycyline and ipratropium on 06/24/23. Reports she continued to have sinus pressure and congestion. Did an Evisit on 06/30/23 and was given prednisone. This helped slightly, however, yesterday the pain has worsen. Reports she had to lay in bed yesterday, and using warm compressions.   Has hx of chronic sinusitis. She has an ENT appointment to establish care.   HPI: HPI  Problems:  Patient Active Problem List   Diagnosis Date Noted   Helicobacter pylori gastritis 05/14/2023   Abdominal pain 05/14/2023   Abnormal weight gain 05/14/2023   Abnormal weight loss 05/14/2023   Change in bowel habit 05/14/2023   Diarrhea 05/14/2023   Early satiety 05/14/2023   Epigastric pain 05/14/2023   Flatulence, eructation and gas pain 05/14/2023   Right lower quadrant pain 05/14/2023   Acid reflux 05/14/2023   Gastroesophageal reflux disease 05/14/2023   Recurrent sinusitis 04/30/2023   Acute sinusitis 10/14/2022   Hx of anorexia nervosa 10/14/2022   Chronic rhinitis 09/19/2021   Depression    Acute pain of right shoulder 01/30/2021   Body mass index (BMI) 28.0-28.9, adult 01/02/2021  Elevated blood-pressure reading, without diagnosis of hypertension 11/04/2020   Spinal stenosis, cervical region 09/02/2020   Lumbar back pain with radiculopathy affecting right lower extremity 09/02/2020   Gastropathy 10/05/2019   S/P laparoscopic hysterectomy 06/29/2019   Cough variant asthma 05/14/2019   Essential hypertension 05/14/2019   GAD (generalized anxiety disorder) 10/21/2018   MDD (major  depressive disorder) 10/21/2018   Skin picking habit 10/21/2018   Insomnia 08/07/2018   Anxiety 02/12/2017   Gestational hypertension 07/12/2016   Gestational hypertension w/o significant proteinuria in 3rd trimester 07/12/2016   Urinary frequency 09/09/2012   Biliary colic 08/30/2011   BLURRED VISION 11/22/2007    Allergies:  Allergies  Allergen Reactions   Erythromycin Nausea Only   Medications:  Current Outpatient Medications:    amoxicillin-clavulanate (AUGMENTIN) 875-125 MG tablet, Take 1 tablet by mouth 2 (two) times daily., Disp: 14 tablet, Rfl: 0   predniSONE (DELTASONE) 20 MG tablet, Take 2 tablets (40 mg total) by mouth daily with breakfast for 5 days., Disp: 10 tablet, Rfl: 0   albuterol (VENTOLIN HFA) 108 (90 Base) MCG/ACT inhaler, Inhale 1 puff into the lungs every 6 (six) hours as needed for wheezing or shortness of breath., Disp: 18 g, Rfl: 11   ALPRAZolam (XANAX) 1 MG tablet, TAKE 1 TABLET BY MOUTH TWICE A DAY AS NEEDED FOR ANXIETY, Disp: 60 tablet, Rfl: 5   bisoprolol (ZEBETA) 5 MG tablet, Take 1 tablet (5 mg total) by mouth daily., Disp: 90 tablet, Rfl: 3   budesonide-formoterol (SYMBICORT) 160-4.5 MCG/ACT inhaler, Inhale 2 puffs into the lungs in the morning and at bedtime., Disp: , Rfl:    dicyclomine (BENTYL) 10 MG capsule, Take 1 capsule (10 mg total) by mouth 4 (four) times daily -  before meals and at bedtime., Disp: 90 capsule, Rfl: 2   doxycycline (VIBRA-TABS) 100 MG tablet, Take 1 tablet (100 mg total) by mouth 2 (two) times daily., Disp: 14 tablet, Rfl: 0   Dupilumab (DUPIXENT) 300 MG/2ML SOPN, Inject 300 mg into the skin every 14 (fourteen) days. Loading dose completed in clinic 05/21/23, Disp: 4 mL, Rfl: 5   famotidine (PEPCID) 40 MG tablet, Take 40 mg by mouth daily., Disp: , Rfl:    gabapentin (NEURONTIN) 300 MG capsule, TAKE 1 CAPSULE BY MOUTH THREE TIMES A DAY, Disp: 270 capsule, Rfl: 1   hydrocortisone (ANUSOL-HC) 25 MG suppository, Place 1 suppository  (25 mg total) rectally 2 (two) times daily., Disp: 12 suppository, Rfl: 0   ipratropium (ATROVENT) 0.06 % nasal spray, Place 2 sprays into both nostrils 3 (three) times daily., Disp: 15 mL, Rfl: 1   montelukast (SINGULAIR) 10 MG tablet, Take 1 tablet (10 mg total) by mouth at bedtime., Disp: 90 tablet, Rfl: 3   sertraline (ZOLOFT) 100 MG tablet, TAKE 2.5 TABLETS BY MOUTH DAILY., Disp: 225 tablet, Rfl: 1  Observations/Objective: Patient is well-developed, well-nourished in no acute distress.  Resting comfortably  at home.  Head is normocephalic, atraumatic.  No labored breathing.  Speech is clear and coherent with logical content.  Patient is alert and oriented at baseline.  Sinus pressure   Assessment and Plan: 1. Recurrent sinusitis - amoxicillin-clavulanate (AUGMENTIN) 875-125 MG tablet; Take 1 tablet by mouth 2 (two) times daily.  Dispense: 14 tablet; Refill: 0 - predniSONE (DELTASONE) 20 MG tablet; Take 2 tablets (40 mg total) by mouth daily with breakfast for 5 days.  Dispense: 10 tablet; Refill: 0  - Take meds as prescribed -Start Augmentin and prednisone  - Use a  cool mist humidifier  -Use saline nose sprays frequently -Force fluids -For any cough or congestion  Use plain Mucinex- regular strength or max strength is fine -For fever or aces or pains- take tylenol or ibuprofen. -Throat lozenges if help -New toothbrush in 3 days   Follow Up Instructions: I discussed the assessment and treatment plan with the patient. The patient was provided an opportunity to ask questions and all were answered. The patient agreed with the plan and demonstrated an understanding of the instructions.  A copy of instructions were sent to the patient via MyChart unless otherwise noted below.     The patient was advised to call back or seek an in-person evaluation if the symptoms worsen or if the condition fails to improve as anticipated.  Time:  I spent 6 minutes with the patient via telehealth  technology discussing the above problems/concerns.    Jannifer Rodney, FNP

## 2023-07-24 ENCOUNTER — Encounter: Payer: Self-pay | Admitting: Gastroenterology

## 2023-07-25 ENCOUNTER — Ambulatory Visit (INDEPENDENT_AMBULATORY_CARE_PROVIDER_SITE_OTHER): Payer: BC Managed Care – PPO | Admitting: Otolaryngology

## 2023-07-25 ENCOUNTER — Encounter (INDEPENDENT_AMBULATORY_CARE_PROVIDER_SITE_OTHER): Payer: Self-pay | Admitting: Otolaryngology

## 2023-07-25 VITALS — BP 116/80 | HR 66 | Ht 67.0 in | Wt 168.0 lb

## 2023-07-25 DIAGNOSIS — J3089 Other allergic rhinitis: Secondary | ICD-10-CM

## 2023-07-25 DIAGNOSIS — J45991 Cough variant asthma: Secondary | ICD-10-CM

## 2023-07-25 DIAGNOSIS — J329 Chronic sinusitis, unspecified: Secondary | ICD-10-CM

## 2023-07-25 DIAGNOSIS — R0981 Nasal congestion: Secondary | ICD-10-CM

## 2023-07-25 DIAGNOSIS — R0982 Postnasal drip: Secondary | ICD-10-CM | POA: Diagnosis not present

## 2023-07-25 DIAGNOSIS — J343 Hypertrophy of nasal turbinates: Secondary | ICD-10-CM

## 2023-07-25 DIAGNOSIS — J342 Deviated nasal septum: Secondary | ICD-10-CM

## 2023-07-25 MED ORDER — FLUTICASONE PROPIONATE 50 MCG/ACT NA SUSP: 2.0000 | Freq: Every day | NASAL | 6 refills | Status: DC

## 2023-07-25 MED ORDER — CETIRIZINE HCL 10 MG PO TABS: 10.0000 mg | ORAL_TABLET | Freq: Every day | ORAL | 11 refills | Status: DC

## 2023-07-25 NOTE — Progress Notes (Signed)
ENT CONSULT:  Reason for Consult: chronic sinusitis    HPI: Joanne Molina is an 43 y.o. female with hx of GERD/H. Pylori, hx of asthma on Dupixen here for chronic sinus infection/inflammation since 2016. She has allergy sx in the Fall and she gets asthma exacerbation every Fall after URI which typically turns into a sinus infection that lingers for weeks on end (seeing Pulm). She reports nasal congestion, productive cough facial pain and discomfort along her upper teeth.  She was recently started Dupixent. Also on Symbacort Singulair and albuterol as needed. No hx of FESS or other nasal surgery. She ends up in urgent care or ER, requires multiple courses of abx and steroids every season. She never had allergy shots. She tried Flonase and nasal rinses. She tried Ipratropium. She tried it all x 2 weeks.  She has been on Augmentin (2 weeks ago)/Prednisone, which helped  somewhat but she is already starting to get feeling of facial pressure on the right. She has good sense of smell. She has mucus when she blows her nose and it is thick (whitish yellow).   Records Reviewed:  Hx of ACDF - recent MRI c-spine    IMPRESSION: 1. Interval fusion at C5-6 with improved patency of the spinal canal and neural foramen. 2. Moderate right neural foraminal narrowing at C4-5, mildly progressed when compared to prior MRI. 3. No high-grade spinal canal stenosis at any level. 4. Diffuse paranasal sinus disease with fluid level within the left maxillary sinus, suggesting acute sinusitis.  Max/face 2020 Narrative & Impression  CLINICAL DATA:  43 year old female with persistent cough. Frontal pressure and drainage.   EXAM: CT PARANASAL SINUS LIMITED WITHOUT CONTRAST   TECHNIQUE: Non-contiguous multidetector CT images of the paranasal sinuses were obtained in a single plane without contrast.   COMPARISON:  Head CT 12/04/2007.   FINDINGS: Fluid and bubbly opacity throughout the bilateral paranasal  sinuses.   Mild rightward nasal septal deviation, otherwise negative nasal cavity.   Negative visible orbits soft tissues, postoperative changes to the left globe. Visible noncontrast face soft tissues appear brain negative parenchyma.   Negative visible No acute osseous abnormality identified.   IMPRESSION: Positive for acute sinusitis with pansinus involvement. No complicating features.      Past Medical History:  Diagnosis Date   Anxiety    panic attack   Cough variant asthma 05/14/2019   Depression    Gastropathy 10/05/2019   Migraine    S/P laparoscopic hysterectomy 06/29/2019    Past Surgical History:  Procedure Laterality Date   CHOLECYSTECTOMY  10/02/11   CYSTOSCOPY N/A 06/29/2019   Procedure: CYSTOSCOPY;  Surgeon: Edwinna Areola, DO;  Location: [facility];  Service: Gynecology;  Laterality: N/A;   TOTAL LAPAROSCOPIC HYSTERECTOMY WITH SALPINGECTOMY Bilateral 06/29/2019   Procedure: TOTAL LAPAROSCOPIC HYSTERECTOMY WITH SALPINGECTOMY, poss open;  Surgeon: Edwinna Areola, DO;  Location: [facility];  Service: Gynecology;  Laterality: Bilateral;  smoke evac rep will be here [place]   UPPER GI ENDOSCOPY  2015    Family History  Problem Relation Age of Onset   Diabetes Mother    Hyperlipidemia Mother    Heart disease Mother    Asthma Mother    Allergies Mother    Stomach cancer Mother    Diabetes Father    Hyperlipidemia Father    Heart disease Father    Cancer Maternal Grandmother        colon and breast   Cancer Paternal Grandmother  colon    Social History:  reports that she has never smoked. She has been exposed to tobacco smoke. She has never used smokeless tobacco. She reports that she does not drink alcohol and does not use drugs.  Allergies:  Allergies  Allergen Reactions   Erythromycin Nausea Only    Medications: I have reviewed the patient's current medications.  The PMH, PSH, Medications,  Allergies, and SH were reviewed and updated.  ROS: Constitutional: Negative for fever, weight loss and weight gain. Cardiovascular: Negative for chest pain and dyspnea on exertion. Respiratory: Is not experiencing shortness of breath at rest. Gastrointestinal: Negative for nausea and vomiting. Neurological: Negative for headaches. Psychiatric: The patient is not nervous/anxious  Blood pressure 116/80, pulse 66, height 5\' 7"  (1.702 m), weight 168 lb (76.2 kg), last menstrual period 06/02/2019, SpO2 99%, unknown if currently breastfeeding.  PHYSICAL EXAM:  Exam: General: Well-developed, well-nourished Communication and Voice: Clear pitch and clarity Respiratory Respiratory effort: Equal inspiration and expiration without stridor Cardiovascular Peripheral Vascular: Warm extremities with equal color/perfusion Eyes: No nystagmus with equal extraocular motion bilaterally Neuro/Psych/Balance: Patient oriented to person, place, and time; Appropriate mood and affect; Gait is intact with no imbalance; Cranial nerves I-XII are intact Head and Face Inspection: Normocephalic and atraumatic without mass or lesion Palpation: Facial skeleton intact without bony stepoffs Salivary Glands: No mass or tenderness Facial Strength: Facial motility symmetric and full bilaterally ENT Pinna: External ear intact and fully developed External canal: Canal is patent with intact skin Tympanic Membrane: Clear and mobile External Nose: No scar or anatomic deformity Internal Nose: see nasal endoscopy procedure note  Lips, Teeth, and gums: Mucosa and teeth intact and viable TMJ: No pain to palpation with full mobility Oral cavity/oropharynx: No erythema or exudate, no lesions present Nasopharynx: No mass or lesion with intact mucosa Neck Neck and Trachea: Midline trachea without mass or lesion Thyroid: No mass or nodularity Lymphatics: No lymphadenopathy  Procedure:   PROCEDURE NOTE: nasal  endoscopy  Preoperative diagnosis: chronic sinusitis symptoms  Postoperative diagnosis: same + ITH and left sided NSD  Procedure: Diagnostic nasal endoscopy (16109)  Surgeon: Ashok Croon, M.D.  Anesthesia: Topical lidocaine and Afrin  H&P REVIEW: The patient's history and physical were reviewed today prior to procedure. All medications were reviewed and updated as well. Complications: None Condition is stable throughout exam Indications and consent: The patient presents with symptoms of chronic sinusitis not responding to previous therapies. All the risks, benefits, and potential complications were reviewed with the patient preoperatively and informed consent was obtained. The time out was completed with confirmation of the correct procedure.   Procedure: The patient was seated upright in the clinic. Topical lidocaine and Afrin were applied to the nasal cavity. After adequate anesthesia had occurred, the rigid nasal endoscope was passed into the nasal cavity. The nasal mucosa, turbinates, septum, and sinus drainage pathways were visualized bilaterally. This revealed no purulence or significant secretions that might be cultured. There was a polypoid change vs polyp in the right middle meatus. The mucosa was intact and there was no crusting present. There was evidence of septal deviation to the left and bilateral inferior turbinate hypertrophy. The scope was then slowly withdrawn and the patient tolerated the procedure well. There were no complications or blood loss.  Studies Reviewed:CXR 09/14/2023 Unremarkable CXR - lungs clear and hear size is normal  CT A/P - for abdominal pain/appetite issues  IMPRESSION: 1. No acute findings in the abdomen pelvis. 2. Appendix not identified but no pericecal  inflammation to suggest appendicitis. 3. Post cholecystectomy and hysterectomy.  Assessment/Plan: Encounter Diagnoses  Name Primary?   Chronic sinusitis, unspecified location Yes   Nasal  congestion [R09.81]    Environmental and seasonal allergies [J30.89]    Post-nasal drainage [R09.82]    Nasal septal deviation    Hypertrophy of inferior nasal turbinate    Cough variant asthma   Clinical hx c/w with recurrent vs chronic rhino-sinusitis in the setting of seasonal vs environmental allergies, also has evidence of NSD, and a single polyp on the left side near middle meatus/ITH on bilateral nasal endoscopy, but no purulence or features of CRSwNP.   - nasal saline rinses  - start Flonase and Zyrtec  - schedule CT sinuses  - return after CT sinuses  - continue with Dupixent and rest of your medications started by Pulmonary - Will consider allergy testing in the future  - Will consider steroid rinses in the future   Thank you for allowing me to participate in the care of this patient. Please do not hesitate to contact me with any questions or concerns.   Ashok Croon, MD Otolaryngology [facility] Phone: [phone] Fax: [phone]    07/25/2023, 7:06 PM

## 2023-07-25 NOTE — Patient Instructions (Addendum)
-   nasal saline rinses  - start Flonase and Zyrtec  - schedule CT sinuses  - return after CT sinuses  - continue with Dupixen and rest of your medications

## 2023-07-31 ENCOUNTER — Encounter
Admission: RE | Disposition: A | Discharge status: Home / Self Care | Payer: Self-pay | Source: Home / Self Care | Attending: Gastroenterology

## 2023-07-31 ENCOUNTER — Encounter: Payer: Self-pay | Admitting: Gastroenterology

## 2023-07-31 ENCOUNTER — Ambulatory Visit
Admission: RE | Admit: 2023-07-31 | Discharge: 2023-07-31 | Disposition: A | Discharge status: Home / Self Care | Payer: BC Managed Care – PPO | Attending: Gastroenterology | Admitting: Gastroenterology

## 2023-07-31 ENCOUNTER — Ambulatory Visit: Payer: BC Managed Care – PPO | Admitting: Anesthesiology

## 2023-07-31 DIAGNOSIS — F32A Depression, unspecified: Secondary | ICD-10-CM | POA: Diagnosis not present

## 2023-07-31 DIAGNOSIS — F410 Panic disorder [episodic paroxysmal anxiety] without agoraphobia: Secondary | ICD-10-CM | POA: Insufficient documentation

## 2023-07-31 DIAGNOSIS — R1011 Right upper quadrant pain: Secondary | ICD-10-CM | POA: Insufficient documentation

## 2023-07-31 DIAGNOSIS — R197 Diarrhea, unspecified: Secondary | ICD-10-CM | POA: Insufficient documentation

## 2023-07-31 DIAGNOSIS — R198 Other specified symptoms and signs involving the digestive system and abdomen: Secondary | ICD-10-CM | POA: Diagnosis not present

## 2023-07-31 DIAGNOSIS — J45909 Unspecified asthma, uncomplicated: Secondary | ICD-10-CM | POA: Insufficient documentation

## 2023-07-31 DIAGNOSIS — R140 Abdominal distension (gaseous): Secondary | ICD-10-CM | POA: Insufficient documentation

## 2023-07-31 DIAGNOSIS — G8929 Other chronic pain: Secondary | ICD-10-CM

## 2023-07-31 DIAGNOSIS — K5900 Constipation, unspecified: Secondary | ICD-10-CM | POA: Diagnosis not present

## 2023-07-31 DIAGNOSIS — Z1211 Encounter for screening for malignant neoplasm of colon: Secondary | ICD-10-CM

## 2023-07-31 HISTORY — PX: ESOPHAGOGASTRODUODENOSCOPY (EGD) WITH PROPOFOL: SHX5813

## 2023-07-31 HISTORY — PX: COLONOSCOPY WITH PROPOFOL: SHX5780

## 2023-07-31 HISTORY — PX: BIOPSY: SHX5522

## 2023-07-31 SURGERY — COLONOSCOPY WITH PROPOFOL
Anesthesia: General

## 2023-07-31 MED ORDER — DEXMEDETOMIDINE HCL IN NACL 80 MCG/20ML IV SOLN
INTRAVENOUS | Status: DC | PRN
  Administered 2023-07-31: 8 ug via INTRAVENOUS

## 2023-07-31 MED ORDER — SODIUM CHLORIDE 0.9 % IV SOLN
INTRAVENOUS | Status: DC
  Administered 2023-07-31: 1000 mL via INTRAVENOUS

## 2023-07-31 MED ORDER — LIDOCAINE HCL (CARDIAC) PF 100 MG/5ML IV SOSY
PREFILLED_SYRINGE | INTRAVENOUS | Status: DC | PRN
  Administered 2023-07-31: 40 mg via INTRAVENOUS

## 2023-07-31 MED ORDER — PROPOFOL 10 MG/ML IV BOLUS
INTRAVENOUS | Status: DC | PRN
  Administered 2023-07-31: 125 ug/kg/min via INTRAVENOUS
  Administered 2023-07-31: 70 mg via INTRAVENOUS
  Administered 2023-07-31: 30 mg via INTRAVENOUS

## 2023-07-31 NOTE — Anesthesia Preprocedure Evaluation (Signed)
Anesthesia Evaluation  Patient identified by MRN, date of birth, ID band Patient awake    Reviewed: Allergy & Precautions, H&P , NPO status , Patient's Chart, lab work & pertinent test results, reviewed documented beta blocker date and time   History of Anesthesia Complications Negative for: history of anesthetic complications  Airway Mallampati: I  TM Distance: >3 FB Neck ROM: full    Dental  (+) Dental Advidsory Given, Teeth Intact   Pulmonary neg shortness of breath, asthma , Continuous Positive Airway Pressure Ventilation , neg COPD, neg recent URI   Pulmonary exam normal breath sounds clear to auscultation       Cardiovascular Exercise Tolerance: Good negative cardio ROS Normal cardiovascular exam Rhythm:regular Rate:Normal     Neuro/Psych  PSYCHIATRIC DISORDERS Anxiety Depression    negative neurological ROS     GI/Hepatic negative GI ROS, Neg liver ROS,,,  Endo/Other  negative endocrine ROS    Renal/GU negative Renal ROS  negative genitourinary   Musculoskeletal   Abdominal   Peds  Hematology negative hematology ROS (+)   Anesthesia Other Findings Past Medical History: No date: Anxiety     Comment:  panic attack 05/14/2019: Cough variant asthma No date: Depression 10/05/2019: Gastropathy No date: Migraine 06/29/2019: S/P laparoscopic hysterectomy   Reproductive/Obstetrics negative OB ROS                             Anesthesia Physical Anesthesia Plan  ASA: 2  Anesthesia Plan: General   Post-op Pain Management:    Induction: Intravenous  PONV Risk Score and Plan: 3 and Propofol infusion and TIVA  Airway Management Planned: Natural Airway and Nasal Cannula  Additional Equipment:   Intra-op Plan:   Post-operative Plan:   Informed Consent: I have reviewed the patients History and Physical, chart, labs and discussed the procedure including the risks, benefits and  alternatives for the proposed anesthesia with the patient or authorized representative who has indicated his/her understanding and acceptance.     Dental Advisory Given  Plan Discussed with: Anesthesiologist, CRNA and Surgeon  Anesthesia Plan Comments:         Anesthesia Quick Evaluation

## 2023-07-31 NOTE — Op Note (Signed)
[facility] Gastroenterology Patient Name: Joanne Molina Procedure Date: 07/31/2023 8:55 AM MRN: [id number] Account #: [id number] Date of Birth: [birth year] Admit Type: Outpatient Age: 43 Room: [facility] ENDO ROOM 4 Gender: Female Note Status: Finalized Instrument Name: Colonscope [id number] Procedure:             Colonoscopy Indications:           This is the patient's first colonoscopy, Change in                         bowel habits Providers:             Toney Reil MD, MD Referring MD:          Ruby Cola. Cobb (Referring MD) Medicines:             General Anesthesia Complications:         No immediate complications. Estimated blood loss: None. Procedure:             Pre-Anesthesia Assessment:                        - Prior to the procedure, a History and Physical was                         performed, and patient medications and allergies were                         reviewed. The patient is competent. The risks and                         benefits of the procedure and the sedation options and                         risks were discussed with the patient. All questions                         were answered and informed consent was obtained.                         Patient identification and proposed procedure were                         verified by the physician, the nurse, the                         anesthesiologist, the anesthetist and the technician                         in the pre-procedure area in the procedure room in the                         endoscopy suite. Mental Status Examination: alert and                         oriented. Airway Examination: normal oropharyngeal                         airway and neck mobility. Respiratory Examination:  clear to auscultation. CV Examination: normal.                         Prophylactic Antibiotics: The patient does not require                         prophylactic antibiotics. Prior  Anticoagulants: The                         patient has taken no anticoagulant or antiplatelet                         agents. ASA Grade Assessment: II - A patient with mild                         systemic disease. After reviewing the risks and                         benefits, the patient was deemed in satisfactory                         condition to undergo the procedure. The anesthesia                         plan was to use general anesthesia. Immediately prior                         to administration of medications, the patient was                         re-assessed for adequacy to receive sedatives. The                         heart rate, respiratory rate, oxygen saturations,                         blood pressure, adequacy of pulmonary ventilation, and                         response to care were monitored throughout the                         procedure. The physical status of the patient was                         re-assessed after the procedure.                        After obtaining informed consent, the colonoscope was                         passed under direct vision. Throughout the procedure,                         the patient's blood pressure, pulse, and oxygen                         saturations were monitored continuously. The  Colonoscope was introduced through the anus and                         advanced to the the terminal ileum, with                         identification of the appendiceal orifice and IC                         valve. The colonoscopy was performed without                         difficulty. The patient tolerated the procedure well.                         The quality of the bowel preparation was evaluated                         using the BBPS [facility] Bowel Preparation Scale) with                         scores of: Right Colon = 3, Transverse Colon = 3 and                         Left Colon = 3 (entire mucosa seen well  with no                         residual staining, small fragments of stool or opaque                         liquid). The total BBPS score equals 9. The terminal                         ileum, ileocecal valve, appendiceal orifice, and                         rectum were photographed. Findings:      The perianal and digital rectal examinations were normal. Pertinent       negatives include normal sphincter tone and no palpable rectal lesions.      The terminal ileum appeared normal.      The entire examined colon appeared normal.      The retroflexed view of the distal rectum and anal verge was normal and       showed no anal or rectal abnormalities. Impression:            - The examined portion of the ileum was normal.                        - The entire examined colon is normal.                        - The distal rectum and anal verge are normal on                         retroflexion view.                        -  No specimens collected. Recommendation:        - Discharge patient to home (with escort).                        - Resume previous diet today.                        - Continue present medications.                        - Repeat colonoscopy in 10 years for screening                         purposes. Procedure Code(s):     --- Professional ---                        [phone], Colonoscopy, flexible; diagnostic, including                         collection of specimen(s) by brushing or washing, when                         performed (separate procedure) Diagnosis Code(s):     --- Professional ---                        R19.4, Change in bowel habit CPT copyright 2022 American Medical Association. All rights reserved. The codes documented in this report are preliminary and upon coder review may  be revised to meet current compliance requirements. Dr. Libby Maw Toney Reil MD, MD 07/31/2023 9:37:55 AM This report has been signed electronically. Number of Addenda:  0 Note Initiated On: 07/31/2023 8:55 AM Scope Withdrawal Time: 0 hours 6 minutes 53 seconds  Total Procedure Duration: 0 hours 12 minutes 14 seconds  Estimated Blood Loss:  Estimated blood loss: none.      [facility]

## 2023-07-31 NOTE — H&P (Signed)
Arlyss Repress, MD [address]  [address]  [place], [place] [address]  Main: [phone]  Fax: [phone] Pager: [phone]  Primary Care Physician:  Shirline Frees, NP Primary Gastroenterologist:  Dr. Arlyss Repress  Pre-Procedure History & Physical: HPI:  Joanne Molina is a 43 y.o. female is here for an endoscopy and colonoscopy.   Past Medical History:  Diagnosis Date   Anxiety    panic attack   Cough variant asthma 05/14/2019   Depression    Gastropathy 10/05/2019   Migraine    S/P laparoscopic hysterectomy 06/29/2019    Past Surgical History:  Procedure Laterality Date   ABDOMINAL HYSTERECTOMY     CHOLECYSTECTOMY  10/02/2011   CYSTOSCOPY N/A 06/29/2019   Procedure: CYSTOSCOPY;  Surgeon: Edwinna Areola, DO;  Location: [facility];  Service: Gynecology;  Laterality: N/A;   TOTAL LAPAROSCOPIC HYSTERECTOMY WITH SALPINGECTOMY Bilateral 06/29/2019   Procedure: TOTAL LAPAROSCOPIC HYSTERECTOMY WITH SALPINGECTOMY, poss open;  Surgeon: Edwinna Areola, DO;  Location: [facility];  Service: Gynecology;  Laterality: Bilateral;  smoke evac rep will be here [place]   UPPER GI ENDOSCOPY  2015    Prior to Admission medications   Medication Sig Start Date End Date Taking? Authorizing Provider  ALPRAZolam Prudy Feeler) 1 MG tablet TAKE 1 TABLET BY MOUTH TWICE A DAY AS NEEDED FOR ANXIETY 03/01/23  Yes Hurst, Teresa T, PA-C  bisoprolol (ZEBETA) 5 MG tablet Take 1 tablet (5 mg total) by mouth daily. 04/30/23  Yes Glenford Bayley, NP  budesonide-formoterol [facility]) 160-4.5 MCG/ACT inhaler Inhale 2 puffs into the lungs in the morning and at bedtime. 05/13/23  Yes [provider]  cetirizine (ZYRTEC) 10 MG tablet Take 1 tablet (10 mg total) by mouth daily. 07/25/23  Yes Soldatova, Eusebio Friendly, MD  Dupilumab (DUPIXENT) 300 MG/2ML SOPN Inject 300 mg into the skin every 14 (fourteen) days. Loading dose completed in clinic 05/21/23 05/21/23  Yes  Glenford Bayley, NP  famotidine (PEPCID) 40 MG tablet Take 40 mg by mouth daily.   Yes [provider]  fluticasone (FLONASE) 50 MCG/ACT nasal spray Place 2 sprays into both nostrils daily. 07/25/23  Yes Ashok Croon, MD  gabapentin (NEURONTIN) 300 MG capsule TAKE 1 CAPSULE BY MOUTH THREE TIMES A DAY 03/01/23  Yes Hurst, Teresa T, PA-C  montelukast (SINGULAIR) 10 MG tablet Take 1 tablet (10 mg total) by mouth at bedtime. 04/30/23  Yes Glenford Bayley, NP  sertraline (ZOLOFT) 100 MG tablet TAKE 2.5 TABLETS BY MOUTH DAILY. 03/01/23  Yes Hurst, Glade Nurse, PA-C  albuterol (VENTOLIN HFA) 108 (90 Base) MCG/ACT inhaler Inhale 1 puff into the lungs every 6 (six) hours as needed for wheezing or shortness of breath. 09/21/22   Glenford Bayley, NP  dicyclomine (BENTYL) 10 MG capsule Take 1 capsule (10 mg total) by mouth 4 (four) times daily -  before meals and at bedtime. 06/06/23 09/04/23  Celso Amy, PA-C  doxycycline (VIBRA-TABS) 100 MG tablet Take 1 tablet (100 mg total) by mouth 2 (two) times daily. Patient not taking: Reported on 07/31/2023 06/24/23   Glenford Bayley, NP  hydrocortisone (ANUSOL-HC) 25 MG suppository Place 1 suppository (25 mg total) rectally 2 (two) times daily. Patient not taking: Reported on 07/31/2023 05/21/23   Margaretann Loveless, PA-C    Allergies as of 06/06/2023 - Review Complete 06/06/2023  Allergen Reaction Noted   Erythromycin Nausea Only     Family History  Problem Relation Age of Onset  Diabetes Mother    Hyperlipidemia Mother    Heart disease Mother    Asthma Mother    Allergies Mother    Stomach cancer Mother    Diabetes Father    Hyperlipidemia Father    Heart disease Father    Cancer Maternal Grandmother        colon and breast   Cancer Paternal Grandmother        colon    Social History   Socioeconomic History   Marital status: Married    Spouse name: Not on file   Number of children: Not on file   Years of education: Not on file    Highest education level: Master's degree (e.g., MA, MS, MEng, MEd, MSW, MBA)  Occupational History   Not on file  Tobacco Use   Smoking status: Never    Passive exposure: Past   Smokeless tobacco: Never  Vaping Use   Vaping status: Never Used  Substance and Sexual Activity   Alcohol use: No   Drug use: No   Sexual activity: Yes    Birth control/protection: Diaphragm  Other Topics Concern   Not on file  Social History Narrative   Not on file   Social Determinants of Health   Financial Resource Strain: Low Risk  (02/01/2023)   Overall Financial Resource Strain (CARDIA)    Difficulty of Paying Living Expenses: Not very hard  Food Insecurity: No Food Insecurity (02/01/2023)   Hunger Vital Sign    Worried About Running Out of Food in the Last Year: Never true    Ran Out of Food in the Last Year: Never true  Transportation Needs: No Transportation Needs (02/01/2023)   PRAPARE - Administrator, Civil Service (Medical): No    Lack of Transportation (Non-Medical): No  Physical Activity: Insufficiently Active (02/01/2023)   Exercise Vital Sign    Days of Exercise per Week: 4 days    Minutes of Exercise per Session: 20 min  Stress: Stress Concern Present (02/01/2023)   [employer] of Occupational Health - Occupational Stress Questionnaire    Feeling of Stress : Very much  Social Connections: Socially Integrated (02/01/2023)   Social Connection and Isolation Panel [NHANES]    Frequency of Communication with Friends and Family: More than three times a week    Frequency of Social Gatherings with Friends and Family: More than three times a week    Attends Religious Services: More than 4 times per year    Active Member of [employer] or Organizations: Yes    Attends Engineer, structural: More than 4 times per year    Marital Status: Married  Catering manager Violence: Not on file    Review of Systems: See HPI, otherwise negative ROS  Physical Exam: BP 118/84   Pulse  83   Temp (!) 96.9 F (36.1 C) (Temporal)   Resp 18   Ht 5\' 7"  (1.702 m)   Wt 79.5 kg   LMP 06/02/2019 (Approximate)   SpO2 100%   BMI 27.46 kg/m  General:   Alert,  pleasant and cooperative in NAD Head:  Normocephalic and atraumatic. Neck:  Supple; no masses or thyromegaly. Lungs:  Clear throughout to auscultation.    Heart:  Regular rate and rhythm. Abdomen:  Soft, nontender and nondistended. Normal bowel sounds, without guarding, and without rebound.   Neurologic:  Alert and  oriented x4;  grossly normal neurologically.  Impression/Plan: Joanne Molina is here for an endoscopy and colonoscopy to be  performed for  Chronic abdominal pain, bloating, constipation, diarrhea   Risks, benefits, limitations, and alternatives regarding upper  endoscopy and colonoscopy have been reviewed with the patient.  Questions have been answered.  All parties agreeable.   Lannette Donath, MD  07/31/2023, 8:40 AM

## 2023-07-31 NOTE — Op Note (Signed)
[facility] Gastroenterology Patient Name: Joanne Molina Procedure Date: 07/31/2023 8:56 AM MRN: [id number] Account #: [id number] Date of Birth: 21-Aug-1980 Admit Type: Ambulatory Age: 43 Room: [facility] ENDO ROOM 4 Gender: Female Note Status: Finalized Instrument Name: Patton Salles Endoscope [id number] Procedure:             Upper GI endoscopy Indications:           Abdominal pain in the right upper quadrant Providers:             Toney Reil MD, MD Referring MD:          Ruby Cola. Cobb (Referring MD) Medicines:             General Anesthesia Complications:         No immediate complications. Estimated blood loss: None. Procedure:             Pre-Anesthesia Assessment:                        - Prior to the procedure, a History and Physical was                         performed, and patient medications and allergies were                         reviewed. The patient is competent. The risks and                         benefits of the procedure and the sedation options and                         risks were discussed with the patient. All questions                         were answered and informed consent was obtained.                         Patient identification and proposed procedure were                         verified by the physician, the nurse, the                         anesthesiologist, the anesthetist and the technician                         in the pre-procedure area in the procedure room in the                         endoscopy suite. Mental Status Examination: alert and                         oriented. Airway Examination: normal oropharyngeal                         airway and neck mobility. Respiratory Examination:                         clear to auscultation. CV Examination: normal.  Prophylactic Antibiotics: The patient does not require                         prophylactic antibiotics. Prior Anticoagulants: The                          patient has taken no anticoagulant or antiplatelet                         agents. ASA Grade Assessment: II - A patient with mild                         systemic disease. After reviewing the risks and                         benefits, the patient was deemed in satisfactory                         condition to undergo the procedure. The anesthesia                         plan was to use general anesthesia. Immediately prior                         to administration of medications, the patient was                         re-assessed for adequacy to receive sedatives. The                         heart rate, respiratory rate, oxygen saturations,                         blood pressure, adequacy of pulmonary ventilation, and                         response to care were monitored throughout the                         procedure. The physical status of the patient was                         re-assessed after the procedure.                        After obtaining informed consent, the endoscope was                         passed under direct vision. Throughout the procedure,                         the patient's blood pressure, pulse, and oxygen                         saturations were monitored continuously. The Endoscope                         was introduced through the mouth, and advanced to the  third part of duodenum. The upper GI endoscopy was                         accomplished without difficulty. The patient tolerated                         the procedure well. Findings:      The duodenal bulb and examined duodenum were normal. Biopsies were taken       with a cold forceps for histology.      The entire examined stomach was normal. Biopsies were taken with a cold       forceps for histology.      The cardia and gastric fundus were normal on retroflexion.      The gastroesophageal junction and examined esophagus were normal. Impression:            - Normal  duodenal bulb and examined duodenum. Biopsied.                        - Normal stomach. Biopsied.                        - Normal gastroesophageal junction and esophagus. Recommendation:        - Await pathology results.                        - Proceed with colonoscopy as scheduled                        See colonoscopy report Procedure Code(s):     --- Professional ---                        [id number], Esophagogastroduodenoscopy, flexible,                         transoral; with biopsy, single or multiple Diagnosis Code(s):     --- Professional ---                        R10.11, Right upper quadrant pain CPT copyright 2022 American Medical Association. All rights reserved. The codes documented in this report are preliminary and upon coder review may  be revised to meet current compliance requirements. Dr. Libby Maw Toney Reil MD, MD 07/31/2023 9:21:19 AM This report has been signed electronically. Number of Addenda: 0 Note Initiated On: 07/31/2023 8:56 AM Estimated Blood Loss:  Estimated blood loss: none.      [facility]

## 2023-07-31 NOTE — Transfer of Care (Signed)
Immediate Anesthesia Transfer of Care Note  Patient: Joanne Molina  Procedure(s) Performed: COLONOSCOPY WITH PROPOFOL ESOPHAGOGASTRODUODENOSCOPY (EGD) WITH PROPOFOL BIOPSY  Patient Location: PACU  Anesthesia Type:General  Level of Consciousness: awake, alert , and oriented  Airway & Oxygen Therapy: Patient Spontanous Breathing  Post-op Assessment: Report given to RN and Post -op Vital signs reviewed and stable  Post vital signs: Reviewed and stable  Last Vitals:  Vitals Value Taken Time  BP 102/75 07/31/23 0941  Temp    Pulse 76 07/31/23 0942  Resp 14 07/31/23 0942  SpO2 100 % 07/31/23 0942  Vitals shown include unfiled device data.  Last Pain:  Vitals:   07/31/23 0941  TempSrc:   PainSc: 0-No pain      Patients Stated Pain Goal: 0 (07/31/23 2440)  Complications: No notable events documented.

## 2023-08-01 ENCOUNTER — Encounter: Payer: Self-pay | Admitting: Gastroenterology

## 2023-08-01 NOTE — Anesthesia Postprocedure Evaluation (Signed)
Anesthesia Post Note  Patient: [name]  Procedure(s) Performed: COLONOSCOPY WITH PROPOFOL ESOPHAGOGASTRODUODENOSCOPY (EGD) WITH PROPOFOL BIOPSY  Patient location during evaluation: Endoscopy Anesthesia Type: General Level of consciousness: awake and alert Pain management: pain level controlled Vital Signs Assessment: post-procedure vital signs reviewed and stable Respiratory status: spontaneous breathing, nonlabored ventilation, respiratory function stable and patient connected to nasal cannula oxygen Cardiovascular status: blood pressure returned to baseline and stable Postop Assessment: no apparent nausea or vomiting Anesthetic complications: no   No notable events documented.   Last Vitals:  Vitals:   07/31/23 0951 07/31/23 1000  BP: 111/73 111/75  Pulse: 70 65  Resp: 17 17  Temp:    SpO2: 99% 100%    Last Pain:  Vitals:   08/01/23 0737  TempSrc:   PainSc: 0-No pain                 Lenard Simmer

## 2023-08-04 ENCOUNTER — Encounter: Payer: Self-pay | Admitting: Gastroenterology

## 2023-08-05 ENCOUNTER — Telehealth: Payer: Self-pay

## 2023-08-05 NOTE — Telephone Encounter (Signed)
Patient notified normal pathology results.

## 2023-08-13 ENCOUNTER — Other Ambulatory Visit: Payer: Self-pay

## 2023-08-19 ENCOUNTER — Ambulatory Visit: Payer: BC Managed Care – PPO | Admitting: Primary Care

## 2023-08-28 ENCOUNTER — Telehealth: Payer: Self-pay

## 2023-08-28 NOTE — Telephone Encounter (Signed)
Called [facility], spoke with Christell Constant CPT code [phone] authorized [phone] good from 08/28/23-09/26/2023

## 2023-09-04 ENCOUNTER — Ambulatory Visit: Payer: BC Managed Care – PPO | Admitting: Physician Assistant

## 2023-09-06 ENCOUNTER — Telehealth (INDEPENDENT_AMBULATORY_CARE_PROVIDER_SITE_OTHER): Payer: BC Managed Care – PPO | Admitting: Physician Assistant

## 2023-09-06 ENCOUNTER — Encounter: Payer: Self-pay | Admitting: Physician Assistant

## 2023-09-06 DIAGNOSIS — F410 Panic disorder [episodic paroxysmal anxiety] without agoraphobia: Secondary | ICD-10-CM

## 2023-09-06 DIAGNOSIS — F411 Generalized anxiety disorder: Secondary | ICD-10-CM

## 2023-09-06 DIAGNOSIS — F331 Major depressive disorder, recurrent, moderate: Secondary | ICD-10-CM

## 2023-09-06 DIAGNOSIS — G4700 Insomnia, unspecified: Secondary | ICD-10-CM

## 2023-09-06 MED ORDER — ALPRAZOLAM 1 MG PO TABS: ORAL_TABLET | ORAL | 5 refills | Status: DC

## 2023-09-06 MED ORDER — SERTRALINE HCL 100 MG PO TABS: 300.0000 mg | ORAL_TABLET | Freq: Every day | ORAL | 1 refills | Status: DC

## 2023-09-06 NOTE — Progress Notes (Unsigned)
[facility] Med Check  Patient ID: Joanne Molina,  MRN: [id number]  PCP: Shirline Frees, NP  Date of Evaluation: 09/06/2023 Time spent:20 minutes  Chief Complaint:   Virtual Visit via Telehealth  I connected with patient by a video enabled telemedicine application with their informed consent, and verified patient privacy and that I am speaking with the correct person using two identifiers.  I am private, in my office and the patient is at home.  I discussed the limitations, risks, security and privacy concerns of performing an evaluation and management service by video and the availability of in person appointments. I also discussed with the patient that there may be a patient responsible charge related to this service. The patient expressed understanding and agreed to proceed.   I discussed the assessment and treatment plan with the patient. The patient was provided an opportunity to ask questions and all were answered. The patient agreed with the plan and demonstrated an understanding of the instructions.   The patient was advised to call back or seek an in-person evaluation if the symptoms worsen or if the condition fails to improve as anticipated.  I provided 20 minutes of non-face-to-face time during this encounter.  HISTORY/CURRENT STATUS: For routine 6 month med check.   More stress   Denies dizziness, syncope, seizures, numbness, tingling, tremor, tics, unsteady gait, slurred speech, confusion. Denies muscle or joint pain, stiffness, or dystonia.  Individual Medical History/ Review of Systems: Changes? :Yes   started new med for asthma  Past medications for mental health diagnoses include: Sonata, Paxil, Celexa, Effexor, BuSpar made the anxiety worse, Ativan, Klonopin, Prozac, Xanax, Ambien, Lunesta, trazodone, Propranolol for anxiety changed to bisoprolol b/c her hx of asthma  Allergies: Erythromycin  Current Medications:  Current Outpatient Medications:     albuterol (VENTOLIN HFA) 108 (90 Base) MCG/ACT inhaler, Inhale 1 puff into the lungs every 6 (six) hours as needed for wheezing or shortness of breath., Disp: 18 g, Rfl: 11   ALPRAZolam (XANAX) 1 MG tablet, TAKE 1 TABLET BY MOUTH TWICE A DAY AS NEEDED FOR ANXIETY, Disp: 60 tablet, Rfl: 5   Azelastine HCl 137 MCG/SPRAY SOLN, Place 2 sprays into both nostrils 2 (two) times daily., Disp: , Rfl:    bisoprolol (ZEBETA) 5 MG tablet, Take 1 tablet (5 mg total) by mouth daily., Disp: 90 tablet, Rfl: 3   budesonide-formoterol (SYMBICORT) 160-4.5 MCG/ACT inhaler, Inhale 2 puffs into the lungs in the morning and at bedtime., Disp: , Rfl:    cetirizine (ZYRTEC) 10 MG tablet, Take 1 tablet (10 mg total) by mouth daily., Disp: 30 tablet, Rfl: 11   dicyclomine (BENTYL) 10 MG capsule, Take 1 capsule (10 mg total) by mouth 4 (four) times daily -  before meals and at bedtime., Disp: 90 capsule, Rfl: 2   Dupilumab (DUPIXENT) 300 MG/2ML SOPN, Inject 300 mg into the skin every 14 (fourteen) days. Loading dose completed in clinic 05/21/23, Disp: 4 mL, Rfl: 5   famotidine (PEPCID) 40 MG tablet, Take 40 mg by mouth daily., Disp: , Rfl:    fluticasone (FLONASE) 50 MCG/ACT nasal spray, Place 2 sprays into both nostrils daily., Disp: 16 g, Rfl: 6   gabapentin (NEURONTIN) 300 MG capsule, TAKE 1 CAPSULE BY MOUTH THREE TIMES A DAY, Disp: 270 capsule, Rfl: 1   montelukast (SINGULAIR) 10 MG tablet, Take 1 tablet (10 mg total) by mouth at bedtime., Disp: 90 tablet, Rfl: 3   polyethylene glycol-electrolytes (NULYTELY) 420 g solution, Take 4,000 mLs by mouth  once., Disp: , Rfl:    sertraline (ZOLOFT) 100 MG tablet, TAKE 2.5 TABLETS BY MOUTH DAILY., Disp: 225 tablet, Rfl: 1 Medication Side Effects: none  Family Medical/ Social History: Changes?  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 Her family lives with her Mom.  MENTAL HEALTH EXAM:  Last menstrual period 06/02/2019, unknown if currently  breastfeeding.There is no height or weight on file to calculate BMI.  General Appearance: Casual, Neat and Well Groomed  Eye Contact:  Good  Speech:  Clear and Coherent and Normal Rate  Volume:  Normal  Mood:  Euthymic  Affect:  Congruent   Thought Process:  Goal Directed and Descriptions of Associations: Circumstantial  Orientation:  Full (Time, Place, and Person)  Thought Content: Logical   Suicidal Thoughts:  No  Homicidal Thoughts:  No  Memory:  WNL  Judgement:  Good  Insight:  Good  Psychomotor Activity:  Normal  Concentration:  Concentration: Good and Attention Span: Good  Recall:  Good  Fund of Knowledge: Good  Language: Good  Assets:  Desire for Improvement  ADL's:  Intact  Cognition: WNL  Prognosis:  Good   DIAGNOSES:  No diagnosis found.   Receiving Psychotherapy: No   RECOMMENDATIONS:  PDMP was reviewed.  Xanax last filled 07/14/2023.  Gabapentin filled 08/02/2023.       Continue Xanax 1 mg, 1 p.o. twice daily as needed anxiety. Continue gabapentin 300 mg, 1 p.o. 3 times daily. Continue Zoloft 100 mg, 2.5 pills daily. Recommend counseling if needed. Return in 6 months.  Melony Overly, PA-C

## 2023-09-08 ENCOUNTER — Encounter: Payer: Self-pay | Admitting: Physician Assistant

## 2023-09-28 ENCOUNTER — Telehealth: Payer: BC Managed Care – PPO | Admitting: Family Medicine

## 2023-09-28 DIAGNOSIS — L709 Acne, unspecified: Secondary | ICD-10-CM | POA: Diagnosis not present

## 2023-09-28 MED ORDER — MINOCYCLINE HCL 50 MG PO TABS: 50.0000 mg | ORAL_TABLET | Freq: Two times a day (BID) | ORAL | 0 refills | Status: AC

## 2023-09-28 NOTE — Progress Notes (Signed)
E-Visit for Acne  We are sorry that you are experiencing this issue.  Here is how we plan to help!  Based on what you shared with me it looks like you have severe acne.  Acne is a disorder of the hair follicles and oil glands (sebaceous glands). The sebaceous glands secrete oils to keep the skin moist.  When the glands get clogged, it can lead to pimples or cysts.  These cysts may become infected and leave scars. Acne is very common and normally occurs at puberty.  Acne is also inherited.  Your personal care plan consists of the following recommendations:  I recommend that you use a daily cleanser  You might try an over the counter cleanser that has benzoyl peroxide.  I recommend that you start with a product that has 2.5% benzoyl peroxide.  Stronger concentrations have not been shown to be more effective.   I have also prescribed one of the following additional therapies:  Minocycline an oral antibiotic 50 mg twice a day  If excessive dryness or peeling occurs, reduce dose frequency or concentration of the topical scrubs.  If excessive stinging or burning occurs, remove the topical gel with mild soap and water and resume at a lower dose the next day.  Remember oral antibiotics and topical acne treatments may increase your sensitivity to the sun!  HOME CARE: Do not squeeze pimples because that can often lead to infections, worse acne, and scars. Use a moisturizer that contains retinoid or fruit acids that may inhibit the development of new acne lesions. Although there is not a clear link that foods can cause acne, doctors do believe that too many sweets predispose you to skin problems.  GET HELP RIGHT AWAY IF: If your acne gets worse or is not better within 10 days. If you become depressed. If you become pregnant, discontinue medications and call your OB/GYN.  MAKE SURE YOU: Understand these instructions. Will watch your condition. Will get help right away if you are not doing well or  get worse.  Thank you for choosing an e-visit.  Your e-visit answers were reviewed by a board certified advanced clinical practitioner to complete your personal care plan. Depending upon the condition, your plan could have included both over the counter or prescription medications.  Please review your pharmacy choice. Make sure the pharmacy is open so you can pick up prescription now. If there is a problem, you may contact your provider through [employer] and have the prescription routed to another pharmacy.  Your safety is important to [place]. If you have drug allergies check your prescription carefully.   For the next 24 hours you can use MyChart to ask questions about today's visit, request a non-urgent call back, or ask for a work or school excuse. You will get an email in the next two days asking about your experience. I hope that your e-visit has been valuable and will speed your recovery.    have provided 5 minutes of non face to face time during this encounter for chart review and documentation.

## 2023-09-30 ENCOUNTER — Telehealth: Payer: Self-pay | Admitting: Primary Care

## 2023-09-30 MED ORDER — BUDESONIDE-FORMOTEROL FUMARATE 160-4.5 MCG/ACT IN AERO: 2.0000 | INHALATION_SPRAY | Freq: Two times a day (BID) | RESPIRATORY_TRACT | 9 refills | Status: DC

## 2023-09-30 NOTE — Telephone Encounter (Signed)
Pt calling in to get her medication refilled but pharmacy is telling her they reached out to [place] for prescriber renewal for pt to get her Symbicort. She states she is completely out.

## 2023-09-30 NOTE — Telephone Encounter (Signed)
Patient is calling for a refill of Symbicort. Patient has been out for a couple of days now.

## 2023-10-01 ENCOUNTER — Other Ambulatory Visit: Payer: Self-pay | Admitting: Primary Care

## 2023-10-01 NOTE — Telephone Encounter (Signed)
Refilled 09-30-2023

## 2023-10-17 ENCOUNTER — Telehealth: Payer: Self-pay | Admitting: Pharmacist

## 2023-10-17 NOTE — Telephone Encounter (Signed)
Pending OV note from 11/15/23, patient is due for Dupixent PA renewal. PA form retained in [facility], PharmD, MPH, BCPS, CPP Clinical Pharmacist (Rheumatology and Pulmonology)

## 2023-10-21 ENCOUNTER — Other Ambulatory Visit: Payer: Self-pay | Admitting: Primary Care

## 2023-10-21 DIAGNOSIS — J45991 Cough variant asthma: Secondary | ICD-10-CM

## 2023-10-21 DIAGNOSIS — J329 Chronic sinusitis, unspecified: Secondary | ICD-10-CM

## 2023-10-22 NOTE — Telephone Encounter (Signed)
One motnh supply for Dupixent sent in. Further refills after OV on 11/15/23

## 2023-11-08 NOTE — Telephone Encounter (Signed)
Pharmacy states on 10/26 the Prior Auth was closed due to no response. Pharmacy Pls call w/ update info to the pharmacy @ [phone]

## 2023-11-08 NOTE — Telephone Encounter (Signed)
PA pending OV note from 11/15/23

## 2023-11-12 ENCOUNTER — Telehealth: Payer: BC Managed Care – PPO | Admitting: Physician Assistant

## 2023-11-12 DIAGNOSIS — L03032 Cellulitis of left toe: Secondary | ICD-10-CM | POA: Diagnosis not present

## 2023-11-12 MED ORDER — CEPHALEXIN 500 MG PO CAPS: 500.0000 mg | ORAL_CAPSULE | Freq: Three times a day (TID) | ORAL | 0 refills | Status: AC

## 2023-11-12 NOTE — Progress Notes (Signed)
Virtual Visit Consent   [name], you are scheduled for a virtual visit with a [facility] provider today. Just as with appointments in the office, your consent must be obtained to participate. Your consent will be active for this visit and any virtual visit you may have with one of our providers in the next 365 days. If you have a MyChart account, a copy of this consent can be sent to you electronically.  As this is a virtual visit, video technology does not allow for your provider to perform a traditional examination. This may limit your provider's ability to fully assess your condition. If your provider identifies any concerns that need to be evaluated in person or the need to arrange testing (such as labs, EKG, etc.), we will make arrangements to do so. Although advances in technology are sophisticated, we cannot ensure that it will always work on either your end or our end. If the connection with a video visit is poor, the visit may have to be switched to a telephone visit. With either a video or telephone visit, we are not always able to ensure that we have a secure connection.  By engaging in this virtual visit, you consent to the provision of healthcare and authorize for your insurance to be billed (if applicable) for the services provided during this visit. Depending on your insurance coverage, you may receive a charge related to this service.  I need to obtain your verbal consent now. Are you willing to proceed with your visit today? [name] has provided verbal consent on 11/12/2023 for a virtual visit (video or telephone). Joanne Molina, [place]  Date: 11/12/2023 5:03 PM  Virtual Visit via Video Note   I, Joanne Molina, connected with  LEKESHIA HUESTIS  ([id number], November 26, 1980) on 11/12/23 at  5:00 PM EST by a video-enabled telemedicine application and verified that I am speaking with the correct person using two identifiers.  Location: Patient: Virtual Visit Location  Patient: Home Provider: Virtual Visit Location Provider: Home Office   I discussed the limitations of evaluation and management by telemedicine and the availability of in person appointments. The patient expressed understanding and agreed to proceed.    History of Present Illness: Joanne Molina is a 43 y.o. who identifies as a female who was assigned female at birth, and is being seen today for pain and irritation of her L great toe first noted about 3-4 days ago. Notes redness, warmth and tenderness. Unsure if she has a hangnail in the area. Has been keeping area clean and dry, and applying neosporin to the area without any improvement. Today symptoms are worsening. Denies fever, chills.   HPI: HPI  Problems:  Patient Active Problem List   Diagnosis Date Noted   Chronic RUQ pain 07/31/2023   Irregular bowel habits 07/31/2023   Helicobacter pylori gastritis 05/14/2023   Abdominal pain 05/14/2023   Abnormal weight gain 05/14/2023   Abnormal weight loss 05/14/2023   Change in bowel habit 05/14/2023   Diarrhea 05/14/2023   Early satiety 05/14/2023   Epigastric pain 05/14/2023   Flatulence, eructation and gas pain 05/14/2023   Right lower quadrant pain 05/14/2023   Acid reflux 05/14/2023   Gastroesophageal reflux disease 05/14/2023   Recurrent sinusitis 04/30/2023   Acute sinusitis 10/14/2022   Hx of anorexia nervosa 10/14/2022   Chronic rhinitis 09/19/2021   Depression    Acute pain of right shoulder 01/30/2021   Body mass index (BMI) 28.0-28.9, adult 01/02/2021  Elevated blood-pressure reading, without diagnosis of hypertension 11/04/2020   Spinal stenosis, cervical region 09/02/2020   Lumbar back pain with radiculopathy affecting right lower extremity 09/02/2020   Gastropathy 10/05/2019   S/P laparoscopic hysterectomy 06/29/2019   Cough variant asthma 05/14/2019   Essential hypertension 05/14/2019   GAD (generalized anxiety disorder) 10/21/2018   MDD (major depressive  disorder) 10/21/2018   Skin picking habit 10/21/2018   Insomnia 08/07/2018   Anxiety 02/12/2017   Gestational hypertension 07/12/2016   Gestational hypertension w/o significant proteinuria in 3rd trimester 07/12/2016   Urinary frequency 09/09/2012   Biliary colic 08/30/2011   BLURRED VISION 11/22/2007    Allergies:  Allergies  Allergen Reactions   Erythromycin Nausea Only   Medications:  Current Outpatient Medications:    cephALEXin (KEFLEX) 500 MG capsule, Take 1 capsule (500 mg total) by mouth 3 (three) times daily for 7 days., Disp: 21 capsule, Rfl: 0   albuterol (VENTOLIN HFA) 108 (90 Base) MCG/ACT inhaler, Inhale 1 puff into the lungs every 6 (six) hours as needed for wheezing or shortness of breath. (Patient not taking: Reported on 09/06/2023), Disp: 18 g, Rfl: 11   ALPRAZolam (XANAX) 1 MG tablet, TAKE 1 TABLET BY MOUTH TWICE A DAY AS NEEDED FOR ANXIETY, Disp: 60 tablet, Rfl: 5   bisoprolol (ZEBETA) 5 MG tablet, Take 1 tablet (5 mg total) by mouth daily., Disp: 90 tablet, Rfl: 3   budesonide-formoterol (SYMBICORT) 160-4.5 MCG/ACT inhaler, INHALE 2 PUFFS INTO THE LUNGS TWICE A DAY, Disp: 10.2 each, Rfl: 5   cetirizine (ZYRTEC) 10 MG tablet, Take 1 tablet (10 mg total) by mouth daily., Disp: 30 tablet, Rfl: 11   dicyclomine (BENTYL) 10 MG capsule, Take 1 capsule (10 mg total) by mouth 4 (four) times daily -  before meals and at bedtime., Disp: 90 capsule, Rfl: 2   DUPIXENT 300 MG/2ML SOAJ, INJECT 1 PEN UNDER THE SKIN EVERY 14 DAYS, Disp: 4 mL, Rfl: 0   famotidine (PEPCID) 40 MG tablet, Take 40 mg by mouth daily., Disp: , Rfl:    fluticasone (FLONASE) 50 MCG/ACT nasal spray, Place 2 sprays into both nostrils daily., Disp: 16 g, Rfl: 6   gabapentin (NEURONTIN) 300 MG capsule, TAKE 1 CAPSULE BY MOUTH THREE TIMES A DAY (Patient taking differently: Take 300 mg by mouth at bedtime. TAKE 1 CAPSULE BY MOUTH THREE TIMES A DAY), Disp: 270 capsule, Rfl: 1   montelukast (SINGULAIR) 10 MG tablet,  Take 1 tablet (10 mg total) by mouth at bedtime., Disp: 90 tablet, Rfl: 3   sertraline (ZOLOFT) 100 MG tablet, Take 3 tablets (300 mg total) by mouth daily., Disp: 270 tablet, Rfl: 1  Observations/Objective: Patient is well-developed, well-nourished in no acute distress.  Resting comfortably at home.  Head is normocephalic, atraumatic.  No labored breathing. Speech is clear and coherent with logical content.  Patient is alert and oriented at baseline.  Left great tow with substantial redness, more so medially than laterally with drainage noted from base of nail. Normal ROM of toe.  Assessment and Plan: 1. Paronychia of great toe of left foot - cephALEXin (KEFLEX) 500 MG capsule; Take 1 capsule (500 mg total) by mouth 3 (three) times daily for 7 days.  Dispense: 21 capsule; Refill: 0  Supportive measures and OTC medications reviewed. Keflex per orders. Strict in person follow-up precautions discussed.   Follow Up Instructions: I discussed the assessment and treatment plan with the patient. The patient was provided an opportunity to ask questions and all were  answered. The patient agreed with the plan and demonstrated an understanding of the instructions.  A copy of instructions were sent to the patient via MyChart unless otherwise noted below.   The patient was advised to call back or seek an in-person evaluation if the symptoms worsen or if the condition fails to improve as anticipated.    Joanne Climes, PA-C

## 2023-11-12 NOTE — Patient Instructions (Signed)
Joanne Molina, thank you for joining [name], PA-C for today's virtual visit.  While this provider is not your primary care provider (PCP), if your PCP is located in our provider database this encounter information will be shared with them immediately following your visit.   A [facility] MyChart account gives you access to today's visit and all your visits, tests, and labs performed at [facility] " click here if you don't have a [facility] MyChart account or go to [email or web address]  Consent: (Patient) Joanne Molina provided verbal consent for this virtual visit at the beginning of the encounter.  Current Medications:  Current Outpatient Medications:    cephALEXin (KEFLEX) 500 MG capsule, Take 1 capsule (500 mg total) by mouth 3 (three) times daily for 7 days., Disp: 21 capsule, Rfl: 0   albuterol (VENTOLIN HFA) 108 (90 Base) MCG/ACT inhaler, Inhale 1 puff into the lungs every 6 (six) hours as needed for wheezing or shortness of breath. (Patient not taking: Reported on 09/06/2023), Disp: 18 g, Rfl: 11   ALPRAZolam (XANAX) 1 MG tablet, TAKE 1 TABLET BY MOUTH TWICE A DAY AS NEEDED FOR ANXIETY, Disp: 60 tablet, Rfl: 5   bisoprolol (ZEBETA) 5 MG tablet, Take 1 tablet (5 mg total) by mouth daily., Disp: 90 tablet, Rfl: 3   budesonide-formoterol (SYMBICORT) 160-4.5 MCG/ACT inhaler, INHALE 2 PUFFS INTO THE LUNGS TWICE A DAY, Disp: 10.2 each, Rfl: 5   cetirizine (ZYRTEC) 10 MG tablet, Take 1 tablet (10 mg total) by mouth daily., Disp: 30 tablet, Rfl: 11   dicyclomine (BENTYL) 10 MG capsule, Take 1 capsule (10 mg total) by mouth 4 (four) times daily -  before meals and at bedtime., Disp: 90 capsule, Rfl: 2   DUPIXENT 300 MG/2ML SOAJ, INJECT 1 PEN UNDER THE SKIN EVERY 14 DAYS, Disp: 4 mL, Rfl: 0   famotidine (PEPCID) 40 MG tablet, Take 40 mg by mouth daily., Disp: , Rfl:    fluticasone (FLONASE) 50 MCG/ACT nasal spray, Place 2 sprays into both nostrils daily., Disp: 16 g,  Rfl: 6   gabapentin (NEURONTIN) 300 MG capsule, TAKE 1 CAPSULE BY MOUTH THREE TIMES A DAY (Patient taking differently: Take 300 mg by mouth at bedtime. TAKE 1 CAPSULE BY MOUTH THREE TIMES A DAY), Disp: 270 capsule, Rfl: 1   montelukast (SINGULAIR) 10 MG tablet, Take 1 tablet (10 mg total) by mouth at bedtime., Disp: 90 tablet, Rfl: 3   sertraline (ZOLOFT) 100 MG tablet, Take 3 tablets (300 mg total) by mouth daily., Disp: 270 tablet, Rfl: 1   Medications ordered in this encounter:  Meds ordered this encounter  Medications   cephALEXin (KEFLEX) 500 MG capsule    Sig: Take 1 capsule (500 mg total) by mouth 3 (three) times daily for 7 days.    Dispense:  21 capsule    Refill:  0    Order Specific Question:   Supervising Provider    Answer:   Merrilee Jansky [id number]     *If you need refills on other medications prior to your next appointment, please contact your pharmacy*  Follow-Up: Call back or seek an in-person evaluation if the symptoms worsen or if the condition fails to improve as anticipated.  [facility] [phone]  Other Instructions Paronychia Paronychia is an infection of the skin. It happens near a fingernail or toenail. It may cause pain and swelling around the nail. In some cases, a fluid-filled bump (abscess) can form near or under  the nail. Often, this condition is not serious, and it clears up with treatment. What are the causes? This condition may be caused by a germ. The germ may be bacteria or a fungus. These germs can enter the body through an opening in the skin, such as a cut or a hangnail. Other causes include: Repeated injuries to your fingernails or toenails. Irritation of the base and sides of the nail (cuticle). What increases the risk? This condition is more likely to develop in people who: Get their hands wet often, such as a dishwasher. Bite their fingernails or the base and sides of their nails. Have other skin problems. Have  hangnails or hurt fingertips. Come into contact with chemicals like detergents. Have diabetes. What are the signs or symptoms? Redness and swelling of the skin near the nail. A tender feeling around the nail. Pus-filled bumps under the skin at the base and sides of the nail. Fluid or pus under the nail. Pain in the area. How is this treated? Treatment depends on the cause of your condition and how bad it is. If your condition is mild, it may clear up on its own in a few days or after soaking in warm water. If needed, treatment may include: Antibiotic medicine. Antifungal medicine. A procedure to drain pus from a fluid-filled bump. Medicine to treat irritation and swelling (corticosteroids). Taking off part of an ingrown toenail. A bandage (dressing) may be placed over the nail area. Follow these instructions at home: Wound care Keep the affected area clean. Soak the fingers or toes in warm water as told by your doctor. You may be told to do this for 20 minutes, 2-3 times a day. Keep the area dry when you are not soaking it. Do not try to drain a fluid-filled bump on your own. Follow instructions from your doctor about how to take care of the affected area. Make sure you: Wash your hands with soap and water for at least 20 seconds before and after you change your bandage. If you cannot use soap and water, use hand sanitizer. Change your bandage as told by your doctor. If you had a fluid-filled bump and your doctor drained it, check the area every day for signs of infection. Check for: Redness, swelling, or pain. Fluid or blood. Warmth. Pus or a bad smell. Medicines  Take over-the-counter and prescription medicines only as told by your doctor. If you were prescribed an antibiotic medicine, take it as told by your doctor. Do not stop taking it even if you start to feel better. General instructions Avoid contact with anything that irritates your skin or that you are allergic to. Do  not pick at the affected area. Keep all follow-up visits. Prevention To prevent this condition from happening again: Wear rubber gloves when putting your hands in water for washing dishes or other tasks. Wear gloves if your hands might touch cleaners or chemicals. Avoid injuring your nails or fingertips. Do not bite your nails or tear hangnails. Do not cut your nails very short. Do not cut the skin at the base and sides of the nail. Use clean nail clippers or scissors when trimming nails. Contact a doctor if: You feel worse. You do not get better. You keep having or you have more fluid, blood, or pus coming from the affected area. Your affected finger, toe, or joint gets swollen or hard to move. You have a fever or chills. There is redness spreading from the affected area. Summary Paronychia is  an infection of the skin. It happens near a fingernail or toenail. This condition may cause pain and swelling around the nail. Soak the fingers or toes in warm water as told by your doctor. Often, this condition is not serious, and it clears up with treatment. This information is not intended to replace advice given to you by your health care provider. Make sure you discuss any questions you have with your health care provider. Document Revised: 03/20/2021 Document Reviewed: 03/20/2021 Elsevier Patient Education  2024 [employer]    If you have been instructed to have an in-person evaluation today at a local Urgent Care facility, please use the link below. It will take you to a list of all of our available [facility], including address, phone number and hours of operation. Please do not delay care.  [facility]  If you or a family member do not have a primary care provider, use the link below to schedule a visit and establish care. When you choose a [facility] primary care physician or advanced practice provider, you gain a long-term partner in health. Find a  Primary Care Provider  Learn more about [facility]'s in-office and virtual care options: [facility] - Get Care Now

## 2023-11-14 ENCOUNTER — Other Ambulatory Visit: Payer: Self-pay | Admitting: Primary Care

## 2023-11-14 DIAGNOSIS — J329 Chronic sinusitis, unspecified: Secondary | ICD-10-CM

## 2023-11-14 DIAGNOSIS — J45991 Cough variant asthma: Secondary | ICD-10-CM

## 2023-11-15 ENCOUNTER — Ambulatory Visit (INDEPENDENT_AMBULATORY_CARE_PROVIDER_SITE_OTHER): Payer: BC Managed Care – PPO | Admitting: Primary Care

## 2023-11-15 ENCOUNTER — Encounter: Payer: Self-pay | Admitting: Primary Care

## 2023-11-15 ENCOUNTER — Telehealth: Payer: Self-pay | Admitting: Primary Care

## 2023-11-15 VITALS — BP 126/76 | HR 76 | Temp 98.4°F | Ht 67.0 in | Wt 180.0 lb

## 2023-11-15 DIAGNOSIS — J329 Chronic sinusitis, unspecified: Secondary | ICD-10-CM | POA: Diagnosis not present

## 2023-11-15 DIAGNOSIS — J45991 Cough variant asthma: Secondary | ICD-10-CM | POA: Diagnosis not present

## 2023-11-15 NOTE — Patient Instructions (Addendum)
No changes today  Continue Dupixent every 14 days Continue Symbicort two puffs morning and evening Continue Singulair 10mg  at bedtime   Follow-up 6 months with Beth NP or sooner if needed   Asthma, Adult  Asthma is a condition that causes swelling and narrowing of the airways. These are the passages that lead from the nose and mouth down into the lungs. When asthma symptoms get worse it is called an asthma attack or flare. This can make it hard to breathe. Asthma flares can range from minor to life-threatening. There is no cure for asthma, but medicines and lifestyle changes can help to control it. What are the causes? It is not known exactly what causes asthma, but certain things can cause asthma symptoms to get worse (triggers). What can trigger an asthma attack? Cigarette smoke. Mold. Dust. Your pet's skin flakes (dander). Cockroaches. Pollen. Air pollution (like household cleaners, wood smoke, smog, or Therapist, occupational). What are the signs or symptoms? Trouble breathing (shortness of breath). Coughing. Making high-pitched whistling sounds when you breathe, most often when you breathe out (wheezing). Chest tightness. Tiredness with little activity. Poor exercise tolerance. How is this treated? Controller medicines that help prevent asthma symptoms. Fast-acting reliever or rescue medicines. These give short-term relief of asthma symptoms. Allergy medicines if your attacks are brought on by allergens. Medicines to help control the body's defense (immune) system. Staying away from the things that cause asthma attacks. Follow these instructions at home: Avoiding triggers in your home Do not allow anyone to smoke in your home. Limit use of fireplaces and wood stoves. Get rid of pests (such as roaches and mice) and their droppings. Keep your home clean. Clean your floors. Dust regularly. Use cleaning products that do not smell. Wash bed sheets and blankets every week in hot  water. Dry them in a dryer. Have someone vacuum when you are not home. Change your heating and air conditioning filters often. Use blankets that are made of polyester or cotton. General instructions Take over-the-counter and prescription medicines only as told by your doctor. Do not smoke or use any products that contain nicotine or tobacco. If you need help quitting, ask your doctor. Stay away from secondhand smoke. Avoid doing things outdoors when allergen counts are high and when air quality is low. Warm up before you exercise. Take time to cool down after exercise. Use a peak flow meter as told by your doctor. A peak flow meter is a tool that measures how well your lungs are working. Keep track of the peak flow meter's readings. Write them down. Follow your asthma action plan. This is a written plan for taking care of your asthma and treating your attacks. Make sure you get all the shots (vaccines) that your doctor recommends. Ask your doctor about a flu shot and a pneumonia shot. Keep all follow-up visits. Contact a doctor if: You have wheezing, shortness of breath, or a cough even while taking medicine to prevent attacks. The mucus you cough up (sputum) is thicker than usual. The mucus you cough up changes from clear or white to yellow, green, gray, or is bloody. You have problems from the medicine you are taking, such as: A rash. Itching. Swelling. Trouble breathing. You need reliever medicines more than 2-3 times a week. Your peak flow reading is still at 50-79% of your personal best after following the action plan for 1 hour. You have a fever. Get help right away if: You seem to be worse and are  not responding to medicine during an asthma attack. You are short of breath even at rest. You get short of breath when doing very little activity. You have trouble eating, drinking, or talking. You have chest pain or tightness. You have a fast heartbeat. Your lips or fingernails  start to turn blue. You are light-headed or dizzy, or you faint. Your peak flow is less than 50% of your personal best. You feel too tired to breathe normally. These symptoms may be an emergency. Get help right away. Call 911. Do not wait to see if the symptoms will go away. Do not drive yourself to the hospital. Summary Asthma is a long-term (chronic) condition in which the airways get tight and narrow. An asthma attack can make it hard to breathe. Asthma cannot be cured, but medicines and lifestyle changes can help control it. Make sure you understand how to avoid triggers and how and when to use your medicines. Avoid things that can cause allergy symptoms (allergens). These include animal skin flakes (dander) and pollen from trees or grass. Avoid things that pollute the air. These may include household cleaners, wood smoke, smog, or chemical odors. This information is not intended to replace advice given to you by your health care provider. Make sure you discuss any questions you have with your health care provider. Document Revised: 09/25/2021 Document Reviewed: 09/25/2021 Elsevier Patient Education  2024 [employer].

## 2023-11-15 NOTE — Progress Notes (Unsigned)
@Patient  ID: Joanne Molina, female    DOB: 14-Dec-1980, 43 y.o.   MRN: [id number]  Chief Complaint  Patient presents with   Follow-up    Breathing has improved. No current sx.     Referring provider: Shirline Frees, NP  HPI: 43 year old female, never smoked. PMH significant for HTN, cough variant asthma, chronic rhinitis, hysterectomy. Patient of Dr. Sherene Sires, last seen by pulmonary NP on 09/19/21.  Previous [facility] pulmonary encounter: 09/19/2021 Follow up: Asthma  Patient returns for a follow-up visit.  Patient has underlying asthma.  She says she typically has a flare of her asthma every fall.  Over the last 2 years has been doing well up until this past 2 weeks.  Patient started having increased sinus congestion drainage.  She took a COVID test that was negative.  Symptoms continue to worsen despite using Mucinex Claritin and saline nasal rinses.  She was seen at a local urgent care and given a Z-Pak and a prednisone taper.  Patient said her symptoms continued without significant improvement.  She went to the emergency room on September 13, 2021.  COVID-19 test was negative.  Chest x-ray showed no acute process.  She was given a Depo-Medrol injection and started on a Medrol Dosepak.  Patient says yesterday she started having some improvement in symptoms.  She also changed from Symbicort 80 daily to Symbicort 160 twice daily.  She denies any hemoptysis chest pain orthopnea PND.  [name] a Radio producer.  She does not have any pets.  12/29/2021 Patient presents today for 3 month follow-up with PFTs. During last visit she was started on Singulair 10mg  at bedtime and changed from Claritin to Zyrtec. [name] maintained on Symbicort two puffs twice daily. She had RSV three weeks ago, she was evaluated by UC and she was treated with steroid dose pack. She has fully recovered. She feels higher dose Symbicort has made a big difference in her brething. She feels much improved since medications changes.  Eos absolute 200 in March 2022. She did not have lab work done but will today.   09/21/2022  Patient presents today for follow-up/cough variant asthma.  [name] doing well. Asthma is not currently exacerbated but this time of year does tend to flare. [name] dealing with sinusitis symptoms. She has frontal sinus pressure for last week.  Mucus is thick. Last sinus infection was in July. She takes mucinex, claritin and singulair daily. She does not tolerating Flonase d.t heart palpitations. She will use saline nasal spray as needed. [name] concerned about being on steroids for her asthma d/t hx anorexia. Needs refills today.  04/30/2023  Patient presents today for 6 month follow-up. PMH significant for cough variant asthma and recurrent sinusitis. Maintained Symbicort , Bioprolol, Singulair and saline nasal rinses. Referred to ENT during our last visit but was not able to go d/t family tragedy.  She continues to have recurrent sinusitis symptoms. Sinus drainage exacerbate her asthma symptoms. She will develop productive cough along with shortness of breath and wheezing symptoms. Currently complaining of facial pain/pressure. She gets sinus infections every 1-2 months. She was recently given steroids without much improvement, did not feel course was long enough. Eosinophils in March were 300. IgE 44. CT cervical spine in February 2024 showed diffuse paranasal sinus disease with fluid level within the left maxillary sinus, suggestin acute sinusitis.   06/24/2023 Patient presents today for 2 month follow-up. Hx cough variant asthma and recurrent sinusitis. Maintained on  symbicort , bisoprolol, Singulair and saline nasal rinses. She gets sinus infections every 1-2 months which will exacerbate her asthma. Eosinophils in March were 300. IgE 44. CT cervical spine in February 2024 showed diffuse paranasal sinus disease with fluid level within the left maxillary sinus, suggesting acute sinusitis. We started the  process for Dupixent during our last visit. She received her first injection end of May.   Severe persistent asthma with recurrent sinusitis  Hx nasal polyps, has not seen ENT but will be establishing  She has been off and on prednisone since October 2023   Continues to have persistent sinus pain and pressure. [name] still getting up thick mucus from her chest and sinuses. Any upper respiratory infection/cold will settle in her chest. [name] using mucinex twice a day and sinus rinses.  Only last couple of days has she noticed pressure and dizziness.  She has not had to use albuterol since starting Dupixent  No asthma flare ups Doing well with injections, received two in the office and now giving herself them at home  CT abdomen showed clear lung bases in May 2024   She always improve with abx but relief doesn't last  Pulmonary function testing: 12/29/2021 FVC 4.47 (113%); FEV1 3.28 (102%), ratio 73, TLC 107%, DLCOunc 105% p using Symbicort 160 this morning. NO BD response.    11/15/2023 Started on Dupixent in May, Due for dupixent renewal   Discussed the use of AI scribe software for clinical note transcription with the patient, who gave verbal consent to proceed.  History of Present Illness   The patient, diagnosed with asthma and recurrent sinusitis, started Dupixent therapy in May. She reports significant improvement in her condition, stating it's the "best since 2016." She has not required prednisone, steroids, or antibiotics this fall season and has only used her rescue inhaler a few times, primarily during weather changes. She describes her breathing as improved and notes a positive impact on her voice projection and singing.  In addition to asthma, the patient has been managing sinus issues, which she reports are better controlled. She has been seeing an ENT specialist and was scheduled for a CT scan, but the appointment was never made. Despite this, the patient reports an overall  improvement in upper respiratory symptoms, which typically worsen from August through the end of the year.  The patient continues to take Symbicort and Singulair, with minimal use of her rescue inhaler since starting Dupixent. She expresses willingness to continue Dupixent indefinitely due to the significant improvement in her quality of life. She has not experienced any asthma exacerbations or sinus infections requiring steroids, prednisone, or antibiotics since July.             Allergies  Allergen Reactions   Erythromycin Nausea Only    Immunization History  Administered Date(s) Administered   Influenza Split 09/09/2012   Tdap 05/04/2016    Past Medical History:  Diagnosis Date   Anxiety    panic attack   Cough variant asthma 05/14/2019   Depression    Gastropathy 10/05/2019   Migraine    S/P laparoscopic hysterectomy 06/29/2019    Tobacco History: Social History   Tobacco Use  Smoking Status Never   Passive exposure: Past  Smokeless Tobacco Never   Counseling given: Not Answered   Outpatient Medications Prior to Visit  Medication Sig Dispense Refill   albuterol (VENTOLIN HFA) 108 (90 Base) MCG/ACT inhaler Inhale 1 puff into the lungs every 6 (six) hours as needed  for wheezing or shortness of breath. 18 g 11   ALPRAZolam (XANAX) 1 MG tablet TAKE 1 TABLET BY MOUTH TWICE A DAY AS NEEDED FOR ANXIETY 60 tablet 5   bisoprolol (ZEBETA) 5 MG tablet Take 1 tablet (5 mg total) by mouth daily. 90 tablet 3   budesonide-formoterol (SYMBICORT) 160-4.5 MCG/ACT inhaler INHALE 2 PUFFS INTO THE LUNGS TWICE A DAY 10.2 each 5   cephALEXin (KEFLEX) 500 MG capsule Take 1 capsule (500 mg total) by mouth 3 (three) times daily for 7 days. 21 capsule 0   cetirizine (ZYRTEC) 10 MG tablet Take 1 tablet (10 mg total) by mouth daily. 30 tablet 11   DUPIXENT 300 MG/2ML SOAJ INJECT 1 PEN UNDER THE SKIN EVERY 14 DAYS 4 mL 0   famotidine (PEPCID) 40 MG tablet Take 40 mg by mouth daily.      fluticasone (FLONASE) 50 MCG/ACT nasal spray Place 2 sprays into both nostrils daily. 16 g 6   gabapentin (NEURONTIN) 300 MG capsule TAKE 1 CAPSULE BY MOUTH THREE TIMES A DAY (Patient taking differently: Take 300 mg by mouth at bedtime. TAKE 1 CAPSULE BY MOUTH THREE TIMES A DAY) 270 capsule 1   montelukast (SINGULAIR) 10 MG tablet Take 1 tablet (10 mg total) by mouth at bedtime. 90 tablet 3   sertraline (ZOLOFT) 100 MG tablet Take 3 tablets (300 mg total) by mouth daily. 270 tablet 1   dicyclomine (BENTYL) 10 MG capsule Take 1 capsule (10 mg total) by mouth 4 (four) times daily -  before meals and at bedtime. 90 capsule 2   No facility-administered medications prior to visit.   Review of Systems  Review of Systems  Constitutional: Negative.   HENT: Negative.    Respiratory: Negative.     Physical Exam  BP 126/76 (BP Location: Left Arm, Cuff Size: Normal)   Pulse 76   Temp 98.4 F (36.9 C) (Oral)   Ht 5\' 7"  (1.702 m)   Wt 180 lb (81.6 kg)   LMP 06/02/2019 (Approximate)   SpO2 97%   BMI 28.19 kg/m  Physical Exam Constitutional:      Appearance: Normal appearance.  HENT:     Head: Normocephalic and atraumatic.     Mouth/Throat:     Mouth: Mucous membranes are moist.     Pharynx: Oropharynx is clear.  Cardiovascular:     Rate and Rhythm: Normal rate and regular rhythm.  Pulmonary:     Effort: Pulmonary effort is normal.     Breath sounds: Normal breath sounds.  Skin:    General: Skin is warm and dry.  Neurological:     General: No focal deficit present.     Mental Status: [name] alert and oriented to person, place, and time. Mental status is at baseline.  Psychiatric:        Mood and Affect: Mood normal.        Behavior: Behavior normal.        Thought Content: Thought content normal.        Judgment: Judgment normal.      Lab Results:  CBC    Component Value Date/Time   WBC 10.9 (H) 05/12/2023 1521   RBC 4.52 05/12/2023 1521   HGB 14.6 05/12/2023 1521   HGB  14.0 10/23/2017 0920   HCT 42.2 05/12/2023 1521   HCT 40.1 10/23/2017 0920   PLT 217 05/12/2023 1521   PLT 276 10/23/2017 0920   MCV 93.4 05/12/2023 1521   MCV 90 10/23/2017 0920  MCH 32.3 05/12/2023 1521   MCHC 34.6 05/12/2023 1521   RDW 12.6 05/12/2023 1521   RDW 13.2 10/23/2017 0920   LYMPHSABS 2.1 03/08/2023 1133   MONOABS 0.5 03/08/2023 1133   EOSABS 0.3 03/08/2023 1133   BASOSABS 0.1 03/08/2023 1133    BMET    Component Value Date/Time   NA 138 05/12/2023 1521   NA 141 04/13/2022 1620   K 4.5 05/12/2023 1521   CL 104 05/12/2023 1521   CO2 26 05/12/2023 1521   GLUCOSE 88 05/12/2023 1521   BUN 15 05/12/2023 1521   BUN 17 04/13/2022 1620   CREATININE 1.07 (H) 05/12/2023 1521   CALCIUM 9.4 05/12/2023 1521   GFRNONAA >60 05/12/2023 1521   GFRAA >60 06/25/2019 1454    BNP No results found for: "BNP"  ProBNP No results found for: "PROBNP"  Imaging: No results found.   Assessment & Plan:   No problem-specific Assessment & Plan notes found for this encounter.   Assessment and Plan    Asthma Significant improvement in symptoms since starting Dupixent in May. Minimal use of rescue inhaler. Currently on Symbicort and Singulair. -Continue Dupixent, Symbicort, and Singulair. -Consider decreasing Symbicort after winter season if no flare-ups occur.  Recurrent Sinusitis Improvement noted, currently under care of ENT. CT sinus scan was ordered but not scheduled. -Message sent to ENT regarding scheduling of CT sinus scan. -Continue current management under ENT.  Follow-up in 6 months to assess asthma control and potential step-down of inhaled steroid therapy.        Glenford Bayley, NP 11/15/2023

## 2023-11-15 NOTE — Telephone Encounter (Signed)
Patient was not contacted to schedule CT sinuses that you ordered back in July

## 2023-11-15 NOTE — Telephone Encounter (Signed)
Submitted an URGENT Prior Authorization RENEWAL request to [facility] for DUPIXENT via CoverMyMeds. Pending clinical questions to populate  Key: [id number]

## 2023-11-15 NOTE — Telephone Encounter (Signed)
Received notification from [facility] regarding a prior authorization for DUPIXENT. Authorization has been APPROVED from 11/15/23 to 11/14/24. Approval letter sent to scan center.  Patient must continue to fill through [facility]: [phone]  Authorization #  [id number]  Chesley Mires, PharmD, MPH, BCPS, CPP Clinical Pharmacist (Rheumatology and Pulmonology)

## 2023-11-15 NOTE — Telephone Encounter (Signed)
 Clinical questions for Dupixent PA completed

## 2023-12-06 ENCOUNTER — Ambulatory Visit (HOSPITAL_COMMUNITY)
Admission: RE | Admit: 2023-12-06 | Discharge: 2023-12-06 | Disposition: A | Discharge status: Home / Self Care | Payer: BC Managed Care – PPO | Source: Ambulatory Visit | Attending: Otolaryngology

## 2023-12-06 DIAGNOSIS — J329 Chronic sinusitis, unspecified: Secondary | ICD-10-CM | POA: Insufficient documentation

## 2023-12-20 ENCOUNTER — Telehealth: Payer: BC Managed Care – PPO | Admitting: Family Medicine

## 2023-12-20 DIAGNOSIS — J019 Acute sinusitis, unspecified: Secondary | ICD-10-CM | POA: Diagnosis not present

## 2023-12-20 DIAGNOSIS — J4540 Moderate persistent asthma, uncomplicated: Secondary | ICD-10-CM | POA: Diagnosis not present

## 2023-12-20 DIAGNOSIS — B9689 Other specified bacterial agents as the cause of diseases classified elsewhere: Secondary | ICD-10-CM

## 2023-12-20 MED ORDER — PREDNISONE 20 MG PO TABS: 20.0000 mg | ORAL_TABLET | Freq: Two times a day (BID) | ORAL | 0 refills | Status: AC

## 2023-12-20 MED ORDER — DOXYCYCLINE HYCLATE 100 MG PO TABS: 100.0000 mg | ORAL_TABLET | Freq: Two times a day (BID) | ORAL | 0 refills | Status: AC

## 2023-12-20 NOTE — Progress Notes (Signed)
E-Visit for Sinus Problems  We are sorry that you are not feeling well.  Here is how we plan to help!  Based on what you have shared with me it looks like you have sinusitis.  Sinusitis is inflammation and infection in the sinus cavities of the head.  Based on your presentation I believe you most likely have Acute Bacterial Sinusitis.  This is an infection caused by bacteria and is treated with antibiotics. I have prescribed Doxycycline 100mg  by mouth twice a day for 10 days. You may use an oral decongestant such as Mucinex D or if you have glaucoma or high blood pressure use plain Mucinex. Saline nasal spray help and can safely be used as often as needed for congestion.  If you develop worsening sinus pain, fever or notice severe headache and vision changes, or if symptoms are not better after completion of antibiotic, please schedule an appointment with a health care provider.    Sinus infections are not as easily transmitted as other respiratory infection, however we still recommend that you avoid close contact with loved ones, especially the very young and elderly.  Remember to wash your hands thoroughly throughout the day as this is the number one way to prevent the spread of infection!  I will also send prednisone.   Home Care: Only take medications as instructed by your medical team. Complete the entire course of an antibiotic. Do not take these medications with alcohol. A steam or ultrasonic humidifier can help congestion.  You can place a towel over your head and breathe in the steam from hot water coming from a faucet. Avoid close contacts especially the very young and the elderly. Cover your mouth when you cough or sneeze. Always remember to wash your hands.  Get Help Right Away If: You develop worsening fever or sinus pain. You develop a severe head ache or visual changes. Your symptoms persist after you have completed your treatment plan.  Make sure you Understand these  instructions. Will watch your condition. Will get help right away if you are not doing well or get worse.  Thank you for choosing an e-visit.  Your e-visit answers were reviewed by a board certified advanced clinical practitioner to complete your personal care plan. Depending upon the condition, your plan could have included both over the counter or prescription medications.  Please review your pharmacy choice. Make sure the pharmacy is open so you can pick up prescription now. If there is a problem, you may contact your provider through [employer] and have the prescription routed to another pharmacy.  Your safety is important to [place]. If you have drug allergies check your prescription carefully.   For the next 24 hours you can use MyChart to ask questions about today's visit, request a non-urgent call back, or ask for a work or school excuse. You will get an email in the next two days asking about your experience. I hope that your e-visit has been valuable and will speed your recovery.    have provided 5 minutes of non face to face time during this encounter for chart review and documentation.

## 2024-01-17 ENCOUNTER — Other Ambulatory Visit (INDEPENDENT_AMBULATORY_CARE_PROVIDER_SITE_OTHER): Payer: Self-pay | Admitting: Otolaryngology

## 2024-03-25 ENCOUNTER — Other Ambulatory Visit: Payer: Self-pay | Admitting: Physician Assistant

## 2024-03-25 NOTE — Telephone Encounter (Signed)
Please call to schedule FU, past due

## 2024-03-27 NOTE — Telephone Encounter (Signed)
 Sent MyChart msg

## 2024-04-02 ENCOUNTER — Other Ambulatory Visit: Payer: Self-pay | Admitting: Physician Assistant

## 2024-04-03 ENCOUNTER — Telehealth: Payer: Self-pay | Admitting: Physician Assistant

## 2024-04-03 ENCOUNTER — Other Ambulatory Visit: Payer: Self-pay

## 2024-04-03 MED ORDER — ALPRAZOLAM 1 MG PO TABS: ORAL_TABLET | ORAL | 1 refills | Status: DC

## 2024-04-03 NOTE — Telephone Encounter (Signed)
 Next visit is 05/14/24. Requesting refill on Alprazolam 1 mg called to:  [id number] [place], [place] - [address]   Phone: [phone]  Fax: [phone]

## 2024-04-03 NOTE — Telephone Encounter (Signed)
Already approved

## 2024-04-20 ENCOUNTER — Other Ambulatory Visit: Payer: Self-pay | Admitting: Primary Care

## 2024-04-29 ENCOUNTER — Telehealth: Admitting: Physician Assistant

## 2024-04-29 DIAGNOSIS — B9689 Other specified bacterial agents as the cause of diseases classified elsewhere: Secondary | ICD-10-CM | POA: Diagnosis not present

## 2024-04-29 DIAGNOSIS — J019 Acute sinusitis, unspecified: Secondary | ICD-10-CM | POA: Diagnosis not present

## 2024-04-29 MED ORDER — AMOXICILLIN-POT CLAVULANATE 875-125 MG PO TABS: 1.0000 | ORAL_TABLET | Freq: Two times a day (BID) | ORAL | 0 refills | Status: DC

## 2024-04-29 NOTE — Progress Notes (Signed)
 I have spent 5 minutes in review of e-visit questionnaire, review and updating patient chart, medical decision making and response to patient.   [name], PA-C

## 2024-04-29 NOTE — Progress Notes (Signed)

## 2024-05-04 ENCOUNTER — Other Ambulatory Visit: Payer: Self-pay | Admitting: Primary Care

## 2024-05-04 DIAGNOSIS — J45991 Cough variant asthma: Secondary | ICD-10-CM

## 2024-05-04 DIAGNOSIS — J329 Chronic sinusitis, unspecified: Secondary | ICD-10-CM

## 2024-05-14 ENCOUNTER — Telehealth: Payer: Self-pay | Admitting: Primary Care

## 2024-05-14 ENCOUNTER — Ambulatory Visit: Payer: Self-pay | Admitting: Physician Assistant

## 2024-05-14 ENCOUNTER — Other Ambulatory Visit: Payer: Self-pay | Admitting: Physician Assistant

## 2024-05-14 ENCOUNTER — Encounter: Payer: Self-pay | Admitting: Primary Care

## 2024-05-14 ENCOUNTER — Ambulatory Visit: Payer: BC Managed Care – PPO | Admitting: Primary Care

## 2024-05-14 VITALS — BP 118/68 | HR 79 | Temp 98.4°F | Ht 66.0 in | Wt 185.0 lb

## 2024-05-14 DIAGNOSIS — J4540 Moderate persistent asthma, uncomplicated: Secondary | ICD-10-CM

## 2024-05-14 DIAGNOSIS — J329 Chronic sinusitis, unspecified: Secondary | ICD-10-CM

## 2024-05-14 DIAGNOSIS — J45991 Cough variant asthma: Secondary | ICD-10-CM

## 2024-05-14 MED ORDER — ALBUTEROL SULFATE HFA 108 (90 BASE) MCG/ACT IN AERS: 1.0000 | INHALATION_SPRAY | Freq: Four times a day (QID) | RESPIRATORY_TRACT | 11 refills | Status: AC | PRN

## 2024-05-14 MED ORDER — MONTELUKAST SODIUM 10 MG PO TABS: 10.0000 mg | ORAL_TABLET | Freq: Every day | ORAL | 3 refills | Status: AC

## 2024-05-14 MED ORDER — BUDESONIDE-FORMOTEROL FUMARATE 160-4.5 MCG/ACT IN AERO: 2.0000 | INHALATION_SPRAY | Freq: Two times a day (BID) | RESPIRATORY_TRACT | 5 refills | Status: AC

## 2024-05-14 MED ORDER — DUPIXENT 300 MG/2ML ~~LOC~~ SOAJ
300.0000 mg | SUBCUTANEOUS | 1 refills | Status: DC
Start: 2024-05-14 — End: 2024-10-20

## 2024-05-14 NOTE — Patient Instructions (Signed)
 -ASTHMA: Asthma is a condition where your airways narrow and swell, producing extra mucus, which can make breathing difficult. Your asthma is well-controlled with your current treatment plan, which includes Symbicort , montelukast , and cetirizine . You should continue taking these medications as prescribed. You are also doing well with your biweekly Dupixent  injections, and there have been no adverse reactions. Please ensure you have refills available for the next six months and confirm with your pharmacist that Dupixent  is covered by your new [employer].   INSTRUCTIONS: Please schedule a follow-up appointment in six months. If your asthma remains well-controlled, we may consider moving to annual follow-ups.   Follow-up: 6 months with Beth NP   Asthma, Adult  Asthma is a condition that causes swelling and narrowing of the airways. These are the passages that lead from the nose and mouth down into the lungs. When asthma symptoms get worse it is called an asthma attack or flare. This can make it hard to breathe. Asthma flares can range from minor to life-threatening. There is no cure for asthma, but medicines and lifestyle changes can help to control it. What are the causes? It is not known exactly what causes asthma, but certain things can cause asthma symptoms to get worse (triggers). What can trigger an asthma attack? Cigarette smoke. Mold. Dust. Your pet's skin flakes (dander). Cockroaches. Pollen. Air pollution (like household cleaners, wood smoke, smog, or Therapist, occupational). What are the signs or symptoms? Trouble breathing (shortness of breath). Coughing. Making high-pitched whistling sounds when you breathe, most often when you breathe out (wheezing). Chest tightness. Tiredness with little activity. Poor exercise tolerance. How is this treated? Controller medicines that help prevent asthma symptoms. Fast-acting reliever or rescue medicines. These give short-term relief of  asthma symptoms. Allergy  medicines if your attacks are brought on by allergens. Medicines to help control the body's defense (immune) system. Staying away from the things that cause asthma attacks. Follow these instructions at home: Avoiding triggers in your home Do not allow anyone to smoke in your home. Limit use of fireplaces and wood stoves. Get rid of pests (such as roaches and mice) and their droppings. Keep your home clean. Clean your floors. Dust regularly. Use cleaning products that do not smell. Wash bed sheets and blankets every week in hot water. Dry them in a dryer. Have someone vacuum when you are not home. Change your heating and air conditioning filters often. Use blankets that are made of polyester or cotton. General instructions Take over-the-counter and prescription medicines only as told by your doctor. Do not smoke or use any products that contain nicotine or tobacco. If you need help quitting, ask your doctor. Stay away from secondhand smoke. Avoid doing things outdoors when allergen counts are high and when air quality is low. Warm up before you exercise. Take time to cool down after exercise. Use a peak flow meter as told by your doctor. A peak flow meter is a tool that measures how well your lungs are working. Keep track of the peak flow meter's readings. Write them down. Follow your asthma action plan. This is a written plan for taking care of your asthma and treating your attacks. Make sure you get all the shots (vaccines) that your doctor recommends. Ask your doctor about a flu shot and a pneumonia shot. Keep all follow-up visits. Contact a doctor if: You have wheezing, shortness of breath, or a cough even while taking medicine to prevent attacks. The mucus you cough up (sputum) is thicker than  usual. The mucus you cough up changes from clear or white to yellow, green, gray, or is bloody. You have problems from the medicine you are taking, such as: A  rash. Itching. Swelling. Trouble breathing. You need reliever medicines more than 2-3 times a week. Your peak flow reading is still at 50-79% of your personal best after following the action plan for 1 hour. You have a fever. Get help right away if: You seem to be worse and are not responding to medicine during an asthma attack. You are short of breath even at rest. You get short of breath when doing very little activity. You have trouble eating, drinking, or talking. You have chest pain or tightness. You have a fast heartbeat. Your lips or fingernails start to turn blue. You are light-headed or dizzy, or you faint. Your peak flow is less than 50% of your personal best. You feel too tired to breathe normally. These symptoms may be an emergency. Get help right away. Call 911. Do not wait to see if the symptoms will go away. Do not drive yourself to the hospital. Summary Asthma is a long-term (chronic) condition in which the airways get tight and narrow. An asthma attack can make it hard to breathe. Asthma cannot be cured, but medicines and lifestyle changes can help control it. Make sure you understand how to avoid triggers and how and when to use your medicines. Avoid things that can cause allergy  symptoms (allergens). These include animal skin flakes (dander) and pollen from trees or grass. Avoid things that pollute the air. These may include household cleaners, wood smoke, smog, or chemical odors. This information is not intended to replace advice given to you by your health care provider. Make sure you discuss any questions you have with your health care provider. Document Revised: 09/25/2021 Document Reviewed: 09/25/2021 Elsevier Patient Education  2024 [employer].

## 2024-05-14 NOTE — Telephone Encounter (Signed)
 Submitted a Prior Authorization RENEWAL request to [facility] for DUPIXENT  via CoverMyMeds. Will update once we receive a response.  Key: [id number]   Per automated response: Your PA has been resolved, no additional PA is required. For further inquiries please contact the number on the back of the member prescription card.  [facility]  Geraldene Kleine, PharmD, MPH, BCPS, CPP Clinical Pharmacist (Rheumatology and Pulmonology)

## 2024-05-14 NOTE — Progress Notes (Signed)
 @Patient  ID: Joanne Molina, female    DOB: 02-09-1980, 44 y.o.   MRN: [id number]  Chief Complaint  Patient presents with   Follow-up    Doing well, very pleased with Dupixent .    Referring provider: Alto Atta, NP  HPI: 44 year old female, never smoked. PMH significant for HTN, cough variant asthma, chronic rhinitis, hysterectomy. Patient of Dr. Waymond Hailey, last seen by pulmonary NP on 09/19/21.  Previous [facility] pulmonary encounter: 09/19/2021 Follow up: Asthma  Patient returns for a follow-up visit.  Patient has underlying asthma.  She says she typically has a flare of her asthma every fall.  Over the last 2 years has been doing well up until this past 2 weeks.  Patient started having increased sinus congestion drainage.  She took a COVID test that was negative.  Symptoms continue to worsen despite using Mucinex Claritin and saline nasal rinses.  She was seen at a local urgent care and given a Z-Pak and a prednisone  taper.  Patient said her symptoms continued without significant improvement.  She went to the emergency room on September 13, 2021.  COVID-19 test was negative.  Chest x-ray showed no acute process.  She was given a Depo-Medrol  injection and started on a Medrol  Dosepak.  Patient says yesterday she started having some improvement in symptoms.  She also changed from Symbicort  80 daily to Symbicort  160 twice daily.  She denies any hemoptysis chest pain orthopnea PND.  [name] a Radio producer.  She does not have any pets.  12/29/2021 Patient presents today for 3 month follow-up with PFTs. During last visit she was started on Singulair  10mg  at bedtime and changed from Claritin to Zyrtec . [name] maintained on Symbicort  160mcg two puffs twice daily. She had RSV three weeks ago, she was evaluated by UC and she was treated with steroid dose pack. She has fully recovered. She feels higher dose Symbicort  has made a big difference in her brething. She feels much improved since medications changes.  Eos absolute 200 in March 2022. She did not have lab work done but will today.   09/21/2022  Patient presents today for follow-up/cough variant asthma.  [name] doing well. Asthma is not currently exacerbated but this time of year does tend to flare. [name] dealing with sinusitis symptoms. She has frontal sinus pressure for last week.  Mucus is thick. Last sinus infection was in July. She takes mucinex, claritin and singulair  daily. She does not tolerating Flonase  d.t heart palpitations. She will use saline nasal spray as needed. [name] concerned about being on steroids for her asthma d/t hx anorexia. Needs refills today.  04/30/2023  Patient presents today for 6 month follow-up. PMH significant for cough variant asthma and recurrent sinusitis. Maintained Symbicort  , Bioprolol, Singulair  and saline nasal rinses. Referred to ENT during our last visit but was not able to go d/t family tragedy.  She continues to have recurrent sinusitis symptoms. Sinus drainage exacerbate her asthma symptoms. She will develop productive cough along with shortness of breath and wheezing symptoms. Currently complaining of facial pain/pressure. She gets sinus infections every 1-2 months. She was recently given steroids without much improvement, did not feel course was long enough. Eosinophils in March were 300. IgE 44. CT cervical spine in February 2024 showed diffuse paranasal sinus disease with fluid level within the left maxillary sinus, suggestin acute sinusitis.   06/24/2023 Patient presents today for 2 month follow-up. Hx cough variant asthma and recurrent sinusitis. Maintained on symbicort   , bisoprolol , Singulair  and saline nasal rinses. She gets sinus infections every 1-2 months which will exacerbate her asthma. Eosinophils in March were 300. IgE 44. CT cervical spine in February 2024 showed diffuse paranasal sinus disease with fluid level within the left maxillary sinus, suggesting acute sinusitis. We started the  process for Dupixent  during our last visit. She received her first injection end of May.   Severe persistent asthma with recurrent sinusitis  Hx nasal polyps, has not seen ENT but will be establishing  She has been off and on prednisone  since October 2023   Continues to have persistent sinus pain and pressure. [name] still getting up thick mucus from her chest and sinuses. Any upper respiratory infection/cold will settle in her chest. [name] using mucinex twice a day and sinus rinses.  Only last couple of days has she noticed pressure and dizziness.  She has not had to use albuterol  since starting Dupixent   No asthma flare ups Doing well with injections, received two in the office and now giving herself them at home  CT abdomen showed clear lung bases in May 2024   She always improve with abx but relief doesn't last  11/15/2023 Discussed the use of AI scribe software for clinical note transcription with the patient, who gave verbal consent to proceed.  History of Present Illness   The patient, diagnosed with asthma and recurrent sinusitis, started Dupixent  therapy in May. She reports significant improvement in her condition, stating it's the "best since 2016." She has not required prednisone , steroids, or antibiotics this fall season and has only used her rescue inhaler a few times, primarily during weather changes. She describes her breathing as improved and notes a positive impact on her voice projection and singing.  In addition to asthma, the patient has been managing sinus issues, which she reports are better controlled. She has been seeing an ENT specialist and was scheduled for a CT scan, but the appointment was never made. Despite this, the patient reports an overall improvement in upper respiratory symptoms, which typically worsen from August through the end of the year.  The patient continues to take Symbicort  and Singulair , with minimal use of her rescue inhaler since starting  Dupixent . She expresses willingness to continue Dupixent  indefinitely due to the significant improvement in her quality of life. She has not experienced any asthma exacerbations or sinus infections requiring steroids, prednisone , or antibiotics since July.      05/14/2024 - Interim hx  Discussed the use of AI scribe software for clinical note transcription with the patient, who gave verbal consent to proceed.  History of Present Illness   PAETYN WESTERMANN "Sapir Sorell" is a 44 year old female with asthma who presents for a six month check-in.  During the recent allergy  season, she experienced intermittent episodes of coughing that required the use of her rescue inhaler. Dupixent  has significantly improved her condition, and she has not had any asthma flare-ups requiring antibiotics or prednisone  since July of last year.  She recently had a sinus infection treated with amoxicillin , which did not affect her breathing. She has not needed to visit a doctor for breathing issues in the past ten months.  Her current asthma management includes Symbicort , which she continues to use, and she rarely needs her rescue inhaler. She administers her allergy  injections at home every two weeks without adverse reactions. For allergy  management, she takes montelukast  and cetirizine .      Pulmonary function testing: 12/29/2021 FVC 4.47 (113%);  FEV1 3.28 (102%), ratio 73, TLC 107%, DLCOunc 105% p using Symbicort  160 this morning. NO BD response.   Positive RAST for dogs, cats, dust and grass. Eosinophils 300, IgE 44   Allergies  Allergen Reactions   Erythromycin Nausea Only    Immunization History  Administered Date(s) Administered   Influenza Split 09/09/2012   Tdap 05/04/2016    Past Medical History:  Diagnosis Date   Anxiety    panic attack   Cough variant asthma 05/14/2019   Depression    Gastropathy 10/05/2019   Migraine    S/P laparoscopic hysterectomy 06/29/2019    Tobacco History: Social  History   Tobacco Use  Smoking Status Never   Passive exposure: Past  Smokeless Tobacco Never   Counseling given: Not Answered   Outpatient Medications Prior to Visit  Medication Sig Dispense Refill   ALPRAZolam  (XANAX ) 1 MG tablet TAKE 1 TABLET BY MOUTH TWICE A DAY AS NEEDED FOR ANXIETY 60 tablet 1   bisoprolol  (ZEBETA ) 5 MG tablet Take 1 tablet (5 mg total) by mouth daily. 90 tablet 3   cetirizine  (ZYRTEC ) 10 MG tablet Take 1 tablet (10 mg total) by mouth daily. 30 tablet 11   dicyclomine  (BENTYL ) 10 MG capsule Take 1 capsule (10 mg total) by mouth 4 (four) times daily -  before meals and at bedtime. 90 capsule 2   DUPIXENT  300 MG/2ML SOAJ INJECT 1 PEN UNDER THE SKIN EVERY 14 DAYS 2 mL 1   famotidine  (PEPCID ) 40 MG tablet Take 40 mg by mouth daily.     fluticasone  (FLONASE ) 50 MCG/ACT nasal spray SPRAY 2 SPRAYS INTO EACH NOSTRIL EVERY DAY 48 mL 2   gabapentin  (NEURONTIN ) 300 MG capsule TAKE 1 CAPSULE BY MOUTH THREE TIMES A DAY (Patient taking differently: Take 300 mg by mouth at bedtime. TAKE 1 CAPSULE BY MOUTH THREE TIMES A DAY) 270 capsule 1   sertraline  (ZOLOFT ) 100 MG tablet Take 3 tablets (300 mg total) by mouth daily. 270 tablet 1   budesonide -formoterol  (SYMBICORT ) 160-4.5 MCG/ACT inhaler INHALE 2 PUFFS INTO THE LUNGS TWICE A DAY 10.2 each 5   montelukast  (SINGULAIR ) 10 MG tablet TAKE 1 TABLET BY MOUTH EVERYDAY AT BEDTIME 90 tablet 3   albuterol  (VENTOLIN  HFA) 108 (90 Base) MCG/ACT inhaler Inhale 1 puff into the lungs every 6 (six) hours as needed for wheezing or shortness of breath. (Patient not taking: Reported on 05/14/2024) 18 g 11   amoxicillin -clavulanate (AUGMENTIN ) 875-125 MG tablet Take 1 tablet by mouth 2 (two) times daily. (Patient not taking: Reported on 05/14/2024) 14 tablet 0   No facility-administered medications prior to visit.    Review of Systems  Review of Systems  Constitutional: Negative.   HENT: Negative.    Respiratory: Negative.  Negative for cough,  chest tightness, shortness of breath and wheezing.   Cardiovascular: Negative.    Physical Exam  BP 118/68 (BP Location: Left Arm, Patient Position: Sitting, Cuff Size: Normal)   Pulse 79   Temp 98.4 F (36.9 C) (Oral)   Ht 5\' 6"  (1.676 m)   Wt 185 lb (83.9 kg)   LMP 06/02/2019 (Approximate)   SpO2 99%   BMI 29.86 kg/m  Physical Exam Constitutional:      General: [name] not in acute distress.    Appearance: Normal appearance. [name] not ill-appearing.  HENT:     Head: Normocephalic and atraumatic.  Cardiovascular:     Rate and Rhythm: Normal rate and regular rhythm.  Pulmonary:  Effort: Pulmonary effort is normal.     Breath sounds: Normal breath sounds. No wheezing or rhonchi.  Skin:    General: Skin is warm and dry.  Neurological:     General: No focal deficit present.     Mental Status: [name] alert and oriented to person, place, and time. Mental status is at baseline.  Psychiatric:        Mood and Affect: Mood normal.        Behavior: Behavior normal.        Thought Content: Thought content normal.        Judgment: Judgment normal.      Lab Results:  CBC    Component Value Date/Time   WBC 10.9 (H) 05/12/2023 1521   RBC 4.52 05/12/2023 1521   HGB 14.6 05/12/2023 1521   HGB 14.0 10/23/2017 0920   HCT 42.2 05/12/2023 1521   HCT 40.1 10/23/2017 0920   PLT 217 05/12/2023 1521   PLT 276 10/23/2017 0920   MCV 93.4 05/12/2023 1521   MCV 90 10/23/2017 0920   MCH 32.3 05/12/2023 1521   MCHC 34.6 05/12/2023 1521   RDW 12.6 05/12/2023 1521   RDW 13.2 10/23/2017 0920   LYMPHSABS 2.1 03/08/2023 1133   MONOABS 0.5 03/08/2023 1133   EOSABS 0.3 03/08/2023 1133   BASOSABS 0.1 03/08/2023 1133    BMET    Component Value Date/Time   NA 138 05/12/2023 1521   NA 141 04/13/2022 1620   K 4.5 05/12/2023 1521   CL 104 05/12/2023 1521   CO2 26 05/12/2023 1521   GLUCOSE 88 05/12/2023 1521   BUN 15 05/12/2023 1521   BUN 17 04/13/2022 1620   CREATININE 1.07 (H)  05/12/2023 1521   CALCIUM 9.4 05/12/2023 1521   GFRNONAA >60 05/12/2023 1521   GFRAA >60 06/25/2019 1454    BNP No results found for: "BNP"  ProBNP No results found for: "PROBNP"  Imaging: No results found.   Assessment & Plan:   1. Allergic asthma, moderate persistent, uncomplicated (Primary)  Assessment and Plan    Asthma Asthma is well-controlled with current treatment regimen. Symptoms have significantly improved since starting Dupixent  in April 2024. She reports rare use of rescue inhaler and no need for prednisone  since July last year. Recent sinus infection did not affect breathing. Continues on Symbicort  and montelukast , with cetirizine  for allergies. No adverse reactions to Dupixent  injections, which are self-administered biweekly. Insurance change to [place] in January; no issues reported with medication coverage. - Continue Symbicort  160, montelukast , and cetirizine  as prescribed. - Continue Dupixent  injections biweekly. - Ensure refills are available for six months. - Confirm with pharmacist that Dupixent  is covered by [employer].  Follow-up Follow-up plan discussed to ensure continued asthma management and medication coverage. Consideration of annual follow-up if asthma remains well-controlled. - Schedule follow-up appointment in six months.   Antonio Baumgarten, NP 05/14/2024

## 2024-05-14 NOTE — Telephone Encounter (Signed)
 Insurance was changed from [place] to [place] back in January and patient just wanted to make sure Dupixent  was still covered. [name] getting medication without issue.

## 2024-05-14 NOTE — Addendum Note (Signed)
 Addended by: Thais Fill on: 05/14/2024 11:39 AM   Modules accepted: Orders

## 2024-05-24 ENCOUNTER — Telehealth: Admitting: Family Medicine

## 2024-05-24 DIAGNOSIS — H00011 Hordeolum externum right upper eyelid: Secondary | ICD-10-CM

## 2024-05-24 MED ORDER — MOXIFLOXACIN HCL 0.5 % OP SOLN: 1.0000 [drp] | Freq: Three times a day (TID) | OPHTHALMIC | 0 refills | Status: AC

## 2024-05-24 MED ORDER — NEOMYCIN-POLYMYXIN-HC 3.5-10000-1 OP SUSP
3.0000 [drp] | Freq: Four times a day (QID) | OPHTHALMIC | 0 refills | Status: AC
Start: 2024-05-24 — End: 2024-05-31

## 2024-05-24 NOTE — Progress Notes (Signed)
  E-Visit for Stye   We are sorry that you are not feeling well. Here is how we plan to help!  Based on what you have shared with me it looks like you have a stye.  A stye is an inflammation of the eyelid.  It is often a red, painful lump near the edge of the eyelid that may look like a boil or a pimple.  A stye develops when an infection occurs at the base of an eyelash.   We have made appropriate suggestions for you based upon your presentation: Your symptoms may indicate an infection of the sclera.  The use of anti-inflammatory and antibiotic eye drops for a week will help resolve this condition.  I have sent in neomycin-polymyxin HC opthalmic suspension, two to three drops in the affected eye every 4 hours.  If your symptoms do not improve over the next two to three days you should be seen in your doctor's office.  HOME CARE:  Wash your hands often! Let the stye open on its own. Don't squeeze or open it. Don't rub your eyes. This can irritate your eyes and let in bacteria.  If you need to touch your eyes, wash your hands first. Don't wear eye makeup or contact lenses until the area has healed.  GET HELP RIGHT AWAY IF:  Your symptoms do not improve. You develop blurred or loss of vision. Your symptoms worsen (increased discharge, pain or redness).   Thank you for choosing an e-visit.  Your e-visit answers were reviewed by a board certified advanced clinical practitioner to complete your personal care plan. Depending upon the condition, your plan could have included both over the counter or prescription medications.  Please review your pharmacy choice. Make sure the pharmacy is open so you can pick up prescription now. If there is a problem, you may contact your provider through [employer] and have the prescription routed to another pharmacy.  Your safety is important to [place]. If you have drug allergies check your prescription carefully.   For the next 24 hours you can use  MyChart to ask questions about today's visit, request a non-urgent call back, or ask for a work or school excuse. You will get an email in the next two days asking about your experience. I hope that your e-visit has been valuable and will speed your recovery.    have provided 5 minutes of non face to face time during this encounter for chart review and documentation.

## 2024-05-24 NOTE — Addendum Note (Signed)
 Addended by: Albertha Huger on: 05/24/2024 02:14 PM   Modules accepted: Orders

## 2024-06-13 ENCOUNTER — Other Ambulatory Visit: Payer: Self-pay | Admitting: Physician Assistant

## 2024-06-14 NOTE — Telephone Encounter (Signed)
 Continue gabapentin  300 mg, 1 p.o. 3 times daily. (She's only taking 1 at bedtime.)   Does she need a RF?

## 2024-06-15 NOTE — Telephone Encounter (Signed)
 LVM to [facility]

## 2024-06-30 ENCOUNTER — Ambulatory Visit: Admitting: Physician Assistant

## 2024-06-30 ENCOUNTER — Encounter: Payer: Self-pay | Admitting: Physician Assistant

## 2024-06-30 DIAGNOSIS — F411 Generalized anxiety disorder: Secondary | ICD-10-CM | POA: Diagnosis not present

## 2024-06-30 DIAGNOSIS — F3341 Major depressive disorder, recurrent, in partial remission: Secondary | ICD-10-CM | POA: Diagnosis not present

## 2024-06-30 DIAGNOSIS — F410 Panic disorder [episodic paroxysmal anxiety] without agoraphobia: Secondary | ICD-10-CM | POA: Diagnosis not present

## 2024-06-30 MED ORDER — ALPRAZOLAM 1 MG PO TABS: ORAL_TABLET | ORAL | 5 refills | Status: DC

## 2024-06-30 MED ORDER — GABAPENTIN 300 MG PO CAPS: ORAL_CAPSULE | ORAL | 1 refills | Status: AC

## 2024-06-30 MED ORDER — SERTRALINE HCL 100 MG PO TABS: 300.0000 mg | ORAL_TABLET | Freq: Every day | ORAL | 1 refills | Status: AC

## 2024-06-30 NOTE — Progress Notes (Signed)
 [facility] Check  Patient ID: Joanne Molina,  MRN: [id number]  PCP: Joanne Huxley, NP  Date of Evaluation: 06/30/2024 Time spent:20 minutes  Chief Complaint:  Chief Complaint   Anxiety; Depression; Follow-up    HISTORY/CURRENT STATUS: For routine 6 month med check.   I'm hanging in there. Her Mom's health continues to decline. On O2 all the time now. She and her stepdad take care of her. Also going through some things with her daughter. She was being bullied.Joanne Molina had to take her out of her school and she's now in private school and take away her social media. Her daughter is in counseling now.   Feels like meds are working as well as they can.  Energy and motivation are good most of the time.  Work is going well.   No extreme sadness, tearfulness, or feelings of hopelessness.  Sleeps ok. ADLs and personal hygiene are normal.   Denies any changes in concentration, making decisions, or remembering things.  Appetite has not changed.  Weight is stable.  She still has anxiety at times and needs the Xanax .  She does not have as many panic attacks as she wants to be it.  The Xanax  is helpful.  No mania, psychosis, or delirium.  Denies suicidal or homicidal thoughts.  Denies dizziness, syncope, seizures, numbness, tingling, tremor, tics, unsteady gait, slurred speech, confusion. Denies muscle or joint pain, stiffness, or dystonia.  Individual Medical History/ Review of Systems: Changes? :No     Past medications for mental health diagnoses include: Sonata , Paxil, Celexa, Effexor, BuSpar made the anxiety worse, Ativan , Klonopin , Prozac , Xanax , Ambien , Lunesta , trazodone , Propranolol  for anxiety changed to bisoprolol  b/c her hx of asthma  Allergies: Erythromycin  Current Medications:  Current Outpatient Medications:    albuterol  (VENTOLIN  HFA) 108 (90 Base) MCG/ACT inhaler, Inhale 1 puff into the lungs every 6 (six) hours as needed for wheezing or shortness of breath., Disp: 18 g,  Rfl: 11   bisoprolol  (ZEBETA ) 5 MG tablet, Take 1 tablet (5 mg total) by mouth daily., Disp: 90 tablet, Rfl: 3   budesonide -formoterol  (SYMBICORT ) 160-4.5 MCG/ACT inhaler, Inhale 2 puffs into the lungs 2 (two) times daily., Disp: 10.2 each, Rfl: 5   cetirizine  (ZYRTEC ) 10 MG tablet, Take 1 tablet (10 mg total) by mouth daily., Disp: 30 tablet, Rfl: 11   dicyclomine  (BENTYL ) 10 MG capsule, Take 1 capsule (10 mg total) by mouth 4 (four) times daily -  before meals and at bedtime., Disp: 90 capsule, Rfl: 2   Dupilumab  (DUPIXENT ) 300 MG/2ML SOAJ, Inject 300 mg into the skin every 14 (fourteen) days., Disp: 12 mL, Rfl: 1   famotidine  (PEPCID ) 40 MG tablet, Take 40 mg by mouth daily., Disp: , Rfl:    fluticasone  (FLONASE ) 50 MCG/ACT nasal spray, SPRAY 2 SPRAYS INTO EACH NOSTRIL EVERY DAY, Disp: 48 mL, Rfl: 2   montelukast  (SINGULAIR ) 10 MG tablet, Take 1 tablet (10 mg total) by mouth at bedtime., Disp: 90 tablet, Rfl: 3   ALPRAZolam  (XANAX ) 1 MG tablet, TAKE 1 TABLET BY MOUTH TWICE A DAY AS NEEDED FOR ANXIETY, Disp: 60 tablet, Rfl: 5   gabapentin  (NEURONTIN ) 300 MG capsule, TAKE 1 CAPSULE BY MOUTH THREE TIMES A DAY, Disp: 270 capsule, Rfl: 1   sertraline  (ZOLOFT ) 100 MG tablet, Take 3 tablets (300 mg total) by mouth daily., Disp: 270 tablet, Rfl: 1 Medication Side Effects: none  Family Medical/ Social History: Changes?  no  MENTAL HEALTH EXAM:  Last menstrual period 06/02/2019,  unknown if currently breastfeeding.There is no height or weight on file to calculate BMI.  General Appearance: Casual, Neat and Well Groomed  Eye Contact:  Good  Speech:  Clear and Coherent and Normal Rate  Volume:  Normal  Mood:  sad  Affect:  Congruent   Thought Process:  Goal Directed and Descriptions of Associations: Circumstantial  Orientation:  Full (Time, Place, and Person)  Thought Content: Logical   Suicidal Thoughts:  No  Homicidal Thoughts:  No  Memory:  WNL  Judgement:  Good  Insight:  Good  Psychomotor  Activity:  Normal  Concentration:  Concentration: Good and Attention Span: Good  Recall:  Good  Fund of Knowledge: Good  Language: Good  Assets:  Communication Skills Desire for Improvement Financial Resources/Insurance Housing Resilience Transportation Vocational/Educational  ADL's:  Intact  Cognition: WNL  Prognosis:  Good   DIAGNOSES:    ICD-10-CM   1. Recurrent major depressive disorder, in partial remission (HCC)  F33.41     2. Generalized anxiety disorder  F41.1     3. Panic disorder  F41.0       Receiving Psychotherapy: No    RECOMMENDATIONS:  PDMP was reviewed.  Xanax  last filled 06/05/2024.  Gabapentin  filled 05/15/2024. I provided approximately 20 minutes of face to face time during this encounter, including time spent before and after the visit in records review, medical decision making, counseling pertinent to today's visit, and charting.   [name] doing well with her mental health medications so no changes at this time.  Continue Xanax  1 mg, 1 p.o. twice daily as needed anxiety. Continue gabapentin  300 mg, 1 p.o. 3 times daily. (She's only taking 1 at bedtime.) Continue Zoloft  100 mg,  3 pills daily.  Return in 6 months.   Verneita Cooks, PA-C

## 2024-07-20 ENCOUNTER — Encounter: Payer: Self-pay | Admitting: Orthopaedic Surgery

## 2024-07-20 ENCOUNTER — Ambulatory Visit: Admitting: Orthopaedic Surgery

## 2024-07-20 DIAGNOSIS — M25552 Pain in left hip: Secondary | ICD-10-CM | POA: Diagnosis not present

## 2024-07-20 NOTE — Addendum Note (Signed)
 Addended by: Dhruvi Crenshaw on: 07/20/2024 11:23 AM   Modules accepted: Orders

## 2024-07-20 NOTE — Progress Notes (Signed)
 The patient is a 44 year old active female who comes in with over a years worth of back pain that used to radiate down her right leg and she does have a MRI back in 2021 that showed some mild stenosis at L4-L5 and L5-S1 more so to the right.  She never had any type of interventional therapy for that.  However more recently she has had problem going up and down stairs and feels weakness to the left leg that radiates all the way down her foot.  She says he has a really hard time lifting her left leg and she almost describes it as flexing the leg on the left side.  She denies any groin pain.  She says that leg is just weak and is different from her right leg.  He when she stands her left knee seems to hyperextend more when she stands but I did not feel any ligamentous instability of the left knee at all.  There is no left knee effusion.  Her left hip moves smoothly and fluidly with full range of motion.  There is just some mild pain over the left hip trochanteric area and the IT band.  She does have some weakness with foot dorsiflexion and plantarflexion of the left but it is only mild.  The first thing we need to do is actually send her to outpatient physical therapy and I explained this and she agrees as well.  We want to see if the therapist can help isolate muscle groups and determine where this weakness may correspond with her left lower extremity.  This may still be radicular in nature from her spine.  We may end up even ordering a new MRI of her lumbar spine.  I did look at the MRI imaging from her lumbar spine and a CT scan of the abdomen pelvis last year did show both hips and does appear normal.  We will send her to outpatient physical therapy for any modalities per therapist discretion especially involving the left lower extremity strengthening and hip strengthening.  Will then see her back in 4 weeks to see if that is helped and then go from there in terms of potentially other imaging studies.  At the  next visit we do need a standing AP and lateral of her lumbar spine.

## 2024-07-24 ENCOUNTER — Other Ambulatory Visit (INDEPENDENT_AMBULATORY_CARE_PROVIDER_SITE_OTHER): Payer: Self-pay | Admitting: Otolaryngology

## 2024-07-24 ENCOUNTER — Other Ambulatory Visit: Payer: Self-pay | Admitting: Primary Care

## 2024-08-02 NOTE — Therapy (Addendum)
 OUTPATIENT PHYSICAL THERAPY THORACOLUMBAR EVALUATION   Patient Name: Joanne Molina MRN: [id number] DOB:12/20/1980, 44 y.o., female Today's Date: 08/03/2024  END OF SESSION:  PT End of Session - 08/03/24 1259     Visit Number 1    Number of Visits 17    Date for PT Re-Evaluation 09/28/24    Authorization Type BCBS    PT Start Time 1145    PT Stop Time 1225    PT Time Calculation (min) 40 min    Activity Tolerance Patient tolerated treatment well    Behavior During Therapy [facility] for tasks assessed/performed          Past Medical History:  Diagnosis Date   Anxiety    panic attack   Cough variant asthma 05/14/2019   Depression    Gastropathy 10/05/2019   Migraine    S/P laparoscopic hysterectomy 06/29/2019   Past Surgical History:  Procedure Laterality Date   ABDOMINAL HYSTERECTOMY     BIOPSY  07/31/2023   Procedure: BIOPSY;  Surgeon: Unk Corinn Skiff, MD;  Location: [facility];  Service: Gastroenterology;;   CHOLECYSTECTOMY  10/02/2011   COLONOSCOPY WITH PROPOFOL  N/A 07/31/2023   Procedure: COLONOSCOPY WITH PROPOFOL ;  Surgeon: Unk Corinn Skiff, MD;  Location: [facility];  Service: Gastroenterology;  Laterality: N/A;   CYSTOSCOPY N/A 06/29/2019   Procedure: CYSTOSCOPY;  Surgeon: Delana Ted Morrison, DO;  Location: [facility];  Service: Gynecology;  Laterality: N/A;   ESOPHAGOGASTRODUODENOSCOPY (EGD) WITH PROPOFOL  N/A 07/31/2023   Procedure: ESOPHAGOGASTRODUODENOSCOPY (EGD) WITH PROPOFOL ;  Surgeon: Unk Corinn Skiff, MD;  Location: [facility];  Service: Gastroenterology;  Laterality: N/A;   TOTAL LAPAROSCOPIC HYSTERECTOMY WITH SALPINGECTOMY Bilateral 06/29/2019   Procedure: TOTAL LAPAROSCOPIC HYSTERECTOMY WITH SALPINGECTOMY, poss open;  Surgeon: Delana Ted Morrison, DO;  Location: [facility];  Service: Gynecology;  Laterality: Bilateral;  smoke evac rep will be here [place]   UPPER GI ENDOSCOPY  2015   Patient Active Problem  List   Diagnosis Date Noted   Chronic RUQ pain 07/31/2023   Irregular bowel habits 07/31/2023   Helicobacter pylori gastritis 05/14/2023   Abdominal pain 05/14/2023   Abnormal weight gain 05/14/2023   Abnormal weight loss 05/14/2023   Change in bowel habit 05/14/2023   Diarrhea 05/14/2023   Early satiety 05/14/2023   Epigastric pain 05/14/2023   Flatulence, eructation and gas pain 05/14/2023   Right lower quadrant pain 05/14/2023   Acid reflux 05/14/2023   Gastroesophageal reflux disease 05/14/2023   Recurrent sinusitis 04/30/2023   Acute sinusitis 10/14/2022   Hx of anorexia nervosa 10/14/2022   Chronic rhinitis 09/19/2021   Depression    Acute pain of right shoulder 01/30/2021   Body mass index (BMI) 28.0-28.9, adult 01/02/2021   Elevated blood-pressure reading, without diagnosis of hypertension 11/04/2020   Spinal stenosis, cervical region 09/02/2020   Lumbar back pain with radiculopathy affecting right lower extremity 09/02/2020   Gastropathy 10/05/2019   S/P laparoscopic hysterectomy 06/29/2019   Cough variant asthma 05/14/2019   Essential hypertension 05/14/2019   GAD (generalized anxiety disorder) 10/21/2018   MDD (major depressive disorder) 10/21/2018   Skin picking habit 10/21/2018   Insomnia 08/07/2018   Anxiety 02/12/2017   Gestational hypertension 07/12/2016   Gestational hypertension w/o significant proteinuria in 3rd trimester 07/12/2016   Urinary frequency 09/09/2012   Biliary colic 08/30/2011   BLURRED VISION 11/22/2007    PCP: Merna Huxley, NP   REFERRING PROVIDER: Vernetta Lonni GRADE, MD   REFERRING DIAG: [phone] (ICD-10-CM) - Pain  of left hip   Rationale for Evaluation and Treatment: Rehabilitation  THERAPY DIAG:  Radiculopathy, lumbar region  Pain in left hip  Muscle weakness (generalized)  Abnormal posture  ONSET DATE: Increasing over the last year  SUBJECTIVE:                                                                                                                                                                                            SUBJECTIVE STATEMENT: About a year of symptoms in the L LE. Pain can occur at the lateral side of the hip and knee.   With increased activity, she will get increased pain.  Main complaint is that the L LE  feels weak.  L foot feels like it trips her occasionally- no falls.     PERTINENT HISTORY:  + hx/o LBP.   PAIN:  Are you having pain? Yes: NPRS scale: 5/10 Pain location: lateral L LE/hip  Pain description: heavy, throbbing,weak Aggravating factors: stairs, increased activity/ambulation Relieving factors: rest  PRECAUTIONS: None  RED FLAGS: None   WEIGHT BEARING RESTRICTIONS: No  FALLS:  Has patient fallen in last 6 months? No  LIVING ENVIRONMENT: Lives with: lives with their family Lives in: House/apartment Stairs: Yes: Internal: 12 steps; 1 rail Has following equipment at home: None  OCCUPATION: professor   PLOF: Independent  PATIENT GOALS: Decrease pain and feel stronger  NEXT MD VISIT: 09/02/24  OBJECTIVE:  Note: Objective measures were completed at Evaluation unless otherwise noted.  DIAGNOSTIC FINDINGS:  No recent imaging  PATIENT SURVEYS:  PSFS: THE PATIENT SPECIFIC FUNCTIONAL SCALE  Place score of 0-10 (0 = unable to perform activity and 10 = able to perform activity at the same level as before injury or problem)  Activity Date:     Ascending stairs 8    2.walking without tripping 5    3.normal walking 7    4.      Total Score 6.66      Total Score = Sum of activity scores/number of activities  Minimally Detectable Change: 3 points (for single activity); 2 points (for average score)  [facility] (nd). The Patient Specific Functional Scale . Retrieved from [email or web address]   COGNITION: Overall cognitive status: Within functional limits for tasks  assessed     SENSATION: WFL  MUSCLE LENGTH: Hamstrings: Right 25% restricted  Left 50% restricted  POSTURE: decreased lumbar lordosis and l knee hyperextended, genu varum, L ASIS forward  PALPATION: L ASIS/PSIS  anterior, L piri 2+ and L QL 2+   LUMBAR ROM:   AROM eval  Flexion WNL  Extension WNL  Right lateral  flexion WNL  Left lateral flexion 75%  Right rotation 50%  Left rotation WNL   (Blank rows = not tested)  LOWER EXTREMITY ROM:     Passive  Right eval Left eval  Hip flexion  WNL throughout  Hip extension    Hip abduction    Hip adduction    Hip internal rotation    Hip external rotation    Knee flexion    Knee extension    Ankle dorsiflexion    Ankle plantarflexion    Ankle inversion    Ankle eversion     (Blank rows = not tested)  LOWER EXTREMITY MMT:    MMT Right eval Left eval  Hip flexion  4-  Hip extension    Hip abduction    Hip adduction  4+  Hip internal rotation    Hip external rotation    Knee flexion    Knee extension  4  Ankle dorsiflexion  4-  Ankle plantarflexion  4+  Ankle inversion    Ankle eversion     (Blank rows = not tested) Difficulty with toe and heel stand.  Unable to walk with either stance.   LUMBAR SPECIAL TESTS:  Straight leg raise test: Negative  FUNCTIONAL TESTS:  5 times sit to stand: 15 sec   GAIT: Distance walked: community distances Assistive device utilized: None Comments: Decreased DF in swing and at heel strike, L knee hyperextension in wt bearing  TREATMENT DATE:  08/03/24 Evaluation completed followed by instruction in HEP and manual MET for L anterior innominate                                                                                                                    PATIENT EDUCATION:  Education details: See below Person educated: Patient Education method: Explanation, Demonstration, Tactile cues, and Verbal cues Education comprehension: verbalized understanding, returned  demonstration, verbal cues required, and tactile cues required  HOME EXERCISE PROGRAM:  Access Code: [email or web address] URL: [email or web address] Date: 08/03/2024 Prepared by: Burnard Meth  Exercises - Supine Bridge  - 2 x daily - 7 x weekly - 2 sets - 10 reps - 4 hold - Supine Posterior Pelvic Tilt  - 2 x daily - 7 x weekly - 2 sets - 10 reps - 5 hold - Supine Double Knee to Chest  - 2 x daily - 7 x weekly - 1 sets - 3 reps - 25 hold   CLINICAL IMPRESSION: Patient is a 44 y.o. female who was seen today for physical therapy evaluation and treatment for L hip pain/LBP.  She presents with decreased spinal ROM, weakness in left hip and LE musculature, altered gait and pain.  She would benefit from skilled PT to address these deficits and return to previous LOA..   OBJECTIVE IMPAIRMENTS: decreased balance, difficulty walking, decreased ROM, decreased strength, increased muscle spasms, impaired flexibility, improper body mechanics, and pain.   ACTIVITY LIMITATIONS: standing, squatting, sleeping, stairs, and transfers  PARTICIPATION LIMITATIONS: school  PERSONAL FACTORS: n/a  REHAB POTENTIAL: Good  CLINICAL DECISION MAKING: Stable/uncomplicated  EVALUATION COMPLEXITY: Moderate   GOALS: Goals reviewed with patient? Yes  SHORT TERM GOALS: Target date: 08/31/2024    Pt to be independent with HEP. Baseline: Goal status: INITIAL  2.  Decrease pain by 1 level;. Baseline:  Goal status: INITIAL  LONG TERM GOALS: Target date: 09/28/2024    Increase spinal ROM by 25%. Baseline:  Goal status: INITIAL  2.  Increase strength of L LE to at least 4+ throughout.  Baseline:  Goal status: INITIAL  3.  Decrease max pain to 2/10 with all activities. Baseline:  Goal status: INITIAL  4.  Able to ascend 1 flight of stairs without use of handrail and no LOB or pain. Baseline:  Goal status: INITIAL  PLAN:  PT FREQUENCY: 2x/week  PT DURATION: 8 weeks  PLANNED INTERVENTIONS:  97164- PT Re-evaluation, 97110-Therapeutic exercises, 97530- Therapeutic activity, 97112- Neuromuscular re-education, 97535- Self Care, 02859- Manual therapy, [phone]- Gait training, [phone]- Aquatic Therapy, [phone]- Electrical stimulation (unattended), 97016- Vasopneumatic device, [id number]- Ultrasound, Patient/Family education, Balance training, Stair training, Joint mobilization, Cryotherapy, and Moist heat.  PLAN FOR NEXT SESSION: Start on there ex program, check alignment   Burnard CHRISTELLA Meth, PT 08/03/2024, 1:02 PM

## 2024-08-03 ENCOUNTER — Other Ambulatory Visit: Payer: Self-pay

## 2024-08-03 ENCOUNTER — Ambulatory Visit

## 2024-08-03 ENCOUNTER — Telehealth: Payer: Self-pay | Admitting: Physician Assistant

## 2024-08-03 DIAGNOSIS — M5416 Radiculopathy, lumbar region: Secondary | ICD-10-CM

## 2024-08-03 DIAGNOSIS — F411 Generalized anxiety disorder: Secondary | ICD-10-CM

## 2024-08-03 DIAGNOSIS — R293 Abnormal posture: Secondary | ICD-10-CM

## 2024-08-03 DIAGNOSIS — M6281 Muscle weakness (generalized): Secondary | ICD-10-CM

## 2024-08-03 DIAGNOSIS — M25552 Pain in left hip: Secondary | ICD-10-CM

## 2024-08-03 DIAGNOSIS — F410 Panic disorder [episodic paroxysmal anxiety] without agoraphobia: Secondary | ICD-10-CM

## 2024-08-03 MED ORDER — ALPRAZOLAM 1 MG PO TABS: ORAL_TABLET | ORAL | 5 refills | Status: AC

## 2024-08-03 NOTE — Telephone Encounter (Signed)
 Pended.

## 2024-08-03 NOTE — Telephone Encounter (Signed)
 Last appt was 06/30/24. Joanne Molina is requesting a refill on her Alprazolam  1 mg. Per pharmacy this medication will need to be coded properly. Pharmacy is:  [id number] - [place], [place] - [address]   Phone: [phone]  Fax: [phone]

## 2024-08-06 ENCOUNTER — Ambulatory Visit

## 2024-08-06 DIAGNOSIS — M25552 Pain in left hip: Secondary | ICD-10-CM

## 2024-08-06 DIAGNOSIS — M6281 Muscle weakness (generalized): Secondary | ICD-10-CM | POA: Diagnosis not present

## 2024-08-06 DIAGNOSIS — M5416 Radiculopathy, lumbar region: Secondary | ICD-10-CM | POA: Diagnosis not present

## 2024-08-06 DIAGNOSIS — R293 Abnormal posture: Secondary | ICD-10-CM | POA: Diagnosis not present

## 2024-08-06 NOTE — Therapy (Addendum)
 OUTPATIENT PHYSICAL THERAPY THORACOLUMBAR TREATMENT   Patient Name: Joanne Molina MRN: [id number] DOB:1980-05-24, 44 y.o., female Today's Date: 08/06/2024  END OF SESSION:  PT End of Session - 08/06/24 1646     Visit Number 2    Number of Visits 17    Date for PT Re-Evaluation 09/28/24    Authorization Type BCBS    PT Start Time 1557    PT Stop Time 1635    PT Time Calculation (min) 38 min    Activity Tolerance Patient tolerated treatment well    Behavior During Therapy [facility] for tasks assessed/performed           Past Medical History:  Diagnosis Date   Anxiety    panic attack   Cough variant asthma 05/14/2019   Depression    Gastropathy 10/05/2019   Migraine    S/P laparoscopic hysterectomy 06/29/2019   Past Surgical History:  Procedure Laterality Date   ABDOMINAL HYSTERECTOMY     BIOPSY  07/31/2023   Procedure: BIOPSY;  Surgeon: Unk Corinn Skiff, MD;  Location: [facility];  Service: Gastroenterology;;   CHOLECYSTECTOMY  10/02/2011   COLONOSCOPY WITH PROPOFOL  N/A 07/31/2023   Procedure: COLONOSCOPY WITH PROPOFOL ;  Surgeon: Unk Corinn Skiff, MD;  Location: [facility];  Service: Gastroenterology;  Laterality: N/A;   CYSTOSCOPY N/A 06/29/2019   Procedure: CYSTOSCOPY;  Surgeon: Delana Ted Morrison, DO;  Location: [facility];  Service: Gynecology;  Laterality: N/A;   ESOPHAGOGASTRODUODENOSCOPY (EGD) WITH PROPOFOL  N/A 07/31/2023   Procedure: ESOPHAGOGASTRODUODENOSCOPY (EGD) WITH PROPOFOL ;  Surgeon: Unk Corinn Skiff, MD;  Location: [facility];  Service: Gastroenterology;  Laterality: N/A;   TOTAL LAPAROSCOPIC HYSTERECTOMY WITH SALPINGECTOMY Bilateral 06/29/2019   Procedure: TOTAL LAPAROSCOPIC HYSTERECTOMY WITH SALPINGECTOMY, poss open;  Surgeon: Delana Ted Morrison, DO;  Location: [facility];  Service: Gynecology;  Laterality: Bilateral;  smoke evac rep will be here [place]   UPPER GI ENDOSCOPY  2015   Patient Active Problem  List   Diagnosis Date Noted   Chronic RUQ pain 07/31/2023   Irregular bowel habits 07/31/2023   Helicobacter pylori gastritis 05/14/2023   Abdominal pain 05/14/2023   Abnormal weight gain 05/14/2023   Abnormal weight loss 05/14/2023   Change in bowel habit 05/14/2023   Diarrhea 05/14/2023   Early satiety 05/14/2023   Epigastric pain 05/14/2023   Flatulence, eructation and gas pain 05/14/2023   Right lower quadrant pain 05/14/2023   Acid reflux 05/14/2023   Gastroesophageal reflux disease 05/14/2023   Recurrent sinusitis 04/30/2023   Acute sinusitis 10/14/2022   Hx of anorexia nervosa 10/14/2022   Chronic rhinitis 09/19/2021   Depression    Acute pain of right shoulder 01/30/2021   Body mass index (BMI) 28.0-28.9, adult 01/02/2021   Elevated blood-pressure reading, without diagnosis of hypertension 11/04/2020   Spinal stenosis, cervical region 09/02/2020   Lumbar back pain with radiculopathy affecting right lower extremity 09/02/2020   Gastropathy 10/05/2019   S/P laparoscopic hysterectomy 06/29/2019   Cough variant asthma 05/14/2019   Essential hypertension 05/14/2019   GAD (generalized anxiety disorder) 10/21/2018   MDD (major depressive disorder) 10/21/2018   Skin picking habit 10/21/2018   Insomnia 08/07/2018   Anxiety 02/12/2017   Gestational hypertension 07/12/2016   Gestational hypertension w/o significant proteinuria in 3rd trimester 07/12/2016   Urinary frequency 09/09/2012   Biliary colic 08/30/2011   BLURRED VISION 11/22/2007    PCP: Merna Huxley, NP   REFERRING PROVIDER: Vernetta Lonni GRADE, MD   REFERRING DIAG: [phone] (ICD-10-CM) -  Pain of left hip   Rationale for Evaluation and Treatment: Rehabilitation  THERAPY DIAG:  Radiculopathy, lumbar region  Pain in left hip  Muscle weakness (generalized)  Abnormal posture  ONSET DATE: Increasing over the last year  SUBJECTIVE:                                                                                                                                                                                            SUBJECTIVE STATEMENT: Pt reports some difficulty with bridges on the HEP.  Started to get soreness the day after HEP.    PERTINENT HISTORY:  + hx/o LBP.   PAIN:  Are you having pain? Yes: NPRS scale: 4/10 Pain location: lateral L LE/hip  Pain description: heavy, throbbing,weak Aggravating factors: stairs, increased activity/ambulation Relieving factors: rest  PRECAUTIONS: None  RED FLAGS: None   WEIGHT BEARING RESTRICTIONS: No  FALLS:  Has patient fallen in last 6 months? No  LIVING ENVIRONMENT: Lives with: lives with their family Lives in: House/apartment Stairs: Yes: Internal: 12 steps; 1 rail Has following equipment at home: None  OCCUPATION: professor   PLOF: Independent  PATIENT GOALS: Decrease pain and feel stronger  NEXT MD VISIT: 09/02/24  OBJECTIVE:  Note: Objective measures were completed at Evaluation unless otherwise noted.  DIAGNOSTIC FINDINGS:  No recent imaging  PATIENT SURVEYS:  PSFS: THE PATIENT SPECIFIC FUNCTIONAL SCALE  Place score of 0-10 (0 = unable to perform activity and 10 = able to perform activity at the same level as before injury or problem)  Activity Date:     Ascending stairs 8    2.walking without tripping 5    3.normal walking 7    4.      Total Score 6.66      Total Score = Sum of activity scores/number of activities  Minimally Detectable Change: 3 points (for single activity); 2 points (for average score)  [employer] [facility] (nd). The Patient Specific Functional Scale . Retrieved from [email or web address]   COGNITION: Overall cognitive status: Within functional limits for tasks assessed     SENSATION: WFL  MUSCLE LENGTH: Hamstrings: Right 25% restricted  Left 50% restricted  POSTURE: decreased lumbar lordosis and l knee hyperextended, genu  varum, L ASIS forward  PALPATION: L ASIS/PSIS  anterior, L piri 2+ and L QL 2+   LUMBAR ROM:   AROM eval  Flexion WNL  Extension WNL  Right lateral flexion WNL  Left lateral flexion 75%  Right rotation 50%  Left rotation WNL   (Blank rows = not tested)  LOWER EXTREMITY ROM:     Passive  Right eval Left  eval  Hip flexion  WNL throughout  Hip extension    Hip abduction    Hip adduction    Hip internal rotation    Hip external rotation    Knee flexion    Knee extension    Ankle dorsiflexion    Ankle plantarflexion    Ankle inversion    Ankle eversion     (Blank rows = not tested)  LOWER EXTREMITY MMT:    MMT Right eval Left eval  Hip flexion  4-  Hip extension    Hip abduction    Hip adduction  4+  Hip internal rotation    Hip external rotation    Knee flexion    Knee extension  4  Ankle dorsiflexion  4-  Ankle plantarflexion  4+  Ankle inversion    Ankle eversion     (Blank rows = not tested) Difficulty with toe and heel stand.  Unable to walk with either stance.   LUMBAR SPECIAL TESTS:  Straight leg raise test: Negative  FUNCTIONAL TESTS:  5 times sit to stand: 15 sec   GAIT: Distance walked: community distances Assistive device utilized: None Comments: Decreased DF in swing and at heel strike, L knee hyperextension in wt bearing  TREATMENT DATE:  08/06/24 8442-8364 Rec bike 6 min  level 0 Seated DF left ankle with Tband Red 3x10 Seated knee extension with manual resistance 2x5 Step ups 4 and 6 step on the LLE Supine PPT 3x10 4 sec hold Supine Marching with TA activation  SAQ 2x10 bilateral Increased gait analysis  08/03/24 Evaluation completed followed by instruction in HEP and manual MET for L anterior innominate                                                                                                                    PATIENT EDUCATION:  Education details: See below Person educated: Patient Education method: Explanation,  Demonstration, Tactile cues, and Verbal cues Education comprehension: verbalized understanding, returned demonstration, verbal cues required, and tactile cues required  HOME EXERCISE PROGRAM:  Access Code: [email or web address] URL: [email or web address] Date: 08/03/2024 Prepared by: Burnard Meth  Exercises - Supine Bridge  - 2 x daily - 7 x weekly - 2 sets - 10 reps - 4 hold - Supine Posterior Pelvic Tilt  - 2 x daily - 7 x weekly - 2 sets - 10 reps - 5 hold - Supine Double Knee to Chest  - 2 x daily - 7 x weekly - 1 sets - 3 reps - 25 hold Access Code: [email or web address] URL: [email or web address] Date: 08/06/2024 Prepared by: Burnard Meth  Exercises - Supine Bridge  - 2 x daily - 7 x weekly - 2 sets - 10 reps - 4 hold - Supine Posterior Pelvic Tilt  - 2 x daily - 7 x weekly - 2 sets - 10 reps - 5 hold - Supine Double Knee to Chest  - 2 x daily - 7 x weekly - 1 sets - 3 reps - 25 hold -  Supine March  - 2 x daily - 7 x weekly - 2 sets - 10 reps - 3 hold  CLINICAL IMPRESSION: Pt presents with decreased heel strike and push off due to weak DF on the left.  Pt states it gets worse after a long day  like today.  Added more strengthening exercises for DF and quadriceps on the left.  Pt demonstrated good form but fatigued quickly and needed extra time between sets.    OBJECTIVE IMPAIRMENTS: decreased balance, difficulty walking, decreased ROM, decreased strength, increased muscle spasms, impaired flexibility, improper body mechanics, and pain.   ACTIVITY LIMITATIONS: standing, squatting, sleeping, stairs, and transfers  PARTICIPATION LIMITATIONS: school  PERSONAL FACTORS: n/a  REHAB POTENTIAL: Good  CLINICAL DECISION MAKING: Stable/uncomplicated  EVALUATION COMPLEXITY: Moderate   GOALS: Goals reviewed with patient? Yes  SHORT TERM GOALS: Target date: 08/31/2024    Pt to be independent with HEP. Baseline: Goal status: INITIAL  2.  Decrease pain by 1  level;. Baseline:  Goal status: INITIAL  LONG TERM GOALS: Target date: 09/28/2024    Increase spinal ROM by 25%. Baseline:  Goal status: INITIAL  2.  Increase strength of L LE to at least 4+ throughout.  Baseline:  Goal status: INITIAL  3.  Decrease max pain to 2/10 with all activities. Baseline:  Goal status: INITIAL  4.  Able to ascend 1 flight of stairs without use of handrail and no LOB or pain. Baseline:  Goal status: INITIAL  PLAN:  PT FREQUENCY: 2x/week  PT DURATION: 8 weeks  PLANNED INTERVENTIONS: 97164- PT Re-evaluation, 97110-Therapeutic exercises, 97530- Therapeutic activity, 97112- Neuromuscular re-education, 97535- Self Care, 02859- Manual therapy, [phone]- Gait training, [phone]- Aquatic Therapy, [phone]- Electrical stimulation (unattended), 97016- Vasopneumatic device, [id number]- Ultrasound, Patient/Family education, Balance training, Stair training, Joint mobilization, Cryotherapy, and Moist heat.  PLAN FOR NEXT SESSION: Monitor L foot drop with ambulation.  Slow progression of L LE strengthening, spinal stabilization and check alignment.   Burnard CHRISTELLA Meth, PT 08/06/2024, 4:47 PM

## 2024-08-17 ENCOUNTER — Ambulatory Visit: Admitting: Physical Therapy

## 2024-08-17 ENCOUNTER — Encounter: Payer: Self-pay | Admitting: Physical Therapy

## 2024-08-17 DIAGNOSIS — R293 Abnormal posture: Secondary | ICD-10-CM | POA: Diagnosis not present

## 2024-08-17 DIAGNOSIS — M25552 Pain in left hip: Secondary | ICD-10-CM | POA: Diagnosis not present

## 2024-08-17 DIAGNOSIS — M6281 Muscle weakness (generalized): Secondary | ICD-10-CM | POA: Diagnosis not present

## 2024-08-17 DIAGNOSIS — M5416 Radiculopathy, lumbar region: Secondary | ICD-10-CM

## 2024-08-17 NOTE — Therapy (Signed)
 OUTPATIENT PHYSICAL THERAPY TREATMENT   Patient Name: Joanne Molina MRN: [id number] DOB:1980-07-10, 44 y.o., female Today's Date: 08/17/2024  END OF SESSION:  PT End of Session - 08/17/24 1436     Visit Number 3    Number of Visits 17    Date for PT Re-Evaluation 09/28/24    Authorization Type BCBS    PT Start Time 1432    PT Stop Time 1510    PT Time Calculation (min) 38 min    Activity Tolerance Patient tolerated treatment well    Behavior During Therapy [facility] for tasks assessed/performed            Past Medical History:  Diagnosis Date   Anxiety    panic attack   Cough variant asthma 05/14/2019   Depression    Gastropathy 10/05/2019   Migraine    S/P laparoscopic hysterectomy 06/29/2019   Past Surgical History:  Procedure Laterality Date   ABDOMINAL HYSTERECTOMY     BIOPSY  07/31/2023   Procedure: BIOPSY;  Surgeon: Unk Corinn Skiff, MD;  Location: [facility];  Service: Gastroenterology;;   CHOLECYSTECTOMY  10/02/2011   COLONOSCOPY WITH PROPOFOL  N/A 07/31/2023   Procedure: COLONOSCOPY WITH PROPOFOL ;  Surgeon: Unk Corinn Skiff, MD;  Location: [facility] ENDOSCOPY;  Service: Gastroenterology;  Laterality: N/A;   CYSTOSCOPY N/A 06/29/2019   Procedure: CYSTOSCOPY;  Surgeon: Delana Ted Morrison, DO;  Location: [facility];  Service: Gynecology;  Laterality: N/A;   ESOPHAGOGASTRODUODENOSCOPY (EGD) WITH PROPOFOL  N/A 07/31/2023   Procedure: ESOPHAGOGASTRODUODENOSCOPY (EGD) WITH PROPOFOL ;  Surgeon: Unk Corinn Skiff, MD;  Location: [facility] ENDOSCOPY;  Service: Gastroenterology;  Laterality: N/A;   TOTAL LAPAROSCOPIC HYSTERECTOMY WITH SALPINGECTOMY Bilateral 06/29/2019   Procedure: TOTAL LAPAROSCOPIC HYSTERECTOMY WITH SALPINGECTOMY, poss open;  Surgeon: Delana Ted Morrison, DO;  Location: [facility];  Service: Gynecology;  Laterality: Bilateral;  smoke evac rep will be here [place]   UPPER GI ENDOSCOPY  2015   Patient Active Problem List    Diagnosis Date Noted   Chronic RUQ pain 07/31/2023   Irregular bowel habits 07/31/2023   Helicobacter pylori gastritis 05/14/2023   Abdominal pain 05/14/2023   Abnormal weight gain 05/14/2023   Abnormal weight loss 05/14/2023   Change in bowel habit 05/14/2023   Diarrhea 05/14/2023   Early satiety 05/14/2023   Epigastric pain 05/14/2023   Flatulence, eructation and gas pain 05/14/2023   Right lower quadrant pain 05/14/2023   Acid reflux 05/14/2023   Gastroesophageal reflux disease 05/14/2023   Recurrent sinusitis 04/30/2023   Acute sinusitis 10/14/2022   Hx of anorexia nervosa 10/14/2022   Chronic rhinitis 09/19/2021   Depression    Acute pain of right shoulder 01/30/2021   Body mass index (BMI) 28.0-28.9, adult 01/02/2021   Elevated blood-pressure reading, without diagnosis of hypertension 11/04/2020   Spinal stenosis, cervical region 09/02/2020   Lumbar back pain with radiculopathy affecting right lower extremity 09/02/2020   Gastropathy 10/05/2019   S/P laparoscopic hysterectomy 06/29/2019   Cough variant asthma 05/14/2019   Essential hypertension 05/14/2019   GAD (generalized anxiety disorder) 10/21/2018   MDD (major depressive disorder) 10/21/2018   Skin picking habit 10/21/2018   Insomnia 08/07/2018   Anxiety 02/12/2017   Gestational hypertension 07/12/2016   Gestational hypertension w/o significant proteinuria in 3rd trimester 07/12/2016   Urinary frequency 09/09/2012   Biliary colic 08/30/2011   BLURRED VISION 11/22/2007    PCP: Merna Huxley, NP   REFERRING PROVIDER: Vernetta Lonni GRADE, MD   REFERRING DIAG: [phone] (ICD-10-CM) -  Pain of left hip  Rationale for Evaluation and Treatment: Rehabilitation  THERAPY DIAG:  Radiculopathy, lumbar region  Pain in left hip  Muscle weakness (generalized)  Abnormal posture  ONSET DATE: Increasing over the last year  SUBJECTIVE:                                                                                                                                                                                            SUBJECTIVE STATEMENT: Feels no better/no worse.  Symptoms are about the same.  PERTINENT HISTORY:  + hx/o LBP.   PAIN:  Are you having pain? Yes: NPRS scale: 5/10 Pain location: lateral L LE/hip  Pain description: heavy, throbbing,weak Aggravating factors: stairs, increased activity/ambulation Relieving factors: rest  PRECAUTIONS: None  RED FLAGS: None   WEIGHT BEARING RESTRICTIONS: No  FALLS:  Has patient fallen in last 6 months? No  LIVING ENVIRONMENT: Lives with: lives with their family Lives in: House/apartment Stairs: Yes: Internal: 12 steps; 1 rail Has following equipment at home: None  OCCUPATION: professor   PLOF: Independent  PATIENT GOALS: Decrease pain and feel stronger  NEXT MD VISIT: 09/02/24  OBJECTIVE:  Note: Objective measures were completed at Evaluation unless otherwise noted.  DIAGNOSTIC FINDINGS:  No recent imaging  PATIENT SURVEYS:  PSFS: THE PATIENT SPECIFIC FUNCTIONAL SCALE  Place score of 0-10 (0 = unable to perform activity and 10 = able to perform activity at the same level as before injury or problem)  Activity Date:     Ascending stairs 8    2.walking without tripping 5    3.normal walking 7    4.      Total Score 6.66      Total Score = Sum of activity scores/number of activities  Minimally Detectable Change: 3 points (for single activity); 2 points (for average score)  [facility] (nd). The Patient Specific Functional Scale . Retrieved from [email or web address]   COGNITION: Overall cognitive status: Within functional limits for tasks assessed     SENSATION: WFL  MUSCLE LENGTH: Hamstrings: Right 25% restricted  Left 50% restricted  POSTURE: decreased lumbar lordosis and l knee hyperextended, genu varum, L ASIS forward  PALPATION: L ASIS/PSIS   anterior, L piri 2+ and L QL 2+   LUMBAR ROM:   AROM eval  Flexion WNL  Extension WNL  Right lateral flexion WNL  Left lateral flexion 75%  Right rotation 50%  Left rotation WNL   (Blank rows = not tested)  LOWER EXTREMITY ROM:     Passive  Right eval Left eval  Hip flexion  WNL throughout  Hip extension  Hip abduction    Hip adduction    Hip internal rotation    Hip external rotation    Knee flexion    Knee extension    Ankle dorsiflexion    Ankle plantarflexion    Ankle inversion    Ankle eversion     (Blank rows = not tested)  LOWER EXTREMITY MMT:    MMT Right eval Left eval Left 08/17/24  Hip flexion  4- 4-/5  Hip extension     Hip abduction     Hip adduction  4+   Hip internal rotation     Hip external rotation     Knee flexion     Knee extension  4   Ankle dorsiflexion  4- 2-  Ankle plantarflexion  4+ 3  Ankle inversion     Ankle eversion      (Blank rows = not tested) Difficulty with toe and heel stand.  Unable to walk with either stance.   LUMBAR SPECIAL TESTS:  Straight leg raise test: Negative   FUNCTIONAL TESTS:  5 times sit to stand: 15 sec   GAIT: Distance walked: community distances Assistive device utilized: None Comments: Decreased DF in swing and at heel strike, L knee hyperextension in wt bearing  TREATMENT DATE:  08/17/24 TherEx Recumbent bike L3 x 5 min MMT assessment - see above for details Discussed trial of lumbar extension to see if symptoms centralize Prone hip extension x10 bil; 3 sec hold Prone press up x10 reps; 3 sec hold Supine isometric hip extension on Lt only 10x5 sec hold Hooklying isometric hip abduction 10x5 sec hold with strap Bridge with strap 10 x 5 sec hold; partial range   08/06/24 1557-1635 Rec bike 6 min  level 0 Seated DF left ankle with Tband Red 3x10 Seated knee extension with manual resistance 2x5 Step ups 4 and 6 step on the LLE Supine PPT 3x10 4 sec hold Supine Marching with TA  activation  SAQ 2x10 bilateral Increased gait analysis  08/03/24  Evaluation completed followed by instruction in HEP and manual MET for L anterior innominate                                                                                                                    PATIENT EDUCATION:  Education details: See below Person educated: Patient Education method: Explanation, Demonstration, Tactile cues, and Verbal cues Education comprehension: verbalized understanding, returned demonstration, verbal cues required, and tactile cues required  HOME EXERCISE PROGRAM: Access Code: [email or web address] URL: [email or web address] Date: 08/17/2024 Prepared by: Corean Ku  Exercises - Supine Bridge  - 2 x daily - 7 x weekly - 2 sets - 10 reps - 4 hold - Supine Posterior Pelvic Tilt  - 2 x daily - 7 x weekly - 2 sets - 10 reps - 5 hold - Supine Double Knee to Chest  - 2 x daily - 7 x weekly - 1 sets - 3 reps - 25 hold - Supine March  - 2 x daily -  7 x weekly - 2 sets - 10 reps - 3 hold - Standing Lumbar Extension  - 3-4 x daily - 7 x weekly - 1 sets - 10 reps - 5-10 sec hold  CLINICAL IMPRESSION: Trial of extension based exercises today to see if this helps with pain and symptoms.  Continued LLE weakness present.  Continue skilled PT.    OBJECTIVE IMPAIRMENTS: decreased balance, difficulty walking, decreased ROM, decreased strength, increased muscle spasms, impaired flexibility, improper body mechanics, and pain.   ACTIVITY LIMITATIONS: standing, squatting, sleeping, stairs, and transfers  PARTICIPATION LIMITATIONS: school  PERSONAL FACTORS: n/a  REHAB POTENTIAL: Good  CLINICAL DECISION MAKING: Stable/uncomplicated  EVALUATION COMPLEXITY: Moderate   GOALS: Goals reviewed with patient? Yes  SHORT TERM GOALS: Target date: 08/31/2024    Pt to be independent with HEP. Baseline: Goal status: INITIAL  2.  Decrease pain by 1 level;. Baseline:  Goal status:  INITIAL  LONG TERM GOALS: Target date: 09/28/2024    Increase spinal ROM by 25%. Baseline:  Goal status: INITIAL  2.  Increase strength of L LE to at least 4+ throughout.  Baseline:  Goal status: INITIAL  3.  Decrease max pain to 2/10 with all activities. Baseline:  Goal status: INITIAL  4.  Able to ascend 1 flight of stairs without use of handrail and no LOB or pain. Baseline:  Goal status: INITIAL  PLAN:  PT FREQUENCY: 2x/week  PT DURATION: 8 weeks  PLANNED INTERVENTIONS: 97164- PT Re-evaluation, 97110-Therapeutic exercises, 97530- Therapeutic activity, 97112- Neuromuscular re-education, 97535- Self Care, 02859- Manual therapy, [phone]- Gait training, [phone]- Aquatic Therapy, [phone]- Electrical stimulation (unattended), 97016- Vasopneumatic device, [id number]- Ultrasound, Patient/Family education, Balance training, Stair training, Joint mobilization, Cryotherapy, and Moist heat.  PLAN FOR NEXT SESSION: is extension helping?, Monitor L foot drop with ambulation.  Slow progression of L LE strengthening, spinal stabilization and check alignment.   Corean JULIANNA Ku, PT, DPT 08/17/24 3:13 PM

## 2024-08-19 ENCOUNTER — Ambulatory Visit: Admitting: Physical Therapy

## 2024-08-19 ENCOUNTER — Encounter: Payer: Self-pay | Admitting: Physical Therapy

## 2024-08-19 DIAGNOSIS — M5416 Radiculopathy, lumbar region: Secondary | ICD-10-CM

## 2024-08-19 DIAGNOSIS — M25552 Pain in left hip: Secondary | ICD-10-CM | POA: Diagnosis not present

## 2024-08-19 DIAGNOSIS — R293 Abnormal posture: Secondary | ICD-10-CM | POA: Diagnosis not present

## 2024-08-19 DIAGNOSIS — M6281 Muscle weakness (generalized): Secondary | ICD-10-CM | POA: Diagnosis not present

## 2024-08-19 NOTE — Therapy (Signed)
 OUTPATIENT PHYSICAL THERAPY TREATMENT   Patient Name: Joanne Molina MRN: [id number] DOB:1980/03/09, 44 y.o., female Today's Date: 08/19/2024  END OF SESSION:  PT End of Session - 08/19/24 1426     Visit Number 4    Number of Visits 17    Date for PT Re-Evaluation 09/28/24    Authorization Type BCBS    PT Start Time 1426    PT Stop Time 1505    PT Time Calculation (min) 39 min    Activity Tolerance Patient tolerated treatment well    Behavior During Therapy [facility] for tasks assessed/performed             Past Medical History:  Diagnosis Date   Anxiety    panic attack   Cough variant asthma 05/14/2019   Depression    Gastropathy 10/05/2019   Migraine    S/P laparoscopic hysterectomy 06/29/2019   Past Surgical History:  Procedure Laterality Date   ABDOMINAL HYSTERECTOMY     BIOPSY  07/31/2023   Procedure: BIOPSY;  Surgeon: Unk Corinn Skiff, MD;  Location: [facility] ENDOSCOPY;  Service: Gastroenterology;;   CHOLECYSTECTOMY  10/02/2011   COLONOSCOPY WITH PROPOFOL  N/A 07/31/2023   Procedure: COLONOSCOPY WITH PROPOFOL ;  Surgeon: Unk Corinn Skiff, MD;  Location: [facility] ENDOSCOPY;  Service: Gastroenterology;  Laterality: N/A;   CYSTOSCOPY N/A 06/29/2019   Procedure: CYSTOSCOPY;  Surgeon: Delana Ted Morrison, DO;  Location: [facility];  Service: Gynecology;  Laterality: N/A;   ESOPHAGOGASTRODUODENOSCOPY (EGD) WITH PROPOFOL  N/A 07/31/2023   Procedure: ESOPHAGOGASTRODUODENOSCOPY (EGD) WITH PROPOFOL ;  Surgeon: Unk Corinn Skiff, MD;  Location: [facility] ENDOSCOPY;  Service: Gastroenterology;  Laterality: N/A;   TOTAL LAPAROSCOPIC HYSTERECTOMY WITH SALPINGECTOMY Bilateral 06/29/2019   Procedure: TOTAL LAPAROSCOPIC HYSTERECTOMY WITH SALPINGECTOMY, poss open;  Surgeon: Delana Ted Morrison, DO;  Location: [facility];  Service: Gynecology;  Laterality: Bilateral;  smoke evac rep will be here [place]   UPPER GI ENDOSCOPY  2015   Patient Active Problem List    Diagnosis Date Noted   Chronic RUQ pain 07/31/2023   Irregular bowel habits 07/31/2023   Helicobacter pylori gastritis 05/14/2023   Abdominal pain 05/14/2023   Abnormal weight gain 05/14/2023   Abnormal weight loss 05/14/2023   Change in bowel habit 05/14/2023   Diarrhea 05/14/2023   Early satiety 05/14/2023   Epigastric pain 05/14/2023   Flatulence, eructation and gas pain 05/14/2023   Right lower quadrant pain 05/14/2023   Acid reflux 05/14/2023   Gastroesophageal reflux disease 05/14/2023   Recurrent sinusitis 04/30/2023   Acute sinusitis 10/14/2022   Hx of anorexia nervosa 10/14/2022   Chronic rhinitis 09/19/2021   Depression    Acute pain of right shoulder 01/30/2021   Body mass index (BMI) 28.0-28.9, adult 01/02/2021   Elevated blood-pressure reading, without diagnosis of hypertension 11/04/2020   Spinal stenosis, cervical region 09/02/2020   Lumbar back pain with radiculopathy affecting right lower extremity 09/02/2020   Gastropathy 10/05/2019   S/P laparoscopic hysterectomy 06/29/2019   Cough variant asthma 05/14/2019   Essential hypertension 05/14/2019   GAD (generalized anxiety disorder) 10/21/2018   MDD (major depressive disorder) 10/21/2018   Skin picking habit 10/21/2018   Insomnia 08/07/2018   Anxiety 02/12/2017   Gestational hypertension 07/12/2016   Gestational hypertension w/o significant proteinuria in 3rd trimester 07/12/2016   Urinary frequency 09/09/2012   Biliary colic 08/30/2011   BLURRED VISION 11/22/2007    PCP: Merna Huxley, NP   REFERRING PROVIDER: Vernetta Lonni GRADE, MD   REFERRING DIAG: [phone] (ICD-10-CM) -  Pain of left hip  Rationale for Evaluation and Treatment: Rehabilitation  THERAPY DIAG:  Radiculopathy, lumbar region  Pain in left hip  Muscle weakness (generalized)  Abnormal posture  ONSET DATE: Increasing over the last year  SUBJECTIVE:                                                                                                                                                                                            SUBJECTIVE STATEMENT: Doing the same, not sure she feels that this is helping.  Pain has gotten worse   PERTINENT HISTORY:  + hx/o LBP.   PAIN:  Are you having pain? Yes: NPRS scale: 5-6/10 Pain location: lateral L LE/hip  Pain description: heavy, throbbing,weak Aggravating factors: stairs, increased activity/ambulation Relieving factors: rest  PRECAUTIONS: None  RED FLAGS: None   WEIGHT BEARING RESTRICTIONS: No  FALLS:  Has patient fallen in last 6 months? No  LIVING ENVIRONMENT: Lives with: lives with their family Lives in: House/apartment Stairs: Yes: Internal: 12 steps; 1 rail Has following equipment at home: None  OCCUPATION: professor   PLOF: Independent  PATIENT GOALS: Decrease pain and feel stronger  NEXT MD VISIT: 09/02/24  OBJECTIVE:  Note: Objective measures were completed at Evaluation unless otherwise noted.  DIAGNOSTIC FINDINGS:  No recent imaging  PATIENT SURVEYS:  PSFS: THE PATIENT SPECIFIC FUNCTIONAL SCALE  Place score of 0-10 (0 = unable to perform activity and 10 = able to perform activity at the same level as before injury or problem)  Activity Date:  08/03/24    Ascending stairs 8    2.walking without tripping 5    3.normal walking 7    4.      Total Score 6.66      Total Score = Sum of activity scores/number of activities  Minimally Detectable Change: 3 points (for single activity); 2 points (for average score)  [facility] (nd). The Patient Specific Functional Scale . Retrieved from [email or web address]   COGNITION: Overall cognitive status: Within functional limits for tasks assessed     SENSATION: WFL  MUSCLE LENGTH: Hamstrings: Right 25% restricted  Left 50% restricted  POSTURE: decreased lumbar lordosis and l knee hyperextended, genu varum, L ASIS  forward  PALPATION: L ASIS/PSIS  anterior, L piri 2+ and L QL 2+   LUMBAR ROM:   AROM eval  Flexion WNL  Extension WNL  Right lateral flexion WNL  Left lateral flexion 75%  Right rotation 50%  Left rotation WNL   (Blank rows = not tested)  LOWER EXTREMITY ROM:     Passive  Right eval Left eval  Hip  flexion  WNL throughout  Hip extension    Hip abduction    Hip adduction    Hip internal rotation    Hip external rotation    Knee flexion    Knee extension    Ankle dorsiflexion    Ankle plantarflexion    Ankle inversion    Ankle eversion     (Blank rows = not tested)  LOWER EXTREMITY MMT:    MMT Right eval Left eval Left 08/17/24  Hip flexion  4- 4-/5  Hip extension     Hip abduction     Hip adduction  4+   Hip internal rotation     Hip external rotation     Knee flexion     Knee extension  4   Ankle dorsiflexion  4- 2-  Ankle plantarflexion  4+ 3  Ankle inversion     Ankle eversion      (Blank rows = not tested) Difficulty with toe and heel stand.  Unable to walk with either stance.   LUMBAR SPECIAL TESTS:  Straight leg raise test: Negative   FUNCTIONAL TESTS:  5 times sit to stand: 15 sec   GAIT: Distance walked: community distances Assistive device utilized: None Comments: Decreased DF in swing and at heel strike, L knee hyperextension in wt bearing  TREATMENT DATE:  08/19/24 TherEx Recumbent bike L3 x 5 min Lt ankle DF and eversion L3 band x 15-20 reps (very limited motion)  TherAct Leg press bil 75# 2x10; then single limb LLE only 25# 2x10 Sidelying LLE hip circles x10 reps CW/CCW - limited tolerance to activity Sidelying clams LLE x10 reps Bridges 10 x 5 sec   Self care Discussion with pt about current symptoms and lack of progress.  Recommend she reach out to MD to discuss next steps.     08/17/24 TherEx Recumbent bike L3 x 5 min MMT assessment - see above for details Discussed trial of lumbar extension to see if symptoms  centralize Prone hip extension x10 bil; 3 sec hold Prone press up x10 reps; 3 sec hold Supine isometric hip extension on Lt only 10x5 sec hold Hooklying isometric hip abduction 10x5 sec hold with strap Bridge with strap 10 x 5 sec hold; partial range   08/06/24 1557-1635 Rec bike 6 min  level 0 Seated DF left ankle with Tband Red 3x10 Seated knee extension with manual resistance 2x5 Step ups 4 and 6 step on the LLE Supine PPT 3x10 4 sec hold Supine Marching with TA activation  SAQ 2x10 bilateral Increased gait analysis  08/03/24  Evaluation completed followed by instruction in HEP and manual MET for L anterior innominate                                                                                                                    PATIENT EDUCATION:  Education details: See below Person educated: Patient Education method: Explanation, Demonstration, Tactile cues, and Verbal cues Education comprehension: verbalized understanding, returned demonstration, verbal cues required, and  tactile cues required  HOME EXERCISE PROGRAM: Access Code: [email or web address] URL: [email or web address] Date: 08/17/2024 Prepared by: Corean Ku  Exercises - Supine Bridge  - 2 x daily - 7 x weekly - 2 sets - 10 reps - 4 hold - Supine Posterior Pelvic Tilt  - 2 x daily - 7 x weekly - 2 sets - 10 reps - 5 hold - Supine Double Knee to Chest  - 2 x daily - 7 x weekly - 1 sets - 3 reps - 25 hold - Supine March  - 2 x daily - 7 x weekly - 2 sets - 10 reps - 3 hold - Standing Lumbar Extension  - 3-4 x daily - 7 x weekly - 1 sets - 10 reps - 5-10 sec hold  CLINICAL IMPRESSION: Pt continues to have foot drag due to LLE weakness and pain.  She also has limited tolerance to very simple exercises making it difficult for her to participate in PT.  At this time will reach out to MD to see if additional testing is indicated.   OBJECTIVE IMPAIRMENTS: decreased balance, difficulty walking, decreased  ROM, decreased strength, increased muscle spasms, impaired flexibility, improper body mechanics, and pain.   ACTIVITY LIMITATIONS: standing, squatting, sleeping, stairs, and transfers  PARTICIPATION LIMITATIONS: school  PERSONAL FACTORS: n/a  REHAB POTENTIAL: Good  CLINICAL DECISION MAKING: Stable/uncomplicated  EVALUATION COMPLEXITY: Moderate   GOALS: Goals reviewed with patient? Yes  SHORT TERM GOALS: Target date: 08/31/2024    Pt to be independent with HEP. Baseline: Goal status: MET 08/19/24  2.  Decrease pain by 1 level;. Baseline:  Goal status: ongoing 08/19/24  LONG TERM GOALS: Target date: 09/28/2024    Increase spinal ROM by 25%. Baseline:  Goal status: ONGOING 08/19/24  2.  Increase strength of L LE to at least 4+ throughout.  Baseline:  Goal status: ONGOING 08/19/24  3.  Decrease max pain to 2/10 with all activities. Baseline:  Goal status: ONGOING 08/19/24  4.  Able to ascend 1 flight of stairs without use of handrail and no LOB or pain. Baseline:  Goal status: ONGOING 08/19/24  PLAN:  PT FREQUENCY: 2x/week  PT DURATION: 8 weeks  PLANNED INTERVENTIONS: 97164- PT Re-evaluation, 97110-Therapeutic exercises, 97530- Therapeutic activity, 97112- Neuromuscular re-education, 97535- Self Care, 02859- Manual therapy, [phone]- Gait training, [phone]- Aquatic Therapy, [phone]- Electrical stimulation (unattended), 97016- Vasopneumatic device, [id number]- Ultrasound, Patient/Family education, Balance training, Stair training, Joint mobilization, Cryotherapy, and Moist heat.  PLAN FOR NEXT SESSION: see what Vernetta recommended if she returns,  is extension helping?, Monitor L foot drop with ambulation.  Slow progression of L LE strengthening, spinal stabilization and check alignment.   Corean JULIANNA Ku, PT, DPT 08/19/24 3:08 PM

## 2024-08-20 ENCOUNTER — Encounter: Payer: Self-pay | Admitting: Orthopaedic Surgery

## 2024-08-20 ENCOUNTER — Other Ambulatory Visit: Payer: Self-pay

## 2024-08-20 DIAGNOSIS — M5412 Radiculopathy, cervical region: Secondary | ICD-10-CM

## 2024-08-24 ENCOUNTER — Encounter

## 2024-08-27 ENCOUNTER — Encounter: Admitting: Rehabilitative and Restorative Service Providers"

## 2024-09-01 ENCOUNTER — Encounter: Admitting: Rehabilitative and Restorative Service Providers"

## 2024-09-02 ENCOUNTER — Encounter: Payer: Self-pay | Admitting: Orthopaedic Surgery

## 2024-09-02 ENCOUNTER — Other Ambulatory Visit (INDEPENDENT_AMBULATORY_CARE_PROVIDER_SITE_OTHER): Payer: Self-pay

## 2024-09-02 ENCOUNTER — Ambulatory Visit: Admitting: Orthopaedic Surgery

## 2024-09-02 DIAGNOSIS — G8929 Other chronic pain: Secondary | ICD-10-CM | POA: Diagnosis not present

## 2024-09-02 DIAGNOSIS — M5441 Lumbago with sciatica, right side: Secondary | ICD-10-CM | POA: Diagnosis not present

## 2024-09-02 DIAGNOSIS — M21372 Foot drop, left foot: Secondary | ICD-10-CM

## 2024-09-02 DIAGNOSIS — M5442 Lumbago with sciatica, left side: Secondary | ICD-10-CM | POA: Diagnosis not present

## 2024-09-02 NOTE — Progress Notes (Signed)
 The patient is following up as a relates to significant low back pain and sciatica involving her left side.  She has developed foot drop as well.  She has been through physical therapy which was quite painful to her and she is not really making any progress.  She feels like the left leg and foot are dragging now.  Apparently she does have nerve conduction studies coming up next week.  She denies any change in bowel or bladder function but does report again weakness of the left lower extremity.  NuPrep on exam she does have pain with flexion and extension of the lumbar spine but no blocks to flexion and extension.  Her pain is bilaterally in the lower back.  She does have a positive straight leg raise to the left side and she does have significant weakness with left foot dorsiflexion.  2 views of the lumbar spine show no acute findings and no significant malalignment.  At this point a MRI of the lumbar spine is warranted given the failure of conservative treatment including rest, anti-inflammatories, time and outpatient physical therapy.  Also due to the fact that now she is experiencing significant weakness in radicular symptoms going down her left leg, a MRI is warranted medically to rule out nerve compression.  We will see her back in follow-up once we have the MRI of her lumbar spine.  I did give her prescription for [employer] for a left AFO in the interim.

## 2024-09-03 ENCOUNTER — Other Ambulatory Visit: Payer: Self-pay

## 2024-09-03 ENCOUNTER — Encounter

## 2024-09-03 DIAGNOSIS — M21372 Foot drop, left foot: Secondary | ICD-10-CM

## 2024-09-03 DIAGNOSIS — G8929 Other chronic pain: Secondary | ICD-10-CM

## 2024-09-07 ENCOUNTER — Encounter

## 2024-09-07 ENCOUNTER — Encounter: Payer: Self-pay | Admitting: Orthopaedic Surgery

## 2024-09-08 ENCOUNTER — Ambulatory Visit (INDEPENDENT_AMBULATORY_CARE_PROVIDER_SITE_OTHER): Admitting: Physical Medicine and Rehabilitation

## 2024-09-08 DIAGNOSIS — R202 Paresthesia of skin: Secondary | ICD-10-CM | POA: Diagnosis not present

## 2024-09-08 DIAGNOSIS — R29898 Other symptoms and signs involving the musculoskeletal system: Secondary | ICD-10-CM | POA: Diagnosis not present

## 2024-09-08 DIAGNOSIS — M5442 Lumbago with sciatica, left side: Secondary | ICD-10-CM

## 2024-09-08 DIAGNOSIS — G8929 Other chronic pain: Secondary | ICD-10-CM

## 2024-09-08 NOTE — Progress Notes (Signed)
 Pain Scale   Average Pain 4 Patient advising she has left leg weakness and numbness to foot, patient advising she also has pain in her lower back Patient is right hand dominate        +Driver, -BT, -Dye Allergies.

## 2024-09-10 ENCOUNTER — Encounter

## 2024-09-10 ENCOUNTER — Telehealth: Payer: Self-pay

## 2024-09-10 NOTE — Telephone Encounter (Signed)
 Spoke with patient and she thought she had cancelled all PT appointments after her discussion with MD.  She has an MRI scheduled soon and I told her if after discussing the results with her doctor she needs our services to just call us  back.

## 2024-09-13 ENCOUNTER — Ambulatory Visit
Admission: RE | Admit: 2024-09-13 | Discharge: 2024-09-13 | Disposition: A | Discharge status: Home / Self Care | Source: Ambulatory Visit | Attending: Orthopaedic Surgery | Admitting: Orthopaedic Surgery

## 2024-09-13 DIAGNOSIS — M21372 Foot drop, left foot: Secondary | ICD-10-CM

## 2024-09-13 DIAGNOSIS — G8929 Other chronic pain: Secondary | ICD-10-CM

## 2024-09-14 NOTE — Procedures (Signed)
 EMG & NCV Findings: All nerve conduction studies (as indicated in the following tables) were within normal limits.    All examined muscles (as indicated in the following table) showed no evidence of electrical instability.    Impression: Essentially NORMAL electrodiagnostic study of the left lower limb.  There is no significant electrodiagnostic evidence of nerve entrapment, lumbosacral plexopathy or lumbar radiculopathy.    As you know, purely sensory or demyelinating radiculopathies and chemical radiculitis may not be detected with this particular electrodiagnostic study.  This test would also not see any central nervous system lesions.  Recommendations: 1.  Follow-up with referring physician.  Likely needs neurology workup. 2.  Continue current management of symptoms.  ___________________________ Prentice Masters FAAPMR Board Certified, American Board of Physical Medicine and Rehabilitation    Nerve Conduction Studies Anti Sensory Summary Table   Stim Site NR Peak (ms) Norm Peak (ms) P-T Amp (V) Norm P-T Amp Site1 Site2 Delta-P (ms) Dist (cm) Vel (m/s) Norm Vel (m/s)  Left Saphenous Anti Sensory (Ant Med Mall)  27.8C  14cm    3.7 <4.4 7.6 >2 14cm Ant Med Mall 3.7 0.0  >32  Left Sup Fibular Anti Sensory (Ant Lat Mall)  27.9C  14 cm    3.6 <4.4 12.3 >5.0 14 cm Ant Lat Mall 3.6 14.0 39 >32  Left Sural Anti Sensory (Lat Mall)  27.8C  Calf    3.6 <4.0 12.6 >5.0 Calf Lat Mall 3.6 14.0 39 >35   Motor Summary Table   Stim Site NR Onset (ms) Norm Onset (ms) O-P Amp (mV) Norm O-P Amp Site1 Site2 Delta-0 (ms) Dist (cm) Vel (m/s) Norm Vel (m/s)  Left Fibular Motor (Ext Dig Brev)  28.1C  Ankle    3.9 <6.1 2.6 >2.5 B Fib Ankle 6.5 31.0 48 >38  B Fib    10.4  1.8  Poplt B Fib 1.7 10.0 59 >40  Poplt    12.1  2.5         Left Tibial Motor (Abd Hall Brev)  28C  Ankle    4.0 <6.1 14.8 >3.0 Knee Ankle 8.1 39.0 48 >35  Knee    12.1  10.7          EMG   Side Muscle Nerve Root Ins Act Fibs  Psw Amp Dur Poly Recrt Int Bruna Comment  Left AntTibialis Dp Br Peron L4-5 Nml Nml Nml Nml Nml 0 Nml Nml   Left Fibularis Longus  Sup Br Peron L5-S1 Nml Nml Nml Nml Nml 0 Nml Nml   Left MedGastroc Tibial S1-2 Nml Nml Nml Nml Nml 0 Nml Nml   Left VastusMed Femoral L2-4 Nml Nml Nml Nml Nml 0 Nml Nml   Left BicepsFemS Sciatic L5-S1 Nml Nml Nml Nml Nml 0 Nml Nml     Nerve Conduction Studies Anti Sensory Left/Right Comparison   Stim Site L Lat (ms) R Lat (ms) L-R Lat (ms) L Amp (V) R Amp (V) L-R Amp (%) Site1 Site2 L Vel (m/s) R Vel (m/s) L-R Vel (m/s)  Saphenous Anti Sensory (Ant Med Mall)  27.8C  14cm 3.7   7.6   14cm Ant Med Mall     Sup Fibular Anti Sensory (Ant Lat Mall)  27.9C  14 cm 3.6   12.3   14 cm Ant Lat Mall 39    Sural Anti Sensory (Lat Mall)  27.8C  Calf 3.6   12.6   Calf Lat Mall 39     Motor Left/Right Comparison  Stim Site L Lat (ms) R Lat (ms) L-R Lat (ms) L Amp (mV) R Amp (mV) L-R Amp (%) Site1 Site2 L Vel (m/s) R Vel (m/s) L-R Vel (m/s)  Fibular Motor (Ext Dig Brev)  28.1C  Ankle 3.9   2.6   B Fib Ankle 48    B Fib 10.4   1.8   Poplt B Fib 59    Poplt 12.1   2.5         Tibial Motor (Abd Hall Brev)  28C  Ankle 4.0   14.8   Knee Ankle 48    Knee 12.1   10.7            Waveforms:

## 2024-09-20 ENCOUNTER — Encounter: Payer: Self-pay | Admitting: Physical Medicine and Rehabilitation

## 2024-09-20 NOTE — Progress Notes (Signed)
 Joanne Molina - 44 y.o. female MRN [id number]  Date of birth: [birth year]  Office Visit Note: Visit Date: 09/08/2024 PCP: Merna Huxley, NP Referred by: Vernetta Lonni GRADE*  Subjective: Chief Complaint  Patient presents with   Left Leg - Numbness, Weakness   HPI: Joanne Molina is a 44 y.o. female who comes in today at the request of Dr. Lonni Vernetta for evaluation and management of chronic, worsening and severe paresthesia and weakness of the left lower extremity.  Patient is Right hand dominant.  She reports a chronic history of right sided hip and leg pain which had resolved over the years she had had MRI findings of mild changes at that point.  She started seeing Dr. Vernetta for this current condition in July and it had been present for some time even then.  She reports no specific injury.  She gets some pain in the left lower back buttock and down the leg into the foot with paresthesia.  Her biggest finding has been significant weakness where she reports dragging her left foot now.  When she walks she does not really walk with a steppage gait or circumduction or slapping but it is a slow sliding gait.  She denies any symptoms on the right.  The weakness is in both dorsiflexion plantarflexion and even knee extension.  Dr. Vernetta was concerned enough to get new MRI and this is reviewed below in full but is essentially normal for her age.  There is no focal nerve compression.  She does have a history of prior cervical ACDF at C5-6.  MRI from 24 does not show any focal nerve compression or spinal cord lesion.  She has no history of central nervous system disorder such as multiple sclerosis etc.  She does have a history of depression anxiety and panic disorder.  She does not have a history of lumbar spine surgery.  She has been doing physical therapy which seem to just make things worse and she was not really getting any improvement.  She has not had prior electrodiagnostic studies.  She  is not diabetic.   I spent more than 30 minutes speaking face-to-face with the patient with 50% of the time in counseling and discussing coordination of care.      Review of Systems  Musculoskeletal:  Positive for back pain, joint pain and neck pain.  Neurological:  Positive for tingling and focal weakness.  All other systems reviewed and are negative.  Otherwise per HPI.  Assessment & Plan: Visit Diagnoses:    ICD-10-CM   1. Paresthesia of skin  R20.2 NCV with EMG (electromyography)    2. Weakness of left foot  R29.898     3. Left leg weakness  R29.898     4. Chronic left-sided low back pain with left-sided sciatica  M54.42    G89.29        Plan: Impression: Essentially NORMAL electrodiagnostic study of the left lower limb.  There is no significant electrodiagnostic evidence of nerve entrapment, lumbosacral plexopathy or lumbar radiculopathy.    As you know, purely sensory or demyelinating radiculopathies and chemical radiculitis may not be detected with this particular electrodiagnostic study.  This test would also not see any central nervous system lesions.  Recommendations: 1.  Follow-up with referring physician.  Likely needs neurology workup. 2.  Continue current management of symptoms.  Meds & Orders: No orders of the defined types were placed in this encounter.   Orders Placed This Encounter  Procedures  NCV with EMG (electromyography)    Follow-up: Return for Lonni Poli, MD.   Procedures: No procedures performed  EMG & NCV Findings: All nerve conduction studies (as indicated in the following tables) were within normal limits.    All examined muscles (as indicated in the following table) showed no evidence of electrical instability.    Impression: Essentially NORMAL electrodiagnostic study of the left lower limb.  There is no significant electrodiagnostic evidence of nerve entrapment, lumbosacral plexopathy or lumbar radiculopathy.    As you  know, purely sensory or demyelinating radiculopathies and chemical radiculitis may not be detected with this particular electrodiagnostic study.  This test would also not see any central nervous system lesions.  Recommendations: 1.  Follow-up with referring physician.  Likely needs neurology workup. 2.  Continue current management of symptoms.  ___________________________ Prentice Masters FAAPMR Board Certified, American Board of Physical Medicine and Rehabilitation    Nerve Conduction Studies Anti Sensory Summary Table   Stim Site NR Peak (ms) Norm Peak (ms) P-T Amp (V) Norm P-T Amp Site1 Site2 Delta-P (ms) Dist (cm) Vel (m/s) Norm Vel (m/s)  Left Saphenous Anti Sensory (Ant Med Mall)  27.8C  14cm    3.7 <4.4 7.6 >2 14cm Ant Med Mall 3.7 0.0  >32  Left Sup Fibular Anti Sensory (Ant Lat Mall)  27.9C  14 cm    3.6 <4.4 12.3 >5.0 14 cm Ant Lat Mall 3.6 14.0 39 >32  Left Sural Anti Sensory (Lat Mall)  27.8C  Calf    3.6 <4.0 12.6 >5.0 Calf Lat Mall 3.6 14.0 39 >35   Motor Summary Table   Stim Site NR Onset (ms) Norm Onset (ms) O-P Amp (mV) Norm O-P Amp Site1 Site2 Delta-0 (ms) Dist (cm) Vel (m/s) Norm Vel (m/s)  Left Fibular Motor (Ext Dig Brev)  28.1C  Ankle    3.9 <6.1 2.6 >2.5 B Fib Ankle 6.5 31.0 48 >38  B Fib    10.4  1.8  Poplt B Fib 1.7 10.0 59 >40  Poplt    12.1  2.5         Left Tibial Motor (Abd Hall Brev)  28C  Ankle    4.0 <6.1 14.8 >3.0 Knee Ankle 8.1 39.0 48 >35  Knee    12.1  10.7          EMG   Side Muscle Nerve Root Ins Act Fibs Psw Amp Dur Poly Recrt Int Bruna Comment  Left AntTibialis Dp Br Peron L4-5 Nml Nml Nml Nml Nml 0 Nml Nml   Left Fibularis Longus  Sup Br Peron L5-S1 Nml Nml Nml Nml Nml 0 Nml Nml   Left MedGastroc Tibial S1-2 Nml Nml Nml Nml Nml 0 Nml Nml   Left VastusMed Femoral L2-4 Nml Nml Nml Nml Nml 0 Nml Nml   Left BicepsFemS Sciatic L5-S1 Nml Nml Nml Nml Nml 0 Nml Nml     Nerve Conduction Studies Anti Sensory Left/Right Comparison   Stim  Site L Lat (ms) R Lat (ms) L-R Lat (ms) L Amp (V) R Amp (V) L-R Amp (%) Site1 Site2 L Vel (m/s) R Vel (m/s) L-R Vel (m/s)  Saphenous Anti Sensory (Ant Med Mall)  27.8C  14cm 3.7   7.6   14cm Ant Med Mall     Sup Fibular Anti Sensory (Ant Lat Mall)  27.9C  14 cm 3.6   12.3   14 cm Ant Lat Mall 39    Sural Anti Sensory (Lat Mall)  27.8C  Calf 3.6   12.6   Calf Lat Mall 39     Motor Left/Right Comparison   Stim Site L Lat (ms) R Lat (ms) L-R Lat (ms) L Amp (mV) R Amp (mV) L-R Amp (%) Site1 Site2 L Vel (m/s) R Vel (m/s) L-R Vel (m/s)  Fibular Motor (Ext Dig Brev)  28.1C  Ankle 3.9   2.6   B Fib Ankle 48    B Fib 10.4   1.8   Poplt B Fib 59    Poplt 12.1   2.5         Tibial Motor (Abd Hall Brev)  28C  Ankle 4.0   14.8   Knee Ankle 48    Knee 12.1   10.7            Waveforms:            Clinical History: MR LUMBAR SPINE WITHOUT IV CONTRAST   COMPARISON: None available   CLINICAL HISTORY: Spinal stenosis. Left foot drop. Low back pain with left lower extremity weakness.   TECHNIQUE: SAG T2, SAG T1, SAG STIR, AX T2, AX T1 without IV contrast.   FINDINGS: There is normal alignment of the lumbar spine. There is no vertebral body height loss, subluxation or marrow replacing process. The sacrum and SI joints are unremarkable so far as visualized. Conus and cauda equina are unremarkable.   T12-L1: There is no focal disc protrusion, foraminal or spinal stenosis. Mild facet arthrosis.   L1-2: There is no focal disc protrusion, foraminal or spinal stenosis. Mild facet arthrosis.   L2-3: There is no focal disc protrusion, foraminal or spinal stenosis. Mild-to-moderate facet arthrosis.   L3-4: There is no focal disc protrusion, foraminal or spinal stenosis. Mild-to-moderate facet arthrosis.   L4-5: Mild broad-based bulge slightly effacing the ventral thecal sac. There is crowding of the descending nerve roots in the lateral recess without impingement. Mild caudal  foraminal narrowing is present, right slightly greater than left. No significant foraminal stenosis. Moderate facet arthrosis is present.   L5-S1: Disc desiccation with moderate facet arthrosis bilaterally. No significant spinal or foraminal stenosis is appreciated. There is a small annular tear in the right foramen.   The retroperitoneal structures demonstrate no significant abnormality.   IMPRESSION: Mild disc desiccation facet arthrosis. There is no focal extrusion, foraminal or spinal stenosis. Mild caudal foraminal narrowing is present at L4-5 without significant foraminal or spinal stenosis.   Electronically signed by: Norleen Satchel MD 09/15/2024   She reports that she has never smoked. She has been exposed to tobacco smoke. She has never used smokeless tobacco. No results for input(s): HGBA1C, LABURIC in the last 8760 hours.  Objective:  VS:  HT:    WT:   BMI:     BP:   HR: bpm  TEMP: ( )  RESP:  Physical Exam Vitals and nursing note reviewed.  Constitutional:      General: [name] not in acute distress.    Appearance: Normal appearance. [name] well-developed. [name] not ill-appearing.  HENT:     Head: Normocephalic and atraumatic.  Eyes:     Conjunctiva/sclera: Conjunctivae normal.     Pupils: Pupils are equal, round, and reactive to light.  Cardiovascular:     Rate and Rhythm: Normal rate.     Pulses: Normal pulses.  Pulmonary:     Effort: Pulmonary effort is normal.  Musculoskeletal:        General: Tenderness present.     Right lower  leg: No edema.     Left lower leg: No edema.     Comments: Patient's gait is interesting and that is not the usual gait you see with  foot drop or dorsiflexion weakness.  It is more of a sliding foot gait on the left.  No circumduction or hip hiking excetra.  On exam she has difficulty given effort even with knee extension and knee flexion in particular dorsiflexion and plantarflexion.  Nerve pattern of weakness is global and  crosses many peripheral nerve distributions as well as many lumbar roots.  She has no weakness on the right.  She has no atrophy of the muscles bilaterally.  There is no sign of trophic changes or increased sweating or discoloration.  Pulses are normal.  Skin:    General: Skin is warm and dry.     Findings: No erythema or rash.  Neurological:     General: No focal deficit present.     Mental Status: [name] alert and oriented to person, place, and time.     Sensory: Sensory deficit present.     Motor: Weakness present. No abnormal muscle tone.     Coordination: Coordination normal.     Gait: Gait abnormal.  Psychiatric:        Mood and Affect: Mood normal.        Behavior: Behavior normal.     Ortho Exam  Imaging: No results found.  Past Medical/Family/Surgical/Social History: Medications & Allergies reviewed per EMR, new medications updated. Patient Active Problem List   Diagnosis Date Noted   Chronic RUQ pain 07/31/2023   Irregular bowel habits 07/31/2023   Helicobacter pylori gastritis 05/14/2023   Abdominal pain 05/14/2023   Abnormal weight gain 05/14/2023   Abnormal weight loss 05/14/2023   Change in bowel habit 05/14/2023   Diarrhea 05/14/2023   Epigastric pain 05/14/2023   Flatulence, eructation and gas pain 05/14/2023   Right lower quadrant pain 05/14/2023   Acid reflux 05/14/2023   Gastroesophageal reflux disease 05/14/2023   Recurrent sinusitis 04/30/2023   Acute sinusitis 10/14/2022   Hx of anorexia nervosa 10/14/2022   Chronic rhinitis 09/19/2021   Depression    Acute pain of right shoulder 01/30/2021   Body mass index (BMI) 28.0-28.9, adult 01/02/2021   Elevated blood-pressure reading, without diagnosis of hypertension 11/04/2020   Spinal stenosis, cervical region 09/02/2020   Lumbar back pain with radiculopathy affecting right lower extremity 09/02/2020   Gastropathy 10/05/2019   S/P laparoscopic hysterectomy 06/29/2019   Cough variant asthma 05/14/2019    Essential hypertension 05/14/2019   GAD (generalized anxiety disorder) 10/21/2018   MDD (major depressive disorder) 10/21/2018   Skin picking habit 10/21/2018   Insomnia 08/07/2018   Anxiety 02/12/2017   Gestational hypertension 07/12/2016   Gestational hypertension w/o significant proteinuria in 3rd trimester 07/12/2016   Urinary frequency 09/09/2012   Biliary colic 08/30/2011   BLURRED VISION 11/22/2007   Past Medical History:  Diagnosis Date   Anxiety    panic attack   Cough variant asthma 05/14/2019   Depression    Gastropathy 10/05/2019   Migraine    S/P laparoscopic hysterectomy 06/29/2019   Family History  Problem Relation Age of Onset   Diabetes Mother    Hyperlipidemia Mother    Heart disease Mother    Asthma Mother    Allergies Mother    Stomach cancer Mother    Diabetes Father    Hyperlipidemia Father    Heart disease Father    Cancer Maternal Grandmother  colon and breast   Cancer Paternal Grandmother        colon   Past Surgical History:  Procedure Laterality Date   ABDOMINAL HYSTERECTOMY     BIOPSY  07/31/2023   Procedure: BIOPSY;  Surgeon: Unk Corinn Skiff, MD;  Location: [facility] ENDOSCOPY;  Service: Gastroenterology;;   CHOLECYSTECTOMY  10/02/2011   COLONOSCOPY WITH PROPOFOL  N/A 07/31/2023   Procedure: COLONOSCOPY WITH PROPOFOL ;  Surgeon: Unk Corinn Skiff, MD;  Location: [facility] ENDOSCOPY;  Service: Gastroenterology;  Laterality: N/A;   CYSTOSCOPY N/A 06/29/2019   Procedure: CYSTOSCOPY;  Surgeon: Delana Ted Morrison, DO;  Location: [facility];  Service: Gynecology;  Laterality: N/A;   ESOPHAGOGASTRODUODENOSCOPY (EGD) WITH PROPOFOL  N/A 07/31/2023   Procedure: ESOPHAGOGASTRODUODENOSCOPY (EGD) WITH PROPOFOL ;  Surgeon: Unk Corinn Skiff, MD;  Location: [facility] ENDOSCOPY;  Service: Gastroenterology;  Laterality: N/A;   TOTAL LAPAROSCOPIC HYSTERECTOMY WITH SALPINGECTOMY Bilateral 06/29/2019   Procedure: TOTAL LAPAROSCOPIC HYSTERECTOMY  WITH SALPINGECTOMY, poss open;  Surgeon: Delana Ted Morrison, DO;  Location: [facility];  Service: Gynecology;  Laterality: Bilateral;  smoke evac rep will be here [place]   UPPER GI ENDOSCOPY  2015   Social History   Occupational History   Not on file  Tobacco Use   Smoking status: Never    Passive exposure: Past   Smokeless tobacco: Never  Vaping Use   Vaping status: Never Used  Substance and Sexual Activity   Alcohol use: No   Drug use: No   Sexual activity: Yes    Birth control/protection: Diaphragm

## 2024-09-23 ENCOUNTER — Ambulatory Visit: Admitting: Orthopaedic Surgery

## 2024-09-23 ENCOUNTER — Encounter: Payer: Self-pay | Admitting: Orthopaedic Surgery

## 2024-09-23 DIAGNOSIS — R29898 Other symptoms and signs involving the musculoskeletal system: Secondary | ICD-10-CM

## 2024-09-23 DIAGNOSIS — M5442 Lumbago with sciatica, left side: Secondary | ICD-10-CM | POA: Diagnosis not present

## 2024-09-23 DIAGNOSIS — G8929 Other chronic pain: Secondary | ICD-10-CM | POA: Diagnosis not present

## 2024-09-23 NOTE — Progress Notes (Signed)
 The patient is a 44 year old female very well-known to us .  She has been dealing with chronic back pain for some time now but she has been having left lower extremity weakness with foot drop and also weak hip flexors and abductors.  There is been no known injury.  [name] coming in for follow-up today after having an MRI of her lumbar spine as well as left lower extremity nerve conduction studies.  She does have a device on her foot that helps her keep her foot straight.  On exam today she does demonstrate the ability to dorsiflex her left foot so is not fulminant full foot drop but there is certainly weakness.  She also has some weakness with left hip flexion.  Her left hip does move smoothly and fluidly with no blocks to rotation at all and no pain in the groin.  There is a little bit of pain to her ovation around the trochanteric area.   The MRI of her lumbar spine does show moderate facet arthritis at L4 for L5 but no overt nerve compression.  The nerve conduction studies were normal of the left lower extremity showing no nerve impingement.  At this point I would like to send her to her neurologist to make sure there is not something supratentorial that is leading to this weakness that she is experiencing.  We will also obtain an MRI of her left hip to rule out any type of ligament or tendon tears or muscle atrophy.  Will see her back after the MRI of her left hip.  She agrees with the treatment plan.

## 2024-09-24 ENCOUNTER — Other Ambulatory Visit: Payer: Self-pay

## 2024-09-24 DIAGNOSIS — M25552 Pain in left hip: Secondary | ICD-10-CM

## 2024-09-24 DIAGNOSIS — R29898 Other symptoms and signs involving the musculoskeletal system: Secondary | ICD-10-CM

## 2024-09-25 ENCOUNTER — Encounter: Payer: Self-pay | Admitting: Neurology

## 2024-09-28 ENCOUNTER — Encounter: Payer: Self-pay | Admitting: Orthopaedic Surgery

## 2024-10-01 ENCOUNTER — Other Ambulatory Visit

## 2024-10-02 ENCOUNTER — Ambulatory Visit
Admission: RE | Admit: 2024-10-02 | Discharge: 2024-10-02 | Disposition: A | Discharge status: Home / Self Care | Source: Ambulatory Visit | Attending: Orthopaedic Surgery | Admitting: Orthopaedic Surgery

## 2024-10-02 DIAGNOSIS — M25552 Pain in left hip: Secondary | ICD-10-CM

## 2024-10-07 ENCOUNTER — Ambulatory Visit: Admitting: Orthopaedic Surgery

## 2024-10-15 ENCOUNTER — Ambulatory Visit: Admitting: Orthopaedic Surgery

## 2024-10-20 ENCOUNTER — Other Ambulatory Visit: Payer: Self-pay | Admitting: Primary Care

## 2024-10-20 DIAGNOSIS — J45991 Cough variant asthma: Secondary | ICD-10-CM

## 2024-10-20 DIAGNOSIS — J329 Chronic sinusitis, unspecified: Secondary | ICD-10-CM

## 2024-10-20 NOTE — Telephone Encounter (Signed)
 Refill sent for DUPIXENT  to [facility]: [phone]  Dose: 300mg  [place] every 14 days   Last OV: 05/14/24 Provider: Dr. Darlean  Next OV: due November 2025, not yet scheduled   Routing to scheduling team for follow-up on appt scheduling  Aleck Puls, PharmD, BCPS Clinical Pharmacist  [facility]

## 2024-10-21 ENCOUNTER — Other Ambulatory Visit (HOSPITAL_COMMUNITY): Payer: Self-pay

## 2024-10-21 ENCOUNTER — Telehealth: Payer: Self-pay

## 2024-10-21 NOTE — Telephone Encounter (Signed)
 Attempted to call patient, no one answered so I left a voicemail to give us  a call back.

## 2024-10-21 NOTE — Telephone Encounter (Signed)
 Received notification from [facility] regarding a prior authorization for DUPIXENT . Authorization has been APPROVED from 10/21/24 to 10/21/25. Approval letter sent to scan center.  Unable to run test claim because pt must continue to fill through [facility]: [phone]  Authorization # [id number] Phone # [phone]

## 2024-10-21 NOTE — Telephone Encounter (Signed)
 Received pa renewal form for Dupixent . Submitted a Prior Authorization request to [facility] for DUPIXENT  via CoverMyMeds. Will update once we receive a response.  Key: [id number]

## 2024-10-26 ENCOUNTER — Ambulatory Visit: Payer: Self-pay | Admitting: Orthopaedic Surgery

## 2024-10-28 ENCOUNTER — Other Ambulatory Visit (INDEPENDENT_AMBULATORY_CARE_PROVIDER_SITE_OTHER): Payer: Self-pay | Admitting: Otolaryngology

## 2024-10-29 NOTE — Telephone Encounter (Signed)
 ATC 2x left VM sent LTR

## 2024-11-02 ENCOUNTER — Encounter: Payer: Self-pay | Admitting: Radiology

## 2024-11-21 ENCOUNTER — Telehealth: Admitting: Family Medicine

## 2024-11-21 DIAGNOSIS — H00012 Hordeolum externum right lower eyelid: Secondary | ICD-10-CM | POA: Diagnosis not present

## 2024-11-21 MED ORDER — NEOMYCIN-POLYMYXIN-DEXAMETH 3.5-10000-0.1 OP SUSP: 2.0000 [drp] | Freq: Four times a day (QID) | OPHTHALMIC | 0 refills | Status: AC

## 2024-11-21 NOTE — Progress Notes (Signed)
 We are sorry that you are not feeling well. Here is how we plan to help!  Based on what you have shared with me it looks like you have a stye.  A stye is an inflammation of the eyelid.  It is often a red, painful lump near the edge of the eyelid that may look like a boil or a pimple.  A stye develops when an infection occurs at the base of an eyelash.   We have made appropriate suggestions for you based upon your presentation: Simple styes can be treated without medical intervention.  Most styes either resolve spontaneously or resolve with simple home treatment by applying warm compresses or heated washcloth to the stye for about 10-15 minutes three to four times a day. This causes the stye to drain and resolve. and I have prescribed Maxitrol 3.04-999-1 Use 1-2 drops in affected eye every 6 hours while awake for 5 days. To Apply: Tilt your head back, look upward, and pull down the lower eyelid to make a pouch. Hold the dropper directly over your eye and place one drop into the pouch. Look downward and gently close your eyes for 1 to 2 minutes. Place one finger at the corner of your eye (near the nose) and apply gentle pressure. This will prevent the medication from draining out. Try not to blink and do not rub your eye. Repeat these steps if your dose is for more than 1 drop and for your other eye if so directed. The dosage is based on your medical condition and response to treatment.  HOME CARE:  Wash your hands often! Let the stye open on its own. Don't squeeze or open it. Don't rub your eyes. This can irritate your eyes and let in bacteria.  If you need to touch your eyes, wash your hands first. Don't wear eye makeup or contact lenses until the area has healed.  GET HELP RIGHT AWAY IF:  Your symptoms do not improve. You develop blurred or loss of vision. Your symptoms worsen (increased discharge, pain or redness).  Thank you for choosing an e-visit.  Your e-visit answers were reviewed by a  board certified advanced clinical practitioner to complete your personal care plan.  Depending upon the condition, your plan could have included both over the counter or prescription medications.  Please review your pharmacy choice.  Make sure the pharmacy is open so you can pick up prescription now.  If there is a problem, you may contact your provider through [employer] and have the prescription routed to another pharmacy.    Your safety is important to us .  If you have drug allergies check your prescription carefully.  For the next 24 hours you can use MyChart to ask questions about today's visit, request a non-urgent call back, or ask for a work or school excuse.  You will get an email in the next two days asking about your experience.  I hope you that your e-visit has been valuable and will speed your recovery.  I have spent 5 minutes in review of e-visit questionnaire, review and updating patient chart, medical decision making and response to patient.   Roosvelt Mater, PA-C

## 2024-11-24 ENCOUNTER — Ambulatory Visit: Admitting: Neurology

## 2024-11-24 ENCOUNTER — Other Ambulatory Visit

## 2024-11-24 ENCOUNTER — Encounter: Payer: Self-pay | Admitting: Neurology

## 2024-11-24 VITALS — BP 116/75 | HR 78 | Ht 66.0 in | Wt 186.0 lb

## 2024-11-24 DIAGNOSIS — R29898 Other symptoms and signs involving the musculoskeletal system: Secondary | ICD-10-CM | POA: Diagnosis not present

## 2024-11-24 DIAGNOSIS — R292 Abnormal reflex: Secondary | ICD-10-CM | POA: Diagnosis not present

## 2024-11-24 NOTE — Progress Notes (Signed)
 [facility] Clinic Note - Initial Visit   Date: 11/24/2024   Joanne Molina MRN: [id number] DOB: 04/02/1980   Dear Dr. Vernetta:  Thank you for your kind referral of Joanne Molina for consultation of left leg weakness. Although her history is well known to you, please allow us  to reiterate it for the purpose of our medical record. The patient was accompanied to the clinic by self.   Joanne Molina is a 44 y.o. right-handed female with depression/anxiety, asthma, s/p cervical surgery, and GERD presenting for evaluation of left leg weakness.   IMPRESSION/PLAN: Assessment & Plan Chronic left leg weakness and gait disturbance since summer of 2024.  Exam shows hyperreflexia in the legs with reduced ROM of left hip flexion, knee extension, and foot dorsiflexion/plantar flexion with only mildly reduced motor strength.  Prior testing includes MRI lumbar spine, MRI hip, and NCS/EMG of the left leg which has been unremarkable.  With her exam showing upper motor neuron findings, I will check for structural pathology (myelopathy) involving the thoracic and cervical spine.  She has history of cervical surgery in the past.  - MRI cervical and thoracic spine wwo contrast - Check ESR, CRP, vitamin B12, folate, copper, ceruloplasmin, zinc  2.  Involuntary movement of the left toes is suggestive of moving toe syndrome.  Unfortunately, medications are not very effective.  Will awaiy results of imaging to decide the next steps.   Further recommendations pending results.   ------------------------------------------------------------- History of present illness:  Discussed the use of AI scribe software for clinical note transcription with the patient, who gave verbal consent to proceed. History of Present Illness Left leg weakness began approximately a year ago, around the summer of 2024. Initially, the leg felt weaker than the right, especially when climbing stairs, and sometimes  felt like it would give way. By June 2025, she experienced difficulty extending her leg fully when taking a step, leading to issues with clearing the ground.  She has undergone a MRI lumbar, MRI hip, and NCS/EMG of the left leg which did not show any abnormalities to explain her symptoms.   She also reports having constant involuntary movements in the foot, described as 'like worms crawling on your skin'. No falls or significant balance issues, but her gait is altered, with the left leg not moving in sync with the right.  Occasional pain runs down the outer portion of the left leg, particularly after walking a lot, but it does not significantly limit her mobility. She has tried physical therapy, which did not alleviate her symptoms and caused increased pain. No numbness or tingling in the left leg.  Family history is notable for her mother having degenerative back disease with multiple back surgeries. She does not smoke or drink alcohol. 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  No weakness in the hands, difficulty with arms, speech, or swallowing. No weakness in the right leg. Occasionally experiences cramps in the left leg.  Out-side paper records, electronic medical record, and images have been reviewed where available and summarized as:   MRI lumbar spine 09/15/2024:  Mild disc desiccation facet arthrosis. There is no focal extrusion, foraminal or spinal stenosis. Mild caudal foraminal narrowing is present at L4-5 without significant foraminal or spinal stenosis.  NCS/EMG of the left leg 09/08/2024 at [facility]:  Normal  Lab Results  Component Value Date   TSH 1.62 03/08/2023   No results found for: ESRSEDRATE, POCTSEDRATE  Past Medical History:  Diagnosis Date   Anxiety    panic attack   Cough variant asthma 05/14/2019   Depression    Gastropathy 10/05/2019   Migraine    S/P laparoscopic hysterectomy 06/29/2019    Past Surgical History:   Procedure Laterality Date   ABDOMINAL HYSTERECTOMY     BIOPSY  07/31/2023   Procedure: BIOPSY;  Surgeon: Unk Corinn Skiff, MD;  Location: [facility] ENDOSCOPY;  Service: Gastroenterology;;   CHOLECYSTECTOMY  10/02/2011   COLONOSCOPY WITH PROPOFOL  N/A 07/31/2023   Procedure: COLONOSCOPY WITH PROPOFOL ;  Surgeon: Unk Corinn Skiff, MD;  Location: [facility] ENDOSCOPY;  Service: Gastroenterology;  Laterality: N/A;   CYSTOSCOPY N/A 06/29/2019   Procedure: CYSTOSCOPY;  Surgeon: Delana Ted Morrison, DO;  Location: [facility];  Service: Gynecology;  Laterality: N/A;   ESOPHAGOGASTRODUODENOSCOPY (EGD) WITH PROPOFOL  N/A 07/31/2023   Procedure: ESOPHAGOGASTRODUODENOSCOPY (EGD) WITH PROPOFOL ;  Surgeon: Unk Corinn Skiff, MD;  Location: [facility] ENDOSCOPY;  Service: Gastroenterology;  Laterality: N/A;   TOTAL LAPAROSCOPIC HYSTERECTOMY WITH SALPINGECTOMY Bilateral 06/29/2019   Procedure: TOTAL LAPAROSCOPIC HYSTERECTOMY WITH SALPINGECTOMY, poss open;  Surgeon: Delana Ted Morrison, DO;  Location:  SUR[facility]ERY CENTER;  Service: Gynecology;  Laterality: Bilateral;  smoke evac rep will be here [place]   UPPER GI ENDOSCOPY  2015     Medications:  Outpatient Encounter Medications as of 11/24/2024  Medication Sig   albuterol  (VENTOLIN  HFA) 108 (90 Base) MCG/ACT inhaler Inhale 1 puff into the lungs every 6 (six) hours as needed for wheezing or shortness of breath.   ALPRAZolam  (XANAX ) 1 MG tablet TAKE 1 TABLET BY MOUTH TWICE A DAY AS NEEDED FOR ANXIETY   bisoprolol  (ZEBETA ) 5 MG tablet TAKE 1 TABLET (5 MG TOTAL) BY MOUTH DAILY.   budesonide -formoterol  (SYMBICORT ) 160-4.5 MCG/ACT inhaler Inhale 2 puffs into the lungs 2 (two) times daily.   cetirizine  (ZYRTEC ) 10 MG tablet TAKE 1 TABLET BY MOUTH EVERY DAY   dicyclomine  (BENTYL ) 10 MG capsule Take 1 capsule (10 mg total) by mouth 4 (four) times daily -  before meals and at bedtime.   Dupilumab  (DUPIXENT ) 300 MG/2ML SOAJ Inject 300 mg into the skin  every 14 (fourteen) days. ** Please schedule appointment for future refills. **   famotidine  (PEPCID ) 40 MG tablet Take 40 mg by mouth daily.   fluticasone  (FLONASE ) 50 MCG/ACT nasal spray SPRAY 2 SPRAYS INTO EACH NOSTRIL EVERY DAY   gabapentin  (NEURONTIN ) 300 MG capsule TAKE 1 CAPSULE BY MOUTH THREE TIMES A DAY (Patient taking differently: Take 300 mg by mouth 3 (three) times daily. TAKE 1 CAPSULE BY MOUTH THREE TIMES A DAY)   montelukast  (SINGULAIR ) 10 MG tablet Take 1 tablet (10 mg total) by mouth at bedtime.   sertraline  (ZOLOFT ) 100 MG tablet Take 3 tablets (300 mg total) by mouth daily.   neomycin -polymyxin b-dexamethasone  (MAXITROL) 3.5-10000-0.1 SUSP Place 2 drops into the right eye every 6 (six) hours for 5 days.   No facility-administered encounter medications on file as of 11/24/2024.    Allergies:  Allergies  Allergen Reactions   Erythromycin Nausea Only    Family History: Family History  Problem Relation Age of Onset   Diabetes Mother    Hyperlipidemia Mother    Heart disease Mother    Asthma Mother    Allergies Mother    Stomach cancer Mother    Diabetes Father    Hyperlipidemia Father    Heart disease Father    Cancer Maternal Grandmother  colon and breast   Cancer Paternal Grandmother        colon    Social History: Social History   Tobacco Use   Smoking status: Never    Passive exposure: Past   Smokeless tobacco: Never  Vaping Use   Vaping status: Never Used  Substance Use Topics   Alcohol use: No   Drug use: No   Social History   Social History Narrative   Are you right handed or left handed? Right Handed   Are you currently employed ? Yes   What is your current occupation?   Do you live at home alone? No    Who lives with you?    What type of home do you live in: 1 story or 2 story? Lives in a two story home        Vital Signs:  BP 116/75   Pulse 78   Ht 5' 6 (1.676 m)   Wt 186 lb (84.4 kg)   LMP 06/02/2019 (Approximate)    SpO2 97%   BMI 30.02 kg/m   Neurological Exam: MENTAL STATUS including orientation to time, place, person, recent and remote memory, attention span and concentration, language, and fund of knowledge is normal.  Speech is not dysarthric.  CRANIAL NERVES: II:  No visual field defects.     III-IV-VI: Pupils equal round and reactive to light.  Normal conjugate, extra-ocular eye movements in all directions of gaze.  No nystagmus.  No ptosis.   V:  Normal facial sensation.    VII:  Normal facial symmetry and movements.   VIII:  Normal hearing and vestibular function.   IX-X:  Normal palatal movement.   XI:  Normal shoulder shrug and head rotation.   XII:  Normal tongue strength and range of motion, no deviation or fasciculation.  MOTOR:  No atrophy or fasciculations.  There is a very low amplitude extension/flexion of the left toes, which is present at rest and dissipates with toe movement.  No pronator drift.   Upper Extremity:  Right  Left  Deltoid  5/5   5/5   Biceps  5/5   5/5   Triceps  5/5   5/5   Wrist extensors  5/5   5/5   Wrist flexors  5/5   5/5   Finger extensors  5/5   5/5   Finger flexors  5/5   5/5   Dorsal interossei  5/5   5/5   Abductor pollicis  5/5   5/5   Tone (Ashworth scale)  0  0   Lower Extremity:  Right  Left  Hip flexors  5/5   5-/5   Hip extensors  5/5   5/5   Adductor 5/5  5/5  Abductor 5/5  5/5  Knee flexors  5/5   5-/5   Knee extensors  5/5   5-/5   Dorsiflexors  5/5   5-/5   Plantarflexors  5/5   5-/5   Toe extensors  5/5   5-/5   Toe flexors  5/5   5-/5   Tone (Ashworth scale)  0  0   MSRs:                                           Right        Left brachioradialis 2+  2+  biceps 2+  2+  triceps  2+  2+  patellar 3+  3+  ankle jerk 2+  2+  plantar response up  up   SENSORY:  Normal and symmetric perception of light touch, pinprick, and vibration.  Romberg's sign absent.   COORDINATION/GAIT: Normal finger-to- nose-finger.  Intact rapid  alternating movements bilaterally.  Gait shows mild dragging of the left foot and leg.  [name] able to perform stressed and tandem gait.      Thank you for allowing me to participate in patient's care.  If I can answer any additional questions, I would be pleased to do so.    Sincerely,    Nolene Rocks K. Tobie, DO

## 2024-12-01 LAB — C-REACTIVE PROTEIN: CRP: <3 mg/L (ref <8.0)

## 2024-12-01 LAB — B12 AND FOLATE PANEL
Folate: 5.4 ng/mL — ABNORMAL LOW
Vitamin B-12: 256 pg/mL (ref 200–1100)

## 2024-12-01 LAB — SEDIMENTATION RATE: Sed Rate: 6 mm/h (ref 0–20)

## 2024-12-01 LAB — COPPER, SERUM: Copper: 136 ug/dL (ref 70–175)

## 2024-12-01 LAB — CERULOPLASMIN: Ceruloplasmin: 29 mg/dL (ref 14–48)

## 2024-12-01 LAB — ZINC: Zinc: 84 ug/dL (ref 60–130)

## 2024-12-02 ENCOUNTER — Ambulatory Visit: Payer: Self-pay | Admitting: Neurology

## 2024-12-10 ENCOUNTER — Inpatient Hospital Stay: Admission: RE | Admit: 2024-12-10 | Discharge: 2024-12-10 | Attending: Neurology

## 2024-12-10 DIAGNOSIS — R292 Abnormal reflex: Secondary | ICD-10-CM

## 2024-12-10 DIAGNOSIS — R29898 Other symptoms and signs involving the musculoskeletal system: Secondary | ICD-10-CM

## 2024-12-10 MED ORDER — GADOPICLENOL 0.5 MMOL/ML IV SOLN: 10.0000 mL | Freq: Once | INTRAVENOUS | Status: DC | PRN

## 2024-12-10 MED ORDER — GADOPICLENOL 0.5 MMOL/ML IV SOLN: 9.0000 mL | Freq: Once | INTRAVENOUS | Status: DC | PRN

## 2024-12-10 MED ORDER — GADOPICLENOL 0.5 MMOL/ML IV SOLN
10.0000 mL | Freq: Once | INTRAVENOUS | Status: AC | PRN
  Administered 2024-12-10: 9 mL via INTRAVENOUS

## 2024-12-14 ENCOUNTER — Other Ambulatory Visit: Payer: Self-pay | Admitting: Internal Medicine

## 2024-12-14 DIAGNOSIS — J329 Chronic sinusitis, unspecified: Secondary | ICD-10-CM

## 2024-12-14 DIAGNOSIS — J45991 Cough variant asthma: Secondary | ICD-10-CM

## 2024-12-15 NOTE — Telephone Encounter (Signed)
 Pt requesting refill of specialty medication - routing to Rx team to advise.

## 2024-12-16 NOTE — Telephone Encounter (Signed)
 Refill sent for DUPIXENT  to [facility]: [phone]  Dose: 300mg  [place] every 14 days  Last OV: 05/14/24 Provider: Dr. Darlean  Next OV: overdue - due Nov 2025  Routing to scheduling team for follow-up on appt scheduling  Aleck Puls, PharmD, BCPS Clinical Pharmacist  [facility]

## 2024-12-22 ENCOUNTER — Ambulatory Visit: Admitting: Neurology

## 2024-12-22 NOTE — Telephone Encounter (Signed)
 Attempted to call patient. Left voicemail for patient to call to get scheduled.

## 2025-01-01 ENCOUNTER — Ambulatory Visit
Admission: RE | Admit: 2025-01-01 | Discharge: 2025-01-01 | Disposition: A | Discharge status: Home / Self Care | Source: Ambulatory Visit | Attending: Neurology | Admitting: Neurology

## 2025-01-01 DIAGNOSIS — R29898 Other symptoms and signs involving the musculoskeletal system: Secondary | ICD-10-CM

## 2025-01-01 DIAGNOSIS — R292 Abnormal reflex: Secondary | ICD-10-CM

## 2025-01-01 MED ORDER — GADOPICLENOL 0.5 MMOL/ML IV SOLN
10.0000 mL | Freq: Once | INTRAVENOUS | Status: AC | PRN
  Administered 2025-01-01: 8 mL via INTRAVENOUS

## 2025-01-07 NOTE — Telephone Encounter (Signed)
 Attempted to call patient. Left voicemail for patient to call to get scheduled. Sent MyChart message as well.

## 2025-01-14 ENCOUNTER — Ambulatory Visit: Payer: Self-pay | Admitting: Neurology

## 2025-02-10 ENCOUNTER — Ambulatory Visit: Admitting: Primary Care

## 2025-03-02 ENCOUNTER — Ambulatory Visit: Payer: Self-pay | Admitting: Neurology
